# Patient Record
Sex: Female | Born: 1975 | Race: White | Hispanic: No | Marital: Married | Smoking: Never smoker
Health system: Southern US, Community
[De-identification: ages and names within clinical notes are randomized; demographics above are authoritative.]

## PROBLEM LIST (undated history)

## (undated) ENCOUNTER — Ambulatory Visit: Source: Home / Self Care

## (undated) DIAGNOSIS — Z9889 Other specified postprocedural states: Secondary | ICD-10-CM

## (undated) DIAGNOSIS — K5790 Diverticulosis of intestine, part unspecified, without perforation or abscess without bleeding: Secondary | ICD-10-CM

## (undated) DIAGNOSIS — R112 Nausea with vomiting, unspecified: Secondary | ICD-10-CM

## (undated) DIAGNOSIS — F329 Major depressive disorder, single episode, unspecified: Secondary | ICD-10-CM

## (undated) DIAGNOSIS — D509 Iron deficiency anemia, unspecified: Secondary | ICD-10-CM

## (undated) DIAGNOSIS — F32A Depression, unspecified: Secondary | ICD-10-CM

## (undated) DIAGNOSIS — F419 Anxiety disorder, unspecified: Secondary | ICD-10-CM

## (undated) DIAGNOSIS — K449 Diaphragmatic hernia without obstruction or gangrene: Secondary | ICD-10-CM

## (undated) DIAGNOSIS — K219 Gastro-esophageal reflux disease without esophagitis: Secondary | ICD-10-CM

## (undated) HISTORY — PX: COLONOSCOPY: SHX174

## (undated) HISTORY — DX: Major depressive disorder, single episode, unspecified: F32.9

## (undated) HISTORY — PX: TUBAL LIGATION: SHX77

## (undated) HISTORY — PX: ESOPHAGOGASTRODUODENOSCOPY: SHX1529

## (undated) HISTORY — DX: Iron deficiency anemia, unspecified: D50.9

## (undated) HISTORY — DX: Anxiety disorder, unspecified: F41.9

## (undated) HISTORY — DX: Depression, unspecified: F32.A

---
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
## 2017-07-08 ENCOUNTER — Ambulatory Visit (INDEPENDENT_AMBULATORY_CARE_PROVIDER_SITE_OTHER): Payer: BLUE CROSS/BLUE SHIELD | Admitting: Family Medicine

## 2017-07-08 ENCOUNTER — Encounter: Payer: Self-pay | Admitting: Family Medicine

## 2017-07-08 ENCOUNTER — Other Ambulatory Visit: Payer: Self-pay

## 2017-07-08 VITALS — BP 110/60 | HR 111 | Temp 98.1°F | Resp 16 | Ht 60.0 in | Wt 189.5 lb

## 2017-07-08 DIAGNOSIS — Z114 Encounter for screening for human immunodeficiency virus [HIV]: Secondary | ICD-10-CM

## 2017-07-08 DIAGNOSIS — Z79899 Other long term (current) drug therapy: Secondary | ICD-10-CM | POA: Diagnosis not present

## 2017-07-08 DIAGNOSIS — Z6835 Body mass index (BMI) 35.0-35.9, adult: Secondary | ICD-10-CM

## 2017-07-08 DIAGNOSIS — E669 Obesity, unspecified: Secondary | ICD-10-CM | POA: Diagnosis not present

## 2017-07-08 DIAGNOSIS — F321 Major depressive disorder, single episode, moderate: Secondary | ICD-10-CM | POA: Insufficient documentation

## 2017-07-08 DIAGNOSIS — F339 Major depressive disorder, recurrent, unspecified: Secondary | ICD-10-CM | POA: Diagnosis not present

## 2017-07-08 DIAGNOSIS — Z113 Encounter for screening for infections with a predominantly sexual mode of transmission: Secondary | ICD-10-CM | POA: Diagnosis not present

## 2017-07-08 DIAGNOSIS — R5383 Other fatigue: Secondary | ICD-10-CM | POA: Diagnosis not present

## 2017-07-08 DIAGNOSIS — F419 Anxiety disorder, unspecified: Secondary | ICD-10-CM | POA: Insufficient documentation

## 2017-07-08 HISTORY — DX: Encounter for screening for human immunodeficiency virus (HIV): Z11.4

## 2017-07-08 HISTORY — DX: Other long term (current) drug therapy: Z79.899
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
## 2017-07-09 LAB — LIPID PANEL
Cholesterol: 136 mg/dL (ref <200)
HDL: 52 mg/dL (ref >50)
LDL Cholesterol (Calc): 68 mg/dL (calc)
Non-HDL Cholesterol (Calc): 84 mg/dL (calc) (ref <130)
Total CHOL/HDL Ratio: 2.6 (calc) (ref <5.0)
Triglycerides: 79 mg/dL (ref <150)

## 2017-07-09 LAB — COMPLETE METABOLIC PANEL WITH GFR
AG Ratio: 1.6 (calc) (ref 1.0–2.5)
ALBUMIN MSPROF: 3.9 g/dL (ref 3.6–5.1)
ALT: 17 U/L (ref 6–29)
AST: 12 U/L (ref 10–30)
Alkaline phosphatase (APISO): 68 U/L (ref 33–115)
BUN: 12 mg/dL (ref 7–25)
CALCIUM: 9.1 mg/dL (ref 8.6–10.2)
CO2: 29 mmol/L (ref 20–32)
CREATININE: 1 mg/dL (ref 0.50–1.10)
Chloride: 109 mmol/L (ref 98–110)
GFR, EST NON AFRICAN AMERICAN: 70 mL/min/{1.73_m2} (ref >=60)
GFR, Est African American: 81 mL/min/{1.73_m2} (ref >=60)
GLOBULIN: 2.5 g/dL (ref 1.9–3.7)
Glucose, Bld: 76 mg/dL (ref 65–139)
Potassium: 4 mmol/L (ref 3.5–5.3)
SODIUM: 141 mmol/L (ref 135–146)
Total Bilirubin: 0.5 mg/dL (ref 0.2–1.2)
Total Protein: 6.4 g/dL (ref 6.1–8.1)

## 2017-07-09 LAB — HEPATIC FUNCTION PANEL
AG Ratio: 1.6 (calc) (ref 1.0–2.5)
ALBUMIN MSPROF: 3.9 g/dL (ref 3.6–5.1)
ALT: 17 U/L (ref 6–29)
AST: 12 U/L (ref 10–30)
Alkaline phosphatase (APISO): 68 U/L (ref 33–115)
Bilirubin, Direct: 0.1 mg/dL (ref 0.0–0.2)
Globulin: 2.5 g/dL (calc) (ref 1.9–3.7)
Indirect Bilirubin: 0.4 mg/dL (calc) (ref 0.2–1.2)
Total Bilirubin: 0.5 mg/dL (ref 0.2–1.2)
Total Protein: 6.4 g/dL (ref 6.1–8.1)

## 2017-07-09 LAB — CBC WITH DIFFERENTIAL/PLATELET
Basophils Absolute: 72 cells/uL (ref 0–200)
Basophils Relative: 1.1 %
EOS PCT: 2.9 %
Eosinophils Absolute: 189 cells/uL (ref 15–500)
HEMATOCRIT: 41.9 % (ref 35.0–45.0)
HEMOGLOBIN: 14.1 g/dL (ref 11.7–15.5)
LYMPHS ABS: 2100 {cells}/uL (ref 850–3900)
MCH: 29.7 pg (ref 27.0–33.0)
MCHC: 33.7 g/dL (ref 32.0–36.0)
MCV: 88.2 fL (ref 80.0–100.0)
MONOS PCT: 8.5 %
MPV: 11.2 fL (ref 7.5–12.5)
Neutro Abs: 3588 cells/uL (ref 1500–7800)
Neutrophils Relative %: 55.2 %
Platelets: 253 10*3/uL (ref 140–400)
RBC: 4.75 10*6/uL (ref 3.80–5.10)
RDW: 12.2 % (ref 11.0–15.0)
Total Lymphocyte: 32.3 %
WBC mixed population: 553 cells/uL (ref 200–950)
WBC: 6.5 10*3/uL (ref 3.8–10.8)

## 2017-07-09 LAB — RPR: RPR Ser Ql: NONREACTIVE

## 2017-07-09 LAB — HEPATITIS PANEL, ACUTE
HEP A IGM: NONREACTIVE
HEP B C IGM: NONREACTIVE
HEP C AB: NONREACTIVE
Hepatitis B Surface Ag: NONREACTIVE
SIGNAL TO CUT-OFF: 0.01 (ref <1.00)

## 2017-07-09 LAB — VITAMIN B12: VITAMIN B 12: 427 pg/mL (ref 200–1100)

## 2017-07-09 LAB — TSH: TSH: 2.01 m[IU]/L

## 2017-07-09 LAB — VITAMIN D 25 HYDROXY (VIT D DEFICIENCY, FRACTURES): Vit D, 25-Hydroxy: 24 ng/mL — ABNORMAL LOW (ref 30–100)

## 2017-07-09 LAB — HIV ANTIBODY (ROUTINE TESTING W REFLEX): HIV 1&2 Ab, 4th Generation: NONREACTIVE

## 2017-07-12 ENCOUNTER — Telehealth (HOSPITAL_COMMUNITY): Payer: Self-pay

## 2017-07-12 NOTE — Telephone Encounter (Signed)
[facility]  by Dr. Vanetta ShawlHisada through staff email.    Patient was taking her son to the barber shop and was not able to speak.  Patient reports that I can call ber back tomorrow.

## 2017-07-14 ENCOUNTER — Encounter: Payer: Self-pay | Admitting: Family Medicine

## 2017-07-28 ENCOUNTER — Telehealth: Payer: Self-pay

## 2017-07-28 NOTE — Telephone Encounter (Signed)
[facility] - left message.  

## 2017-08-10 ENCOUNTER — Telehealth (HOSPITAL_COMMUNITY): Payer: Self-pay

## 2017-08-10 NOTE — Telephone Encounter (Signed)
[facility] - left message.  

## 2017-08-19 ENCOUNTER — Encounter: Payer: BLUE CROSS/BLUE SHIELD | Admitting: Family Medicine

## 2017-09-01 ENCOUNTER — Telehealth: Payer: Self-pay

## 2017-09-01 NOTE — Telephone Encounter (Signed)
[facility] - left message.  

## 2017-09-14 ENCOUNTER — Telehealth: Payer: Self-pay | Admitting: Clinical

## 2017-09-14 NOTE — Telephone Encounter (Signed)
Patricia Becker called returning call from Patricia Becker about [facility] services.  This [facility] specialist informed her that Patricia not available but this [facility] specialist could assist her.  Patricia Becker is interested in services for ongoing therapy and medication management.  Patricia Becker is not available to complete initial intake assessment since she is driving right now.  This [facility] specialist scheduled phone appointment this Friday between 11am/11:30am 09/17/17 to complete initial assessment.

## 2017-09-17 ENCOUNTER — Encounter (INDEPENDENT_AMBULATORY_CARE_PROVIDER_SITE_OTHER): Payer: Self-pay

## 2017-09-17 ENCOUNTER — Telehealth: Payer: Self-pay | Admitting: Clinical

## 2017-09-17 DIAGNOSIS — F339 Major depressive disorder, recurrent, unspecified: Secondary | ICD-10-CM

## 2017-09-17 DIAGNOSIS — F419 Anxiety disorder, unspecified: Secondary | ICD-10-CM
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
## 2017-09-20 ENCOUNTER — Encounter (HOSPITAL_COMMUNITY): Payer: Self-pay | Admitting: Psychiatry

## 2017-09-22 NOTE — Telephone Encounter (Signed)
This encounter was created in error - please disregard.
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
## 2017-09-23 ENCOUNTER — Encounter (INDEPENDENT_AMBULATORY_CARE_PROVIDER_SITE_OTHER): Payer: Self-pay

## 2017-09-30 ENCOUNTER — Encounter (INDEPENDENT_AMBULATORY_CARE_PROVIDER_SITE_OTHER): Payer: Self-pay

## 2017-09-30 ENCOUNTER — Telehealth: Payer: Self-pay | Admitting: Clinical

## 2017-09-30 DIAGNOSIS — F419 Anxiety disorder, unspecified: Secondary | ICD-10-CM

## 2017-09-30 DIAGNOSIS — F339 Major depressive disorder, recurrent, unspecified: Secondary | ICD-10-CM

## 2017-09-30 NOTE — BH Specialist Note (Signed)
[facility] Telephone Follow-up  MRN: [id number] NAME: Patricia Becker Date: 09/30/17  Start time: 9:19am End time: 9:29am Total time: 10 min Call number: 2/6  Reason for call today: Reason for Contact: PHQ9-2 weeks  PHQ-9 Scores:  Depression screen [facility] 2/9 09/17/2017 07/08/2017  Decreased Interest 1 0  Down, Depressed, Hopeless 1 1  PHQ - 2 Score 2 1  Altered sleeping 1 -  Tired, decreased energy 2 -  Change in appetite 2 -  Feeling bad or failure about yourself  2 -  Trouble concentrating 2 -  Moving slowly or fidgety/restless 1 -  Suicidal thoughts 0 -  PHQ-9 Score 12 -  Difficult doing work/chores Very difficult -   GAD-7 Scores:  GAD 7 : Generalized Anxiety Score 09/17/2017  Nervous, Anxious, on Edge 3  Control/stop worrying 2  Worry too much - different things 3  Trouble relaxing 3  Restless 2  Easily annoyed or irritable 3  Afraid - awful might happen 1  Total GAD 7 Score 17  Anxiety Difficulty Somewhat difficult    Stress Current stressors: Current Stressors: Other (Comment)(Busy work & family schedule, difficulty practicing any techniques to help her relax) Sleep:   Appetite:   Coping ability:   Patient taking medications as prescribed: Patient taking medications as prescribed: Yes  Current medications:  Outpatient Encounter Medications as of 09/30/2017  Medication Sig  . b complex vitamins tablet Take 1 tablet by mouth daily.  Marland Kitchen buPROPion (WELLBUTRIN XL) 150 MG 24 hr tablet Take 150 mg by mouth every morning.  . busPIRone (BUSPAR) 5 MG tablet Take 5 mg by mouth 2 (two) times daily.  . citalopram (CELEXA) 40 MG tablet Take 40 mg by mouth daily.  . Multiple Vitamin (MULTIVITAMIN) tablet Take 1 tablet by mouth daily.  . naproxen sodium (ALEVE) 220 MG tablet Take 220 mg by mouth daily as needed.  . polyethylene glycol (MIRALAX / GLYCOLAX) packet Take 17 g by mouth daily as needed.   No facility-administered encounter medications on file as of  09/30/2017.     Goals, Interventions and Follow-up Plan Goals: Increase adequate support systems for patient/family  Goals: Increase adequate support systems for patient/family  Patient's Specific Goals  - getting on the right medication - psychotherapy - even 1x/mo - Possibly trauma therapy -healthier eating habits  Interventions: Link to [facility] Follow-up Plan: Refer to Psychiatrist for Medication Management  Summary:  Patricia Becker reported that she will not be able to make her appointment with Dr. Vanetta Shawl on 10/19/17 so this [facility] specialist assisted in rescheduling her appointment to Friday 10/29/17, arrival time at 10am,11am appointment with Dr. Vanetta Shawl.  Patricia Becker reported she continues to be stressed and takes things one day at a time.  She agreed to look at one of the grounding techniques sent to her through MyChart to try to practice.  Mykenzie Ebanks Ed Blalock, LCSW

## 2017-10-15 ENCOUNTER — Encounter: Payer: Self-pay | Admitting: Family Medicine

## 2017-10-18 ENCOUNTER — Telehealth: Payer: Self-pay

## 2017-10-18 NOTE — Telephone Encounter (Signed)
Writer left message on 10-13-2017 and 10-18-2017.

## 2017-10-19 ENCOUNTER — Ambulatory Visit (HOSPITAL_COMMUNITY): Payer: BLUE CROSS/BLUE SHIELD | Admitting: Psychiatry

## 2017-10-21 ENCOUNTER — Other Ambulatory Visit: Payer: Self-pay

## 2017-10-21 ENCOUNTER — Encounter: Payer: Self-pay | Admitting: Family Medicine

## 2017-10-21 ENCOUNTER — Ambulatory Visit (INDEPENDENT_AMBULATORY_CARE_PROVIDER_SITE_OTHER): Payer: BLUE CROSS/BLUE SHIELD | Admitting: Family Medicine

## 2017-10-21 VITALS — BP 114/72 | HR 88 | Temp 98.1°F | Resp 16 | Ht 60.0 in | Wt 190.0 lb

## 2017-10-21 DIAGNOSIS — Z79899 Other long term (current) drug therapy: Secondary | ICD-10-CM | POA: Diagnosis not present

## 2017-10-21 DIAGNOSIS — F339 Major depressive disorder, recurrent, unspecified: Secondary | ICD-10-CM | POA: Diagnosis not present

## 2017-10-21 DIAGNOSIS — F419 Anxiety disorder, unspecified: Secondary | ICD-10-CM | POA: Diagnosis not present

## 2017-10-21 NOTE — Progress Notes (Signed)
Patient ID: Patricia Becker, female    DOB: 1975/09/28, 42 y.o.   MRN: [id number]  Chief Complaint  Patient presents with  . Medication Management    Allergies Demerol [meperidine hcl] and Aspirin  Subjective:   Patricia Becker is a 42 y.o. female who presents to [facility] today.  HPI Patricia Becker presents today to discuss her mood.  She reports that she has been taking Wellbutrin XL 150 mg, Celexa 40 mg, and BuSpar 5 mg twice a day.  She reports that she initially did have several appointments with psychiatry but had to cancel them due to her work schedule.  She has an appointment scheduled for 621/2019 but is going to have to cancel that appointment.  She is still having some anxiety and depression and mood instability but her mood is better than it was in February, per her report and her husband's report.  She has been consistently taking her medicines for the most part but does reports she does occasionally miss the medication for several days.  She reports that when she misses all the medications that she feels very irritable and easily aggravated.  She denies any suicidal or homicidal ideations.  She has been diagnosed with anxiety, depression, and PTSD.  She still would like to see a psychiatrist but would like to work on weaning down some of the medication so she can be put on some new medications which may give her more benefit.  She is functioning well at work.  Her appetite is good.  She denies any auditory or visual hallucinations.  Her husband reports that he can tell when she misses her medications because she is more irritable.  When she misses the medication she does not just missed 1 of them but she misses all of them for several days.  She denies having any withdrawal symptoms from missing the doses.   Past Medical History:  Diagnosis Date  . Anxiety   . Depression     Past Surgical History:  Procedure Laterality Date  . CESAREAN SECTION    . COLONOSCOPY     2017, normal.   . KNEE SURGERY  1981  . TUBAL LIGATION      Family History  Problem Relation Age of Onset  . Colon polyps Mother   . Suicidality Father   . Hypertension Sister   . Colon cancer Maternal Grandmother   . Drug abuse Other      Social History   Socioeconomic History  . Marital status: Married    Spouse name: Not on file  . Number of children: Not on file  . Years of education: Not on file  . Highest education level: Not on file  Occupational History  . Not on file  Social Needs  . Financial resource strain: Not on file  . Food insecurity:    Worry: Not on file    Inability: Not on file  . Transportation needs:    Medical: Not on file    Non-medical: Not on file  Tobacco Use  . Smoking status: Never Smoker  . Smokeless tobacco: Never Used  Substance and Sexual Activity  . Alcohol use: Yes    Alcohol/week: 1.2 oz    Types: 2 Glasses of wine per week  . Drug use: No  . Sexual activity: Yes    Birth control/protection: Surgical  Lifestyle  . Physical activity:    Days per week: Not on file    Minutes per session: Not on file  .  Stress: Not on file  Relationships  . Social connections:    Talks on phone: Not on file    Gets together: Not on file    Attends religious service: Not on file    Active member of club or organization: Not on file    Attends meetings of clubs or organizations: Not on file    Relationship status: Not on file  Other Topics Concern  . Not on file  Social History Narrative   [place] Had lived there for over 20 years. Moved with husband, son, and daughter.    Daugther is 42 y/o. Son is 42 y/o.   Works at [employer].    Was able to buy a house.    Married for over 16 years, second marriage, has two other children from that marriage.       Step father died, the actual father that raised her, on her birthday in 2016.    Eats all food groups.   Wears seatbelt.    Is a member of [employer].    Current Outpatient Medications on File Prior to Visit  Medication Sig Dispense Refill  . b complex vitamins tablet Take 1 tablet by mouth daily.    Marland Kitchen. buPROPion (WELLBUTRIN XL) 150 MG 24 hr tablet Take 150 mg by mouth every morning.  0  . busPIRone (BUSPAR) 5 MG tablet Take 5 mg by mouth 2 (two) times daily.    . citalopram (CELEXA) 40 MG tablet Take 40 mg by mouth daily.    . Multiple Vitamin (MULTIVITAMIN) tablet Take 1 tablet by mouth daily.    . naproxen sodium (ALEVE) 220 MG tablet Take 220 mg by mouth daily as needed.    . polyethylene glycol (MIRALAX / GLYCOLAX) packet Take 17 g by mouth daily as needed.     No current facility-administered medications on file prior to visit.     Review of Systems  Constitutional: Positive for fatigue. Negative for activity change, appetite change and fever.  Eyes: Negative for visual disturbance.  Respiratory: Negative for cough, chest tightness and shortness of breath.   Cardiovascular: Negative for chest pain, palpitations and leg swelling.  Gastrointestinal: Negative for abdominal pain, nausea and vomiting.  Genitourinary: Negative for dysuria, frequency and urgency.  Neurological: Negative for dizziness, syncope and light-headedness.  Hematological: Negative for adenopathy.  Psychiatric/Behavioral: Positive for agitation. Negative for behavioral problems, dysphoric mood, self-injury and suicidal ideas. The patient is nervous/anxious.      Objective:   BP 114/72 (BP Location: Left Arm, Patient Position: Sitting, Cuff Size: Large)   Pulse 88   Temp 98.1 F (36.7 C) (Temporal)   Resp 16   Ht 5' (1.524 m)   Wt 190 lb (86.2 kg)   SpO2 98%   BMI 37.11 kg/m   Physical Exam  Constitutional: She appears well-developed and well-nourished.  Cardiovascular: Normal rate, regular rhythm and normal heart sounds.  Pulmonary/Chest: Effort normal and breath sounds normal.  Skin: Skin is warm and dry. No rash noted.  Psychiatric: She  has a normal mood and affect. Her behavior is normal. Judgment and thought content normal.    Depression screen [facility] 2/9 10/21/2017 09/17/2017 07/08/2017  Decreased Interest 1 1 0  Down, Depressed, Hopeless 0 1 1  PHQ - 2 Score 1 2 1   Altered sleeping 1 1 -  Tired, decreased energy 1 2 -  Change in appetite 1 2 -  Feeling bad or failure about  yourself  1 2 -  Trouble concentrating 0 2 -  Moving slowly or fidgety/restless 1 1 -  Suicidal thoughts 0 0 -  PHQ-9 Score 6 12 -  Difficult doing work/chores Somewhat difficult Very difficult -    Assessment and Plan  1. Anxiety 2. Depression, recurrent (HCC) 3. Polypharmacy  Patient with past history of mood disorder on multiple medications.  Does not feel symptoms are at goal.  Her mood is stable at this time.  She will need to decrease the dosing of her current medications prior to starting a new medication.  We will go ahead and initiate this de-escalation of medications at this time.  She will be in close contact with our office via email or phone if she has any problems.  She will plan to contact me in 2 weeks and let me know how she is doing.  At this time we will go ahead and stop the Wellbutrin XL.  She is to continue the Celexa and the BuSpar.  In 2 weeks she will decrease the dose of BuSpar to 1/2 tablet twice a day.  She will then contact our office.  She is going to reschedule her psychiatry appointments and continue with the virtual behavioral health.  It is our goal for her to get into psychiatry.  She does have contact numbers if she has any abrupt changes in her mood or any problems prior to the office visit.  We discussed side effects of medication withdrawal and symptoms for her to be concerned about she voiced understanding.  She denies any suicidal/homicidal ideations.  She reports that she will call our office if she has any problems.  She will follow-up with me in 4 weeks if she does not have an appointment with psychiatry prior to  that.  Suicide risks evaluated and documented in note if present or in the area below.  Patient has protective factors of family and community support.  Patient reports that family believes is behaving rationally. Patient displays problem solving skills.   Patient specifically denies suicide ideation. Patient has access/information to healthcare contacts if situation or mood changes where patient is a risk to self or others or mood becomes unstable.   During the encounter, the patient had good eye contact and firm handshake regarding safety contract and agreement to seek help if mood worsens and not to harm self.   Patient understands the treatment plan and is in agreement. Agrees to keep follow up and call prior or return to clinic if needed.  Office visit was greater than 25 minutes.  Greater than 50% spent counseling and coordinating care. Return in about 1 month (around 11/18/2017) for folllow up. Aliene Beams, MD 10/21/2017

## 2017-10-21 NOTE — Patient Instructions (Signed)
Stop the Wellbutrin XL and then in 2 weeks if you are feeling well, no problems. Then decrease the buspar to 1/2 tablet twice a day. Then email or call me in 2 weeks and let me know how you are doing and when you psychiatry appointment is scheduled.

## 2017-10-29 ENCOUNTER — Telehealth: Payer: Self-pay | Admitting: Clinical

## 2017-10-29 ENCOUNTER — Encounter: Payer: Self-pay | Admitting: Family Medicine

## 2017-10-29 ENCOUNTER — Other Ambulatory Visit: Payer: Self-pay

## 2017-10-29 ENCOUNTER — Ambulatory Visit (HOSPITAL_COMMUNITY): Payer: BLUE CROSS/BLUE SHIELD | Admitting: Psychiatry

## 2017-10-29 MED ORDER — BUSPIRONE HCL 5 MG PO TABS: 5.0000 mg | ORAL_TABLET | Freq: Two times a day (BID) | ORAL | 2 refills | Status: DC

## 2017-10-29 MED ORDER — CITALOPRAM HYDROBROMIDE 40 MG PO TABS: 40.0000 mg | ORAL_TABLET | Freq: Every day | ORAL | 2 refills | Status: DC

## 2017-10-29 NOTE — Telephone Encounter (Signed)
This BH intern left message to call back with name and contact information. ° °Sudheera Ranaweera  °Behavioral Health Intern  °

## 2017-10-29 NOTE — Telephone Encounter (Signed)
Patient needs TDap and Polio vaccine for  employer, as well as Althea Grimmershihara Vison Test which need to be completed as soon as possible, She is scheduled for 12/02/17 (there is nothing sooner). Can all this be completed then ?  Also need refills on my Celexa and Buspar.  Please call with any concerns.  [phone].

## 2017-11-03 ENCOUNTER — Ambulatory Visit: Payer: Self-pay | Admitting: Family Medicine

## 2017-11-07 ENCOUNTER — Encounter: Payer: Self-pay | Admitting: Family Medicine

## 2017-11-17 ENCOUNTER — Telehealth: Payer: Self-pay

## 2017-11-17 NOTE — Telephone Encounter (Signed)
APPT with Dr Vanetta ShawlHisada on 11/29/2017.  Once appt is completed the patient will be on the inactive status with [facility]

## 2017-11-17 NOTE — Telephone Encounter (Signed)
[facility] - Left Msg 

## 2017-11-19 ENCOUNTER — Ambulatory Visit: Payer: BLUE CROSS/BLUE SHIELD | Admitting: Family Medicine

## 2017-11-19 ENCOUNTER — Encounter: Payer: Self-pay | Admitting: Family Medicine

## 2017-11-19 VITALS — BP 122/74 | HR 72 | Temp 98.0°F | Resp 14 | Ht 60.0 in | Wt 191.0 lb

## 2017-11-19 DIAGNOSIS — E669 Obesity, unspecified: Secondary | ICD-10-CM | POA: Diagnosis not present

## 2017-11-19 DIAGNOSIS — F339 Major depressive disorder, recurrent, unspecified: Secondary | ICD-10-CM

## 2017-11-19 DIAGNOSIS — F419 Anxiety disorder, unspecified: Secondary | ICD-10-CM

## 2017-11-19 MED ORDER — VENLAFAXINE HCL ER 75 MG PO CP24: 75.0000 mg | ORAL_CAPSULE | Freq: Every day | ORAL | 1 refills | Status: DC

## 2017-11-19 NOTE — Patient Instructions (Signed)
Decrease the celexa to 20 mg a day for 2 weeks. Then stop the celexa.  Then start the Effexor XR 75mg  a day, first thing in the morning. Follow up in 4-6 weeks or sooner if needed. Call me if you have any problems.

## 2017-11-19 NOTE — Progress Notes (Signed)
Patient ID: Patricia Becker, female    DOB: 04/27/1976, 42 y.o.   MRN: [id number]  Chief Complaint  Patient presents with  . Follow-up    Allergies Demerol [meperidine hcl] and Aspirin  Subjective:   Patricia Becker is a 42 y.o. female who [facility].  HPI Here for follow up of her mood.  Since her last visit she going has titrated off the Wellbutrin and now is on just the celexa and the buspar prn. Reports that actually feels better off the Wellbutrin. Does not feel as anxious or agitated now that she is off the Wellbutrin. Trying to exercise each day. Trying to eat healthy. Has gained a lot of weight on the celexa. Would like to go ahead and try to titrate off the celexa in anticipation of trying a new medication. Is supposed to follow up with psychiatry next week but is going to have to cancel the appointment due to work reasons.  Is going to reschedule.  Would like to get the evaluation by psychiatry because she is always wondered if she has bipolar disorder.  She does not endorse any manic behavior.  She reports when she was on the Wellbutrin that she felt like she fluctuated with her moods more and it made her somewhat irritable.  Denies any suicidal or homicidal ideations.  No auditory visual hallucinations.  Has not had any headaches.  Vision is been normal.  Reports she is overall felt better.  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  Is able to concentrate and complete her activities with her job.   Past Medical History:  Diagnosis Date  . Anxiety   . Depression     Past Surgical History:  Procedure Laterality Date  . CESAREAN SECTION    . COLONOSCOPY     2017, normal.   . KNEE SURGERY  1981  . TUBAL LIGATION      Family History  Problem Relation Age of Onset  . Colon polyps Mother   . Suicidality Father   . Hypertension Sister   . Colon cancer Maternal Grandmother   . Drug abuse Other      Social History    Socioeconomic History  . Marital status: Married    Spouse name: Not on file  . Number of children: Not on file  . Years of education: Not on file  . Highest education level: Not on file  Occupational History  . Not on file  Social Needs  . Financial resource strain: Not on file  . Food insecurity:    Worry: Not on file    Inability: Not on file  . Transportation needs:    Medical: Not on file    Non-medical: Not on file  Tobacco Use  . Smoking status: Never Smoker  . Smokeless tobacco: Never Used  Substance and Sexual Activity  . Alcohol use: Yes    Alcohol/week: 1.2 oz    Types: 2 Glasses of wine per week  . Drug use: No  . Sexual activity: Yes    Birth control/protection: Surgical  Lifestyle  . Physical activity:    Days per week: Not on file    Minutes per session: Not on file  . Stress: Not on file  Relationships  . Social connections:    Talks on phone: Not on file    Gets together: Not on file    Attends religious service: Not on file  Active member of club or organization: Not on file    Attends meetings of clubs or organizations: Not on file    Relationship status: Not on file  Other Topics Concern  . Not on file  Social History Narrative   Moved from [place], [place] in 01/2017. Had lived there for over 20 years. Moved with husband, son, and daughter.    Daugther is 52 y/o. Son is 12 y/o.   Works at [employer].    Was able to buy a house.    Married for over 16 years, second marriage, has two other children from that marriage.       Step father died, the actual father that raised her, on her birthday in 2016.    Eats all food groups.   Wears seatbelt.    Is a member of [employer].    Current Outpatient Medications on File Prior to Visit  Medication Sig Dispense Refill  . b complex vitamins tablet Take 1 tablet by mouth daily.    . citalopram (CELEXA) 40 MG tablet Take 1 tablet (40 mg total) by mouth daily. 30 tablet 2  . Multiple  Vitamin (MULTIVITAMIN) tablet Take 1 tablet by mouth daily.    . naproxen sodium (ALEVE) 220 MG tablet Take 220 mg by mouth daily as needed.    . polyethylene glycol (MIRALAX / GLYCOLAX) packet Take 17 g by mouth daily as needed.     No current facility-administered medications on file prior to visit.     Review of Systems  Constitutional: Negative for activity change, appetite change and fever.  Eyes: Negative for visual disturbance.  Respiratory: Negative for cough, chest tightness and shortness of breath.   Cardiovascular: Negative for chest pain, palpitations and leg swelling.  Gastrointestinal: Negative for abdominal pain, nausea and vomiting.  Genitourinary: Negative for dysuria, frequency and urgency.  Skin: Negative for rash.  Neurological: Negative for dizziness, tremors, syncope, facial asymmetry, speech difficulty, weakness, light-headedness, numbness and headaches.  Hematological: Negative for adenopathy.  Psychiatric/Behavioral: Positive for dysphoric mood. Negative for agitation, behavioral problems, confusion, decreased concentration, hallucinations, self-injury, sleep disturbance and suicidal ideas. The patient is nervous/anxious. The patient is not hyperactive.        Patient reports that it is hard for her to articulate but she does not feel that her mood is at goal of where it should be.  She reports she could use some improvement in her mood.  She occasionally feels down.  She does feel anxious at times.  Can tend to get a little bit agitated.  Reports she does have some OCD tendencies.  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     Objective:   BP 122/74 (BP Location: Left Arm, Patient Position: Sitting)   Pulse 72   Temp 98 F (36.7 C) (Temporal)   Resp 14   Ht 5' (1.524 m)   Wt 191 lb (86.6 kg)   SpO2 97%    BMI 37.30 kg/m   Physical Exam  Constitutional: She appears well-developed and well-nourished.  HENT:  Head: Normocephalic and atraumatic.  Cardiovascular: Normal rate, regular rhythm and normal heart sounds.  Pulmonary/Chest: Effort normal and breath sounds normal.  Skin: Skin is warm and dry. Capillary refill takes less than 2 seconds.  Psychiatric: Her speech is normal and behavior  is normal. Judgment and thought content normal. Her mood appears not anxious. Her affect is not angry, not blunt and not labile. She is not actively hallucinating. Cognition and memory are normal. She does not exhibit a depressed mood. She expresses no homicidal and no suicidal ideation. She expresses no suicidal plans and no homicidal plans.  Patient's mood seems calmer and less labile.  She is appropriate.  Thought process is goal-directed without delusions, phobias, obsessions or compulsions. She is attentive.  Vitals reviewed.  Depression screen [facility] 2/9 11/19/2017 10/21/2017 09/17/2017 07/08/2017  Decreased Interest 1 1 1  0  Down, Depressed, Hopeless 1 0 1 1  PHQ - 2 Score 2 1 2 1   Altered sleeping 0 1 1 -  Tired, decreased energy 0 1 2 -  Change in appetite 0 1 2 -  Feeling bad or failure about yourself  1 1 2  -  Trouble concentrating 0 0 2 -  Moving slowly or fidgety/restless 0 1 1 -  Suicidal thoughts 0 0 0 -  PHQ-9 Score 3 6 12  -  Difficult doing work/chores Somewhat difficult Somewhat difficult Very difficult -    Assessment and Plan  1. Anxiety Long discussion with patient today regarding her medications.  She is actually had improvement in her mood and feels more stable without the Wellbutrin.  She does wish to continue to work towards getting off the Celexa due to the fact that she does not feel it has improved her mood and that she has had a tremendous weight gain while on this medication.  Today we discussed different options for medications based on her previous mood diagnoses of depression and  anxiety.  We discussed SSRIsand SNRIs.  We discussed the mechanism of action, side effects, most common adverse reactions, and discussed the medicines in detail.  She has previously been treated with Paxil, Zoloft, Celexa, Wellbutrin.  She did not feel that Zoloft or Paxil helped and she had significant weight gain on the Celexa.  She never felt much improvement on Wellbutrin and felt that it actually might have been exacerbating her mood problems.  She is also been on BuSpar but tends to take it irregularly and sporadically.  Therefore, at this time we will decrease her Celexa to 20 mg a day for the next 2 weeks.  She will then discontinue the Celexa and start Effexor XR 75 mg a day.  She will keep her follow-up with psychiatry or she will plan on rescheduling her appointment with psychiatry since she cannot go next week.  She will call with any questions or concerns.  She was counseled regarding risks versus benefits of Effexor.  She was counseled regarding the need for compliance with this medication on a daily basis and to take it at the same time.  Patient has protective factors of family and community support.  Patient reports that family believes is behaving rationally. Patient displays problem solving skills.   Patient specifically denies suicide ideation. Patient has access/information to healthcare contacts if situation or mood changes where patient is a risk to self or others or mood becomes unstable.   During the encounter, the patient had good eye contact and firm handshake regarding safety contract and agreement to seek help if mood worsens and not to harm self.   Patient understands the treatment plan and is in agreement. Agrees to keep follow up and call prior or return to clinic if needed.   - venlafaxine XR (EFFEXOR XR) 75 MG 24 hr capsule; Take  1 capsule (75 mg total) by mouth daily with breakfast.  Dispense: 30 capsule; Refill: 1  Diet, exercise, and weight loss modifications  recommended.  We discussed lifestyle modifications which can help to decrease anxiety symptoms.  Office visit was 25 minutes.  Greater than 50% of office visit was spent counseling and coordinating care. If patient is not able to tolerate the Effexor Exar and she has not gotten into psychiatry our next alternative would be to consider Prozac. Return in about 6 weeks (around 12/31/2017) for follow up. Aliene Beams, MD 11/19/2017

## 2017-11-25 NOTE — Progress Notes (Deleted)
Psychiatric Initial Adult Assessment   Patient Identification: Patricia Becker MRN:  [id number] Date of Evaluation:  11/25/2017 Referral Source: *** Chief Complaint:   Visit Diagnosis: No diagnosis found.  History of Present Illness:   Patricia Becker is a 42 y.o. year old female with a history of , who presents for follow up appointment for No diagnosis found.  Bipolar disorder  Associated Signs/Symptoms: Depression Symptoms:  {DEPRESSION SYMPTOMS:20000} (Hypo) Manic Symptoms:  {BHH MANIC SYMPTOMS:22872} Anxiety Symptoms:  {BHH ANXIETY SYMPTOMS:22873} Psychotic Symptoms:  {BHH PSYCHOTIC SYMPTOMS:22874} PTSD Symptoms: {BHH PTSD SYMPTOMS:22875}  Past Psychiatric History: ***  Previous Psychotropic Medications: {YES/NO:21197}  Substance Abuse History in the last 12 months:  {yes no:314532}  Consequences of Substance Abuse: {BHH CONSEQUENCES OF SUBSTANCE ABUSE:22880}  Past Medical History:  Past Medical History:  Diagnosis Date  . Anxiety   . Depression     Past Surgical History:  Procedure Laterality Date  . CESAREAN SECTION    . COLONOSCOPY     2017, normal.   . KNEE SURGERY  1981  . TUBAL LIGATION      Family Psychiatric History: ***  Family History:  Family History  Problem Relation Age of Onset  . Colon polyps Mother   . Suicidality Father   . Hypertension Sister   . Colon cancer Maternal Grandmother   . Drug abuse Other     Social History:   Social History   Socioeconomic History  . Marital status: Married    Spouse name: Not on file  . Number of children: Not on file  . Years of education: Not on file  . Highest education level: Not on file  Occupational History  . Not on file  Social Needs  . Financial resource strain: Not on file  . Food insecurity:    Worry: Not on file    Inability: Not on file  . Transportation needs:    Medical: Not on file    Non-medical: Not on file  Tobacco Use  . Smoking status: Never Smoker  . Smokeless  tobacco: Never Used  Substance and Sexual Activity  . Alcohol use: Yes    Alcohol/week: 1.2 oz    Types: 2 Glasses of wine per week  . Drug use: No  . Sexual activity: Yes    Birth control/protection: Surgical  Lifestyle  . Physical activity:    Days per week: Not on file    Minutes per session: Not on file  . Stress: Not on file  Relationships  . Social connections:    Talks on phone: Not on file    Gets together: Not on file    Attends religious service: Not on file    Active member of club or organization: Not on file    Attends meetings of clubs or organizations: Not on file    Relationship status: Not on file  Other Topics Concern  . Not on file  Social History Narrative   [place], [place] in 01[place]. Had lived there for over 20 years. Moved with husband, son, and daughter.    Daugther is 42 y/o. Son is 416 y/o.   Works at [employer].    Was able to buy a house.    Married for over 16 years, second marriage, has two other children from that marriage.       Step father died, the actual father that raised her, on her birthday in 2016.    Eats all food groups.   Wears seatbelt.  Is a member of [employer].     Additional Social History: ***  Allergies:   Allergies  Allergen Reactions  . Demerol [Meperidine Hcl] Anaphylaxis  . Aspirin Other (See Comments)    Upset stomach    Metabolic Disorder Labs: No results found for: HGBA1C, MPG No results found for: PROLACTIN Lab Results  Component Value Date   CHOL 136 07/08/2017   TRIG 79 07/08/2017   HDL 52 07/08/2017   CHOLHDL 2.6 07/08/2017   LDLCALC 68 07/08/2017     Current Medications: Current Outpatient Medications  Medication Sig Dispense Refill  . b complex vitamins tablet Take 1 tablet by mouth daily.    . citalopram (CELEXA) 40 MG tablet Take 1 tablet (40 mg total) by mouth daily. 30 tablet 2  . Multiple Vitamin (MULTIVITAMIN) tablet Take 1 tablet by mouth daily.    . naproxen  sodium (ALEVE) 220 MG tablet Take 220 mg by mouth daily as needed.    . polyethylene glycol (MIRALAX / GLYCOLAX) packet Take 17 g by mouth daily as needed.    . venlafaxine XR (EFFEXOR XR) 75 MG 24 hr capsule Take 1 capsule (75 mg total) by mouth daily with breakfast. 30 capsule 1   No current facility-administered medications for this visit.     Neurologic: Headache: {BHH YES OR NO:22294} Seizure: {BHH YES OR NO:22294} Paresthesias:{BHH YES OR GE:95284}  Musculoskeletal: Strength & Muscle Tone: {desc; muscle tone:32375} Gait & Station: {PE GAIT ED XLKG:40102} Patient leans: {Patient Leans:21022755}  Psychiatric Specialty Exam: ROS  There were no vitals taken for this visit.There is no height or weight on file to calculate BMI.  General Appearance: {Appearance:22683}  Eye Contact:  {BHH EYE CONTACT:22684}  Speech:  {Speech:22685}  Volume:  {Volume (PAA):22686}  Mood:  {BHH MOOD:22306}  Affect:  {Affect (PAA):22687}  Thought Process:  {Thought Process (PAA):22688}  Orientation:  {BHH ORIENTATION (PAA):22689}  Thought Content:  {Thought Content:22690}  Suicidal Thoughts:  {ST/HT (PAA):22692}  Homicidal Thoughts:  {ST/HT (PAA):22692}  Memory:  {BHH MEMORY:22881}  Judgement:  {Judgement (PAA):22694}  Insight:  {Insight (PAA):22695}  Psychomotor Activity:  {Psychomotor (PAA):22696}  Concentration:  {Concentration:21399}  Recall:  {BHH GOOD/FAIR/POOR:22877}  Fund of Knowledge:{BHH GOOD/FAIR/POOR:22877}  Language: {BHH GOOD/FAIR/POOR:22877}  Akathisia:  {BHH YES OR NO:22294}  Handed:  {Handed:22697}  AIMS (if indicated):  ***  Assets:  {Assets (PAA):22698}  ADL's:  {BHH VOZ'D:66440}  Cognition: {chl bhh cognition:304700322}  Sleep:  ***    Treatment Plan Summary: {CHL AMB BH MD TX HKVQ:2595638756}   Neysa Hotter, MD 7/18/201911:46 AM

## 2017-11-26 ENCOUNTER — Encounter: Payer: Self-pay | Admitting: Family Medicine

## 2017-11-29 ENCOUNTER — Ambulatory Visit (HOSPITAL_COMMUNITY): Payer: BLUE CROSS/BLUE SHIELD | Admitting: Psychiatry

## 2017-12-02 ENCOUNTER — Ambulatory Visit: Payer: Self-pay | Admitting: Family Medicine

## 2017-12-03 ENCOUNTER — Encounter: Payer: Self-pay | Admitting: Family Medicine

## 2017-12-13 ENCOUNTER — Telehealth: Payer: Self-pay

## 2017-12-13 NOTE — Telephone Encounter (Signed)
[facility] - Patient cancelled appt with Dr. Vanetta ShawlHisada.  Writer left apt to re-schedule

## 2017-12-15 ENCOUNTER — Telehealth: Payer: Self-pay

## 2017-12-15 NOTE — Telephone Encounter (Signed)
[facility] - Left Msg 

## 2017-12-17 ENCOUNTER — Encounter: Payer: Self-pay | Admitting: Family Medicine

## 2017-12-21 MED ORDER — FLUOXETINE HCL 20 MG PO TABS: 20.0000 mg | ORAL_TABLET | Freq: Every day | ORAL | 3 refills | Status: DC

## 2017-12-30 ENCOUNTER — Telehealth: Payer: Self-pay

## 2017-12-30 NOTE — Telephone Encounter (Signed)
Several attempts have been made to contact patient without success. Patient will be placed on the inactive list.  If services are needed again.  Please contact [facility] at [phone].    Information will be routed to the PCP and Dr. Hisada  

## 2017-12-31 ENCOUNTER — Ambulatory Visit: Payer: Self-pay | Admitting: Family Medicine

## 2018-01-11 ENCOUNTER — Ambulatory Visit (INDEPENDENT_AMBULATORY_CARE_PROVIDER_SITE_OTHER): Payer: BLUE CROSS/BLUE SHIELD | Admitting: Family Medicine

## 2018-01-11 ENCOUNTER — Encounter: Payer: Self-pay | Admitting: Family Medicine

## 2018-01-11 ENCOUNTER — Other Ambulatory Visit: Payer: Self-pay

## 2018-01-11 VITALS — BP 120/64 | HR 88 | Temp 98.7°F | Resp 12 | Ht 60.0 in | Wt 192.0 lb

## 2018-01-11 DIAGNOSIS — F339 Major depressive disorder, recurrent, unspecified: Secondary | ICD-10-CM

## 2018-01-11 DIAGNOSIS — Z23 Encounter for immunization: Secondary | ICD-10-CM | POA: Diagnosis not present

## 2018-01-11 DIAGNOSIS — F419 Anxiety disorder, unspecified: Secondary | ICD-10-CM

## 2018-01-11 MED ORDER — FLUOXETINE HCL 40 MG PO CAPS: 40.0000 mg | ORAL_CAPSULE | Freq: Every day | ORAL | 3 refills | Status: DC

## 2018-01-11 NOTE — Progress Notes (Signed)
Patient ID: Patricia Becker, female    DOB: 1975-08-17, 42 y.o.   MRN: [id number]  Chief Complaint  Patient presents with  . Anxiety    follow up. patient doesn't feel like it is any better  . Depression    this is better    Allergies Demerol [meperidine hcl] and Aspirin  Subjective:   Patricia Becker is a 42 y.o. female who presents to [facility] today.  HPI Patricia Becker presents today for follow-up of her mood.  Has weaned off BuSpar, Wellbutrin, and Effexor.  She is only taking the prozac 20 mg a day, has been on it for 3 weeks.  This was initiated after she did not feel improvement with the Effexor.  She reports that she does feel less depressed, down, and hopeless.  She feels like her mood is pretty good. She reports she does feel more anxious and does not feel as depressed. Feels irritable.  Reports that she does not feel anxious or stressed at work  but at home.  Has not been exercising.  Denies any suicidal or homicidal ideation.  No auditory or visual hallucinations.  Appetite is good.  Has not been exercising at all.  No panic attacks. No manic behavior. Feels high strung and mean/irritable. No side effects with the prozac.  Has not followed up with behavioral health.  Reports that because of her work schedule she has difficulty with scheduling appointments so far in advance because she does not get her schedule until 1 to 2 weeks prior.   Past Medical History:  Diagnosis Date  . Anxiety   . Depression     Past Surgical History:  Procedure Laterality Date  . CESAREAN SECTION    . COLONOSCOPY     2017, normal.   . KNEE SURGERY  1981  . TUBAL LIGATION      Family History  Problem Relation Age of Onset  . Colon polyps Mother   . Suicidality Father   . Hypertension Sister   . Colon cancer Maternal Grandmother   . Drug abuse Other      Social History   Socioeconomic History  . Marital status: Married    Spouse name: Not on file  . Number of  children: Not on file  . Years of education: Not on file  . Highest education level: Not on file  Occupational History  . Not on file  Social Needs  . Financial resource strain: Not on file  . Food insecurity:    Worry: Not on file    Inability: Not on file  . Transportation needs:    Medical: Not on file    Non-medical: Not on file  Tobacco Use  . Smoking status: Never Smoker  . Smokeless tobacco: Never Used  Substance and Sexual Activity  . Alcohol use: Yes    Alcohol/week: 2.0 standard drinks    Types: 2 Glasses of wine per week  . Drug use: No  . Sexual activity: Yes    Birth control/protection: Surgical  Lifestyle  . Physical activity:    Days per week: Not on file    Minutes per session: Not on file  . Stress: Not on file  Relationships  . Social connections:    Talks on phone: Not on file    Gets together: Not on file    Attends religious service: Not on file    Active member of club or organization: Not on file    Attends meetings of clubs  or organizations: Not on file    Relationship status: Not on file  Other Topics Concern  . Not on file  Social History Narrative   Moved from [place], [place] in 01/2017. Had lived there for over 20 years. Moved with husband, son, and daughter.    Daugther is 16 y/o. Son is 7 y/o.   Works at [employer].    Was able to buy a house.    Married for over 16 years, second marriage, has two other children from that marriage.       Step father died, the actual father that raised her, on her birthday in 2016.    Eats all food groups.   Wears seatbelt.    Is a member of [employer].    Current Outpatient Medications on File Prior to Visit  Medication Sig Dispense Refill  . b complex vitamins tablet Take 1 tablet by mouth daily.    . Multiple Vitamin (MULTIVITAMIN) tablet Take 1 tablet by mouth daily.    . polyethylene glycol (MIRALAX / GLYCOLAX) packet Take 17 g by mouth daily as needed.     No current  facility-administered medications on file prior to visit.     Review of Systems  Constitutional: Negative for activity change, appetite change, fatigue, fever and unexpected weight change.  Respiratory: Negative for cough and shortness of breath.   Cardiovascular: Negative for chest pain and palpitations.  Gastrointestinal: Negative for abdominal pain, constipation and diarrhea.  Skin: Negative for rash.  Psychiatric/Behavioral: Positive for agitation. Negative for behavioral problems, confusion, dysphoric mood, hallucinations, self-injury, sleep disturbance and suicidal ideas. The patient is nervous/anxious.      Objective:   BP 120/64 (BP Location: Right Arm, Patient Position: Sitting, Cuff Size: Large)   Pulse 88   Temp 98.7 F (37.1 C) (Temporal)   Resp 12   Ht 5' (1.524 m)   Wt 192 lb (87.1 kg)   SpO2 99% Comment: room air  BMI 37.50 kg/m   Physical Exam  Constitutional: She appears well-developed and well-nourished.  Cardiovascular: Normal rate, regular rhythm and normal heart sounds.  Pulmonary/Chest: Effort normal and breath sounds normal. No respiratory distress. She exhibits no tenderness.  Psychiatric: Her behavior is normal. Judgment and thought content normal. Her mood appears anxious. Her affect is not blunt and not labile. Her speech is not rapid and/or pressured. Cognition and memory are normal. She does not express impulsivity or inappropriate judgment. She does not exhibit a depressed mood. She expresses no homicidal and no suicidal ideation. She expresses no suicidal plans and no homicidal plans.  Vitals reviewed.   Depression screen [facility] 2/9 01/11/2018 11/19/2017 10/21/2017 09/17/2017 07/08/2017  Decreased Interest 0 1 1 1  0  Down, Depressed, Hopeless 0 1 0 1 1  PHQ - 2 Score 0 2 1 2 1   Altered sleeping 2 0 1 1 -  Tired, decreased energy 1 0 1 2 -  Change in appetite 2 0 1 2 -  Feeling bad or failure about yourself  1 1 1 2  -  Trouble concentrating 2 0 0 2 -    Moving slowly or fidgety/restless 3 0 1 1 -  Suicidal thoughts 0 0 0 0 -  PHQ-9 Score 11 3 6 12  -  Difficult doing work/chores Very difficult Somewhat difficult Somewhat difficult Very difficult -   GAD 7 : Generalized Anxiety Score 01/11/2018 09/17/2017  Nervous, Anxious, on Edge 2 3  Control/stop worrying 2 2  Worry too much -  different things 2 3  Trouble relaxing 3 3  Restless 2 2  Easily annoyed or irritable 2 3  Afraid - awful might happen 1 1  Total GAD 7 Score 14 17  Anxiety Difficulty Very difficult Somewhat difficult     Assessment and Plan  1. Anxiety Anxiety uncontrolled but patient feels improvement in her depressive symptoms.  Will increase Prozac to 40 mg a day. Suicide risks evaluated and documented in note if present or in the area below.  Patient has protective factors of family and community support.  Patient reports that family believes is behaving rationally. Patient displays problem solving skills.   Patient specifically denies suicide ideation. Patient has access/information to healthcare contacts if situation or mood changes where patient is a risk to self or others or mood becomes unstable.   During the encounter, the patient had good eye contact and firm handshake regarding safety contract and agreement to seek help if mood worsens and not to harm self.   Patient understands the treatment plan and is in agreement. Agrees to keep follow up and call prior or return to clinic if needed.   - FLUoxetine (PROZAC) 40 MG capsule; Take 1 capsule (40 mg total) by mouth daily.  Dispense: 90 capsule; Refill: 3 Patient is going to schedule appointment with Dr. Duard Brady and psychiatry in [place].  She reports she has had difficulty with behavioral health and getting an appointment here in [place], [place].  She was counseled that if she develops any abrupt changes in her mood, manic behaviors, worrisome symptomatology, or any side effects with her medication to  please call our office.  She does have numbers to call in case she had an emergency or had any suicidal ideations.  She was told to call with any questions or concerns.  She was encouraged to initiate dietary changes and to continue/restart exercise.  We discussed how exercise, lifestyle modifications can improve stress and mood. 2. Depression, recurrent (HCC)  - FLUoxetine (PROZAC) 40 MG capsule; Take 1 capsule (40 mg total) by mouth daily.  Dispense: 90 capsule; Refill: 3  3. Need for immunization against influenza  - Flu Vaccine QUAD 36+ mos IM  No follow-ups on file.  Patient will establish care with new PCP office and follow-up in 4 weeks or sooner if needed.  She will check back in with our office in the next 1 to 2 weeks and let us know how she is doing with her mood.  She also agrees to establish care with psychiatry. Aliene Beams, MD 01/11/2018

## 2018-01-31 ENCOUNTER — Encounter: Payer: Self-pay | Admitting: Family Medicine

## 2018-12-26 DIAGNOSIS — W19XXXA Unspecified fall, initial encounter: Secondary | ICD-10-CM | POA: Diagnosis not present

## 2018-12-26 DIAGNOSIS — R42 Dizziness and giddiness: Secondary | ICD-10-CM | POA: Diagnosis not present

## 2018-12-26 DIAGNOSIS — R4020 Unspecified coma: Secondary | ICD-10-CM | POA: Diagnosis not present

## 2019-01-31 ENCOUNTER — Encounter: Payer: Self-pay | Admitting: Family Medicine

## 2019-01-31 ENCOUNTER — Ambulatory Visit (INDEPENDENT_AMBULATORY_CARE_PROVIDER_SITE_OTHER): Payer: BC Managed Care – PPO | Admitting: Family Medicine

## 2019-01-31 ENCOUNTER — Other Ambulatory Visit: Payer: Self-pay

## 2019-01-31 VITALS — BP 114/58 | HR 87 | Temp 97.8°F | Resp 14 | Ht 60.0 in | Wt 199.1 lb

## 2019-01-31 DIAGNOSIS — E669 Obesity, unspecified: Secondary | ICD-10-CM

## 2019-01-31 DIAGNOSIS — F419 Anxiety disorder, unspecified: Secondary | ICD-10-CM

## 2019-01-31 DIAGNOSIS — F339 Major depressive disorder, recurrent, unspecified: Secondary | ICD-10-CM | POA: Diagnosis not present

## 2019-01-31 DIAGNOSIS — R5383 Other fatigue: Secondary | ICD-10-CM

## 2019-01-31 DIAGNOSIS — Z1239 Encounter for other screening for malignant neoplasm of breast: Secondary | ICD-10-CM | POA: Diagnosis not present

## 2019-01-31 DIAGNOSIS — Z124 Encounter for screening for malignant neoplasm of cervix: Secondary | ICD-10-CM

## 2019-01-31 DIAGNOSIS — E559 Vitamin D deficiency, unspecified: Secondary | ICD-10-CM

## 2019-01-31 DIAGNOSIS — Z6835 Body mass index (BMI) 35.0-35.9, adult: Secondary | ICD-10-CM

## 2019-01-31 MED ORDER — FLUOXETINE HCL 40 MG PO CAPS: 40.0000 mg | ORAL_CAPSULE | Freq: Every day | ORAL | 3 refills | Status: DC
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
## 2019-01-31 NOTE — Patient Instructions (Addendum)
    Thank you for coming into the office today. I appreciate the opportunity to provide you with the care for your health and wellness. Today we discussed: established care  Follow up: Feb 2021 annual   Labs placed today for your to get 1 week before next appt  Goals: 1- walk after work, and daily on daily on non-work days 2- drink a glass of water before each meal and every 2-3 hours. 3- Include veggies or supplements, don't for fiber if using supplements   Refills placed today  Please continue to practice social distancing to keep you, your family, and our community safe.  If you must go out, please wear a Mask and practice good handwashing.  [place] YOUR HANDS WELL AND FREQUENTLY. AVOID TOUCHING YOUR FACE, UNLESS YOUR HANDS ARE FRESHLY WASHED.  GET FRESH AIR DAILY. STAY HYDRATED WITH WATER.   It was a pleasure to see you and I look forward to continuing to work together on your health and well-being. Please do not hesitate to call the office if you need care or have questions about your care.  Have a wonderful day and week. With Gratitude, Cherly Beach, DNP, AGNP-BC

## 2019-02-19 ENCOUNTER — Encounter: Payer: Self-pay | Admitting: Family Medicine

## 2019-02-21 ENCOUNTER — Other Ambulatory Visit: Payer: Self-pay | Admitting: Family Medicine

## 2019-02-21 DIAGNOSIS — F339 Major depressive disorder, recurrent, unspecified: Secondary | ICD-10-CM

## 2019-02-21 DIAGNOSIS — F419 Anxiety disorder, unspecified: Secondary | ICD-10-CM

## 2019-02-21 MED ORDER — FLUOXETINE HCL 40 MG PO CAPS: 40.0000 mg | ORAL_CAPSULE | Freq: Every day | ORAL | 3 refills | Status: DC

## 2019-02-23 ENCOUNTER — Ambulatory Visit: Payer: BC Managed Care – PPO | Admitting: Family Medicine

## 2019-02-25 DIAGNOSIS — Z03818 Encounter for observation for suspected exposure to other biological agents ruled out: Secondary | ICD-10-CM | POA: Diagnosis not present

## 2019-03-01 ENCOUNTER — Other Ambulatory Visit: Payer: Self-pay

## 2019-03-01 ENCOUNTER — Encounter: Payer: Self-pay | Admitting: Family Medicine

## 2019-03-01 ENCOUNTER — Ambulatory Visit (INDEPENDENT_AMBULATORY_CARE_PROVIDER_SITE_OTHER): Payer: BC Managed Care – PPO | Admitting: Family Medicine

## 2019-03-01 VITALS — BP 126/68 | HR 102 | Temp 97.6°F | Resp 15 | Ht 60.0 in | Wt 200.1 lb

## 2019-03-01 DIAGNOSIS — M62838 Other muscle spasm: Secondary | ICD-10-CM | POA: Diagnosis not present

## 2019-03-01 DIAGNOSIS — F339 Major depressive disorder, recurrent, unspecified: Secondary | ICD-10-CM

## 2019-03-01 DIAGNOSIS — F419 Anxiety disorder, unspecified: Secondary | ICD-10-CM

## 2019-03-01 MED ORDER — CYCLOBENZAPRINE HCL 5 MG PO TABS: 5.0000 mg | ORAL_TABLET | Freq: Two times a day (BID) | ORAL | 1 refills | Status: DC | PRN
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
## 2019-03-01 NOTE — Patient Instructions (Signed)
  I appreciate the opportunity to provide you with the care for your health and wellness. Today we discussed:  Depression and anxiety  Follow up: 1 weeks if back still hurts  No labs   Referral for therapy.  Continue medication at this time.  Work on walking and self care/love.  Please continue to practice social distancing to keep you, your family, and our community safe.  If you must go out, please wear a mask and practice good handwashing.  It was a pleasure to see you and I look forward to continuing to work together on your health and well-being. Please do not hesitate to call the office if you need care or have questions about your care.  Have a wonderful day and week. With Gratitude, Cherly Beach, DNP, AGNP-BC

## 2019-03-06 ENCOUNTER — Telehealth: Payer: Self-pay

## 2019-03-06 NOTE — Telephone Encounter (Signed)
[facility] - left message.  

## 2019-03-09 ENCOUNTER — Encounter: Payer: Self-pay | Admitting: Advanced Practice Midwife

## 2019-03-09 ENCOUNTER — Other Ambulatory Visit (HOSPITAL_COMMUNITY)
Admission: RE | Admit: 2019-03-09 | Discharge: 2019-03-09 | Disposition: A | Discharge status: Home / Self Care | Payer: BC Managed Care – PPO | Source: Ambulatory Visit | Attending: Advanced Practice Midwife | Admitting: Advanced Practice Midwife

## 2019-03-09 ENCOUNTER — Ambulatory Visit (INDEPENDENT_AMBULATORY_CARE_PROVIDER_SITE_OTHER): Payer: BC Managed Care – PPO | Admitting: Advanced Practice Midwife

## 2019-03-09 ENCOUNTER — Other Ambulatory Visit: Payer: Self-pay

## 2019-03-09 VITALS — BP 107/73 | HR 80 | Ht 60.0 in | Wt 198.0 lb

## 2019-03-09 DIAGNOSIS — Z01419 Encounter for gynecological examination (general) (routine) without abnormal findings: Secondary | ICD-10-CM | POA: Diagnosis not present

## 2019-03-09 NOTE — Patient Instructions (Signed)
Call [phone] to schedule mammogram °

## 2019-03-09 NOTE — Progress Notes (Signed)
[facility] Visit  Patient name: Patricia Becker MRN [id number]  Date of birth: July 04, 1975  CC & HPI:  Patricia Becker is a 43 y.o. Caucasian female presenting today for pap smear, referred by PCP. Last pap 3-5 years ago, normal.     Pertinent History Reviewed:  Medical & Surgical Hx:   Past Medical History:  Diagnosis Date  . Anxiety   . Anxiety   . Depression   . Depression    Past Surgical History:  Procedure Laterality Date  . CESAREAN SECTION    . COLONOSCOPY     2017, normal.   . KNEE SURGERY  1981  . TUBAL LIGATION     Family History  Problem Relation Age of Onset  . Colon polyps Mother   . Suicidality Father   . Hypertension Sister   . Colon cancer Maternal Grandmother   . Cancer Maternal Grandmother   . Drug abuse Other     Current Outpatient Medications:  .  b complex vitamins tablet, Take 1 tablet by mouth daily., Disp: , Rfl:  .  cyclobenzaprine (FLEXERIL) 5 MG tablet, Take 1 tablet (5 mg total) by mouth 2 (two) times daily as needed for muscle spasms., Disp: 30 tablet, Rfl: 1 .  FLUoxetine (PROZAC) 40 MG capsule, Take 1 capsule (40 mg total) by mouth daily., Disp: 90 capsule, Rfl: 3 .  Melatonin 10 MG TABS, Take 10 mg by mouth at bedtime as needed (sleep)., Disp: , Rfl:  .  Multiple Vitamin (MULTIVITAMIN) tablet, Take 1 tablet by mouth daily., Disp: , Rfl:  Social History: Reviewed -  reports that she has never smoked. She has never used smokeless tobacco.  Review of Systems:   Constitutional: Negative for fever and chills Eyes: Negative for visual disturbances Respiratory: Negative for shortness of breath, dyspnea Cardiovascular: Negative for chest pain or palpitations  Gastrointestinal: Negative for vomiting, diarrhea and constipation; no abdominal pain Genitourinary: Negative for dysuria and urgency, vaginal irritation or itching Musculoskeletal: Negative for back pain, joint pain, myalgias  Neurological: Negative for dizziness and  headaches    Objective Findings:    Physical Examination: Vitals:   03/09/19 1350  BP: 107/73  Pulse: 80   General appearance - well appearing, and in no distress Mental status - alert, oriented to person, place, and time Chest:  Normal respiratory effort Heart - normal rate and regular rhythm Breasts:  Soft, symmetrical w/o dominant masses/retractions Abdomen:  Soft, nontender Pelvic: SSE:  Normal appearing vagina. Cx bulbous, smooth.  Uterus NSSC, no adnexal masses/tenderness.  Exam limited by habitus.  Musculoskeletal:  Normal range of motion without pain Extremities:  No edema    No results found for this or any previous visit (from the past 24 hour(s)).    Assessment & Plan:  A:   Normal GYN exam P:  Dependent on results  Mammogram encouraged (already ordered by PCP in Epic)   Return for If you have any problems.  Joaquim Lai Cresenzo-Dishmon CNM 03/09/2019 2:02 PM

## 2019-03-15 LAB — CYTOLOGY - PAP
Chlamydia: NEGATIVE
Comment: NEGATIVE
Comment: NEGATIVE
Comment: NORMAL
Diagnosis: NEGATIVE
High risk HPV: NEGATIVE
Neisseria Gonorrhea: NEGATIVE

## 2019-03-23 ENCOUNTER — Other Ambulatory Visit: Payer: Self-pay

## 2019-03-23 ENCOUNTER — Ambulatory Visit
Admission: EM | Admit: 2019-03-23 | Discharge: 2019-03-23 | Disposition: A | Discharge status: Home / Self Care | Payer: BC Managed Care – PPO | Attending: Emergency Medicine | Admitting: Emergency Medicine

## 2019-03-23 DIAGNOSIS — G43009 Migraine without aura, not intractable, without status migrainosus: Secondary | ICD-10-CM | POA: Diagnosis not present

## 2019-03-23 MED ORDER — KETOROLAC TROMETHAMINE 60 MG/2ML IM SOLN
60.0000 mg | Freq: Once | INTRAMUSCULAR | Status: AC
  Administered 2019-03-23: 60 mg via INTRAMUSCULAR

## 2019-03-23 MED ORDER — DEXAMETHASONE SODIUM PHOSPHATE 10 MG/ML IJ SOLN
10.0000 mg | Freq: Once | INTRAMUSCULAR | Status: AC
  Administered 2019-03-23: 10 mg via INTRAMUSCULAR

## 2019-03-23 MED ORDER — ONDANSETRON 4 MG PO TBDP
4.0000 mg | ORAL_TABLET | Freq: Once | ORAL | Status: AC
  Administered 2019-03-23: 4 mg via ORAL

## 2019-03-23 NOTE — Discharge Instructions (Signed)
Migraine cocktail given in office (toradol, decadron, and zofran) Rest and drink plenty of fluids Use OTC medications as needed for symptomatic relief Follow up with PCP if symptoms persists Return or go to the ER if you have any new or worsening symptoms such as fever, chills, nausea, vomiting, chest pain, shortness of breath, cough, vision changes, worsening headache despite treatment, slurred speech, facial asymmetry, weakness in arms or legs, etc..Marland Kitchen

## 2019-03-23 NOTE — ED Triage Notes (Signed)
Pt presents with c/o headache for past 2 days

## 2019-03-23 NOTE — ED Provider Notes (Signed)
[facility]   [id number] 03/23/19 Arrival Time: 1922  CC: HEADACHE  SUBJECTIVE:  Patricia Becker is a 43 y.o. female who complains of migraine x 2 days.  Denies a precipitating event, or recent head trauma.  Pain diffuse about the circumference of the head.  Describes the pain as intermittent and nagging in character.  7/10.  Patient has tried OTC aleve without relief. Symptoms are made worse with light.  Reports similar symptoms in the past that improved with aleve and tylenol.  This is not the worst headache of their life.  Complains of associated photophobia and nausea.  Patient denies fever, chills, vomiting, aura, rhinorrhea, watery eyes, chest pain, SOB, abdominal pain, weakness, numbness or tingling, slurred speech.    ROS: As per HPI.  All other pertinent ROS negative.     Past Medical History:  Diagnosis Date  . Anxiety   . Anxiety   . Depression   . Depression    Past Surgical History:  Procedure Laterality Date  . CESAREAN SECTION    . COLONOSCOPY     2017, normal.   . KNEE SURGERY  1981  . TUBAL LIGATION     Allergies  Allergen Reactions  . Demerol [Meperidine Hcl] Anaphylaxis  . Aspirin Other (See Comments)    Upset stomach   No current facility-administered medications on file prior to encounter.    Current Outpatient Medications on File Prior to Encounter  Medication Sig Dispense Refill  . b complex vitamins tablet Take 1 tablet by mouth daily.    . cyclobenzaprine (FLEXERIL) 5 MG tablet Take 1 tablet (5 mg total) by mouth 2 (two) times daily as needed for muscle spasms. 30 tablet 1  . FLUoxetine (PROZAC) 40 MG capsule Take 1 capsule (40 mg total) by mouth daily. 90 capsule 3  . Melatonin 10 MG TABS Take 10 mg by mouth at bedtime as needed (sleep).    . Multiple Vitamin (MULTIVITAMIN) tablet Take 1 tablet by mouth daily.     Social History   Socioeconomic History  . Marital status: Married    Spouse name: Dwayne  . Number of children: 3  .  Years of education: Not on file  . Highest education level: Bachelor's degree (e.g., BA, AB, BS)  Occupational History  . Not on file  Social Needs  . Financial resource strain: Not hard at all  . Food insecurity    Worry: Never true    Inability: Never true  . Transportation needs    Medical: No    Non-medical: No  Tobacco Use  . Smoking status: Never Smoker  . Smokeless tobacco: Never Used  Substance and Sexual Activity  . Alcohol use: Yes    Alcohol/week: 2.0 standard drinks    Types: 2 Glasses of wine per week    Comment: occassionally   . Drug use: No  . Sexual activity: Yes    Birth control/protection: Surgical  Lifestyle  . Physical activity    Days per week: 0 days    Minutes per session: 0 min  . Stress: Only a little  Relationships  . Social connections    Talks on phone: More than three times a week    Gets together: Three times a week    Attends religious service: Never    Active member of club or organization: No    Attends meetings of clubs or organizations: Never    Relationship status: Married  . Intimate partner violence    Fear of  current or ex partner: No    Emotionally abused: No    Physically abused: No    Forced sexual activity: No  Other Topics Concern  . Not on file  Social History Narrative   Moved from [place], [place] in 01/2017. Had lived there for over 20 years. Moved with husband, son, and daughter.       Daughter is 44 y/o-married, expecting and lives in [place]   Daughter is 30 y/o married, has 2 children: 3 and 1 y/o living with her right now   Son is 71 y/o.      Daugther is 2 y/o- husband daughter-step daughter. Lives with her mother now.      Enjoys walking trials, and working out when she could, netflix and chill, and family      Diet: reports she use to meal prep, but has stopped, is supplementing with green powders, meats, fruits   Caffeine: 20 oz 3-4 time a week, no soda, tea- at times mixed sweet and unsweet   Water: 5-8  cups daily      Wears seatbelt.    Wears sunscreen.   Smoke detectors at home.   Does not use phone while driving.   Family History  Problem Relation Age of Onset  . Colon polyps Mother   . Suicidality Father   . Hypertension Sister   . Colon cancer Maternal Grandmother   . Cancer Maternal Grandmother   . Drug abuse Other     OBJECTIVE:  Vitals:   03/23/19 1932  BP: 115/77  Pulse: 66  Resp: 18  Temp: 97.9 F (36.6 C)  SpO2: 97%    General appearance: alert; mildly fatigued, but no distress Eyes: PERRLA; EOMI HENT: normocephalic; atraumatic; nares patent without rhinorrhea; oropharynx clear Neck: supple with FROM Lungs: clear to auscultation bilaterally Heart: regular rate and rhythm.  Radial pulses 2+ symmetrical bilaterally Extremities: no edema; symmetrical with no gross deformities Skin: warm and dry Neurologic: CN 2-12 grossly intact; finger to nose without difficulty; normal gait; strength and sensation intact bilaterally about the upper and lower extremities; negative pronator drift Psychological: alert and cooperative; normal mood and affect   ASSESSMENT & PLAN:  1. Migraine without aura and without status migrainosus, not intractable     Meds ordered this encounter  Medications  . ketorolac (TORADOL) injection 60 mg  . dexamethasone (DECADRON) injection 10 mg  . ondansetron (ZOFRAN-ODT) disintegrating tablet 4 mg    Migraine cocktail given in office (toradol, decadron, and zofran) Rest and drink plenty of fluids Use OTC medications as needed for symptomatic relief Follow up with PCP if symptoms persists Return or go to the ER if you have any new or worsening symptoms such as fever, chills, nausea, vomiting, chest pain, shortness of breath, cough, vision changes, worsening headache despite treatment, slurred speech, facial asymmetry, weakness in arms or legs, etc...  Reviewed expectations re: course of current medical issues. Questions answered.  Outlined signs and symptoms indicating need for more acute intervention. Patient verbalized understanding. After Visit Summary given.   Rennis Harding, PA-C 03/23/19 1949

## 2019-04-13 ENCOUNTER — Other Ambulatory Visit: Payer: Self-pay

## 2019-04-13 ENCOUNTER — Encounter (HOSPITAL_COMMUNITY): Payer: Self-pay | Admitting: *Deleted

## 2019-04-13 ENCOUNTER — Emergency Department (HOSPITAL_COMMUNITY)
Admission: EM | Admit: 2019-04-13 | Discharge: 2019-04-14 | Disposition: A | Discharge status: Home / Self Care | Payer: BC Managed Care – PPO | Attending: Emergency Medicine | Admitting: Emergency Medicine

## 2019-04-13 ENCOUNTER — Emergency Department (HOSPITAL_COMMUNITY): Payer: BC Managed Care – PPO

## 2019-04-13 DIAGNOSIS — S299XXA Unspecified injury of thorax, initial encounter: Secondary | ICD-10-CM | POA: Diagnosis not present

## 2019-04-13 DIAGNOSIS — Y9389 Activity, other specified: Secondary | ICD-10-CM | POA: Diagnosis not present

## 2019-04-13 DIAGNOSIS — Y998 Other external cause status: Secondary | ICD-10-CM | POA: Insufficient documentation

## 2019-04-13 DIAGNOSIS — F419 Anxiety disorder, unspecified: Secondary | ICD-10-CM | POA: Diagnosis not present

## 2019-04-13 DIAGNOSIS — F32A Depression, unspecified: Secondary | ICD-10-CM

## 2019-04-13 DIAGNOSIS — Y9289 Other specified places as the place of occurrence of the external cause: Secondary | ICD-10-CM | POA: Insufficient documentation

## 2019-04-13 DIAGNOSIS — F329 Major depressive disorder, single episode, unspecified: Secondary | ICD-10-CM | POA: Insufficient documentation

## 2019-04-13 DIAGNOSIS — Z79899 Other long term (current) drug therapy: Secondary | ICD-10-CM | POA: Diagnosis not present

## 2019-04-13 DIAGNOSIS — Z046 Encounter for general psychiatric examination, requested by authority: Secondary | ICD-10-CM | POA: Diagnosis not present

## 2019-04-13 LAB — CBC WITH DIFFERENTIAL/PLATELET
Abs Immature Granulocytes: 0.03 10*3/uL (ref 0.00–0.07)
Basophils Absolute: 0.1 10*3/uL (ref 0.0–0.1)
Basophils Relative: 1 %
Eosinophils Absolute: 0.2 10*3/uL (ref 0.0–0.5)
Eosinophils Relative: 2 %
HCT: 39.7 % (ref 36.0–46.0)
Hemoglobin: 12.5 g/dL (ref 12.0–15.0)
Immature Granulocytes: 0 %
Lymphocytes Relative: 25 %
Lymphs Abs: 2.1 10*3/uL (ref 0.7–4.0)
MCH: 29.3 pg (ref 26.0–34.0)
MCHC: 31.5 g/dL (ref 30.0–36.0)
MCV: 93 fL (ref 80.0–100.0)
Monocytes Absolute: 0.8 10*3/uL (ref 0.1–1.0)
Monocytes Relative: 10 %
Neutro Abs: 5.5 10*3/uL (ref 1.7–7.7)
Neutrophils Relative %: 62 %
Platelets: 234 10*3/uL (ref 150–400)
RBC: 4.27 MIL/uL (ref 3.87–5.11)
RDW: 13.7 % (ref 11.5–15.5)
WBC: 8.7 10*3/uL (ref 4.0–10.5)
nRBC: 0 % (ref 0.0–0.2)

## 2019-04-13 LAB — RAPID URINE DRUG SCREEN, HOSP PERFORMED
Amphetamines: NOT DETECTED
Barbiturates: NOT DETECTED
Benzodiazepines: NOT DETECTED
Cocaine: NOT DETECTED
Opiates: NOT DETECTED
Tetrahydrocannabinol: NOT DETECTED

## 2019-04-13 LAB — POC URINE PREG, ED: Preg Test, Ur: NEGATIVE

## 2019-04-13 NOTE — ED Notes (Signed)
Security wanded pt in triage.  Pt changed into paper scrubs.  Pt's cell phone and clothes/shoes locked in the locker within the dept. Per pt request, pt's pocketbook given to husband.
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
## 2019-04-14 LAB — COMPREHENSIVE METABOLIC PANEL
ALT: 22 U/L (ref 0–44)
AST: 13 U/L — ABNORMAL LOW (ref 15–41)
Albumin: 3.6 g/dL (ref 3.5–5.0)
Alkaline Phosphatase: 69 U/L (ref 38–126)
Anion gap: 8 (ref 5–15)
BUN: 16 mg/dL (ref 6–20)
CO2: 23 mmol/L (ref 22–32)
Calcium: 8.4 mg/dL — ABNORMAL LOW (ref 8.9–10.3)
Chloride: 109 mmol/L (ref 98–111)
Creatinine, Ser: 0.79 mg/dL (ref 0.44–1.00)
GFR calc Af Amer: >60 mL/min (ref >60)
GFR calc non Af Amer: >60 mL/min (ref >60)
Glucose, Bld: 86 mg/dL (ref 70–99)
Potassium: 3.9 mmol/L (ref 3.5–5.1)
Sodium: 140 mmol/L (ref 135–145)
Total Bilirubin: 0.4 mg/dL (ref 0.3–1.2)
Total Protein: 6.3 g/dL — ABNORMAL LOW (ref 6.5–8.1)

## 2019-04-14 LAB — ETHANOL: Alcohol, Ethyl (B): <10 mg/dL (ref <10)

## 2019-04-14 MED ORDER — FLUOXETINE HCL 20 MG PO CAPS
40.0000 mg | ORAL_CAPSULE | Freq: Every day | ORAL | Status: DC
  Administered 2019-04-14: 40 mg via ORAL
  Filled 2019-04-14: qty 2

## 2019-04-14 MED ORDER — MELATONIN 3 MG PO TABS: 10.0000 mg | ORAL_TABLET | Freq: Every evening | ORAL | Status: DC | PRN

## 2019-04-14 NOTE — ED Notes (Signed)
Lunch tray given. 

## 2019-04-14 NOTE — Progress Notes (Signed)
Talbot Grumbling, NP recommends pt be observed and monitored for safety and reassessed in the AM by psych. EDP Recardo Evangelist, PA-C and pt's nurse have been advised.  Lind Covert, MSW, LCSW Therapeutic Triage Specialist  [phone]

## 2019-04-14 NOTE — Discharge Instructions (Addendum)
Recommend follow-up with your primary doctor and [facility] to discuss the symptoms you are experiencing today.  Please return to ER if you have any further thoughts of hurting herself, hurting others or have auditory or visual hallucinations.

## 2019-04-14 NOTE — ED Notes (Signed)
Pt in purple scrubs. Belongings in locker. Denies si/hi

## 2019-04-14 NOTE — ED Notes (Signed)
Discharge instructions reviewed with patient. Patient given list of Outpatient treatment facilities. Pt verbalized understanding.

## 2019-04-14 NOTE — Consult Note (Signed)
  Patient seen and case discussed with treatment team. At this time she denies any suicidal ideation, homicidal ideations, self harm. 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 She states that her work schedule(nights) prohibit her from attending  IOP/PHP groups. She is inquiring and shows interest in [facility] for medication management and therapy. She reports a history of suicide completion by her biological father. She reports being on prozac that was started by her PCP, currently taking 40mg  and . She has 4 children (23,22,20,1800. She does endorse being hopeless but not suicidal. Will psych clear at this time. Please discharge with IOP/PHP resources.

## 2019-04-14 NOTE — ED Notes (Signed)
TTS in process 
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
## 2019-04-18 ENCOUNTER — Other Ambulatory Visit: Payer: Self-pay

## 2019-04-18 ENCOUNTER — Encounter: Payer: Self-pay | Admitting: Family Medicine

## 2019-04-18 ENCOUNTER — Telehealth (INDEPENDENT_AMBULATORY_CARE_PROVIDER_SITE_OTHER): Payer: BC Managed Care – PPO | Admitting: Family Medicine

## 2019-04-18 DIAGNOSIS — F339 Major depressive disorder, recurrent, unspecified: Secondary | ICD-10-CM

## 2019-04-18 DIAGNOSIS — Z8639 Personal history of other endocrine, nutritional and metabolic disease: Secondary | ICD-10-CM | POA: Diagnosis not present

## 2019-04-18 DIAGNOSIS — F419 Anxiety disorder, unspecified: Secondary | ICD-10-CM | POA: Diagnosis not present

## 2019-04-18 DIAGNOSIS — Z09 Encounter for follow-up examination after completed treatment for conditions other than malignant neoplasm: Secondary | ICD-10-CM | POA: Diagnosis not present

## 2019-04-18 DIAGNOSIS — Z79899 Other long term (current) drug therapy: Secondary | ICD-10-CM | POA: Diagnosis not present

## 2019-04-18 MED ORDER — BUPROPION HCL ER (XL) 150 MG PO TB24: 150.0000 mg | ORAL_TABLET | Freq: Every day | ORAL | 0 refills | Status: DC
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
## 2019-04-19 ENCOUNTER — Other Ambulatory Visit: Payer: Self-pay | Admitting: Family Medicine

## 2019-04-19 LAB — TSH: TSH: 1.97 mIU/L

## 2019-04-19 LAB — VITAMIN D 25 HYDROXY (VIT D DEFICIENCY, FRACTURES): Vit D, 25-Hydroxy: 20 ng/mL — ABNORMAL LOW (ref 30–100)

## 2019-04-19 MED ORDER — VITAMIN D (ERGOCALCIFEROL) 1.25 MG (50000 UNIT) PO CAPS: 50000.0000 [IU] | ORAL_CAPSULE | ORAL | 1 refills | Status: DC

## 2019-04-19 NOTE — Progress Notes (Signed)
Please let Patricia Becker know that her labs are back. She is low with Vitamin D, I will seen in her a prescription for this if willing. Her Thyroid is normal range. That is good news.

## 2019-04-22 ENCOUNTER — Encounter: Payer: Self-pay | Admitting: Family Medicine

## 2019-04-22 NOTE — Patient Instructions (Signed)
  I appreciate the opportunity to provide you with care for your health and wellness. Today we discussed: Recent emergency room visit secondary to depression.  Follow up: 3 weeks.  Please take all medications as we have discussed. If you feel like you are having issues or conflicts with the medication please reach out before stopping the medications.  Make sure you are spending time daily focusing on yourself. Being in your emotional state. Allow yourself to feel your emotions and possibly even journaling or writing them down to help you sort through the emotions that you are experiencing.  I hope you have a wonderful, happy, safe, and healthy Holiday Season!  Please continue to practice social distancing to keep you, your family, and our community safe.  If you must go out, please wear a mask and practice good handwashing.  It was a pleasure to see you and I look forward to continuing to work together on your health and well-being. Please do not hesitate to call the office if you need care or have questions about your care.  Have a wonderful day and week. With Gratitude, Cherly Beach, DNP, AGNP-BC

## 2019-04-26 DIAGNOSIS — F329 Major depressive disorder, single episode, unspecified: Secondary | ICD-10-CM | POA: Diagnosis not present

## 2019-05-02 ENCOUNTER — Encounter: Payer: Self-pay | Admitting: Family Medicine

## 2019-05-02 ENCOUNTER — Other Ambulatory Visit: Payer: Self-pay

## 2019-05-02 DIAGNOSIS — F339 Major depressive disorder, recurrent, unspecified: Secondary | ICD-10-CM

## 2019-05-02 NOTE — Telephone Encounter (Signed)
Please place referral for Patricia Becker. Thank you so much

## 2019-05-09 DIAGNOSIS — F4323 Adjustment disorder with mixed anxiety and depressed mood: Secondary | ICD-10-CM | POA: Diagnosis not present

## 2019-05-16 ENCOUNTER — Ambulatory Visit: Payer: BC Managed Care – PPO | Admitting: Family Medicine

## 2019-05-19 ENCOUNTER — Ambulatory Visit: Payer: BC Managed Care – PPO | Admitting: Family Medicine

## 2019-05-19 DIAGNOSIS — F4323 Adjustment disorder with mixed anxiety and depressed mood: Secondary | ICD-10-CM | POA: Diagnosis not present

## 2019-05-22 ENCOUNTER — Encounter: Payer: Self-pay | Admitting: Family Medicine

## 2019-05-24 ENCOUNTER — Ambulatory Visit: Payer: BC Managed Care – PPO | Admitting: Family Medicine

## 2019-05-27 DIAGNOSIS — F4323 Adjustment disorder with mixed anxiety and depressed mood: Secondary | ICD-10-CM | POA: Diagnosis not present

## 2019-05-30 ENCOUNTER — Ambulatory Visit: Payer: BC Managed Care – PPO | Admitting: Psychology

## 2019-05-30 DIAGNOSIS — R0789 Other chest pain: Secondary | ICD-10-CM | POA: Diagnosis not present

## 2019-05-30 DIAGNOSIS — R079 Chest pain, unspecified: Secondary | ICD-10-CM | POA: Diagnosis not present

## 2019-05-31 ENCOUNTER — Other Ambulatory Visit: Payer: Self-pay

## 2019-05-31 ENCOUNTER — Encounter: Payer: Self-pay | Admitting: Family Medicine

## 2019-05-31 ENCOUNTER — Telehealth (INDEPENDENT_AMBULATORY_CARE_PROVIDER_SITE_OTHER): Payer: BC Managed Care – PPO | Admitting: Family Medicine

## 2019-05-31 VITALS — Ht 60.0 in | Wt 195.0 lb

## 2019-05-31 DIAGNOSIS — E669 Obesity, unspecified: Secondary | ICD-10-CM

## 2019-05-31 DIAGNOSIS — Z6835 Body mass index (BMI) 35.0-35.9, adult: Secondary | ICD-10-CM | POA: Diagnosis not present

## 2019-05-31 DIAGNOSIS — F339 Major depressive disorder, recurrent, unspecified: Secondary | ICD-10-CM | POA: Diagnosis not present

## 2019-05-31 DIAGNOSIS — F419 Anxiety disorder, unspecified: Secondary | ICD-10-CM

## 2019-05-31 MED ORDER — BUSPIRONE HCL 5 MG PO TABS: 5.0000 mg | ORAL_TABLET | Freq: Two times a day (BID) | ORAL | 1 refills | Status: DC

## 2019-05-31 NOTE — Assessment & Plan Note (Signed)
PHQ 14, not much improved, she desires to get better. I have advised to continue all medications prescribed. Work on setting goals for the year. Normalizing feelings and emotions, and acknowledging them, but not letting them over run her. She is to continue journaling. I have asked her to work on self care also, by going outside 5-10 daily for the next week. Close follow up, as she is still looking for a therapist.

## 2019-05-31 NOTE — Progress Notes (Signed)
Virtual Visit via Telephone Note   This visit type was conducted due to national recommendations for restrictions regarding the COVID-19 Pandemic (e.g. social distancing) in an effort to limit this patient's exposure and mitigate transmission in our community.  Due to her co-morbid illnesses, this patient is at least at moderate risk for complications without adequate follow up.  This format is felt to be most appropriate for this patient at this time.  The patient did not have access to video technology/had technical difficulties with video requiring transitioning to audio format only (telephone).  All issues noted in this document were discussed and addressed.  No physical exam could be performed with this format.    Evaluation Performed:  Follow-up visit  Date:  05/31/2019   ID:  Patricia Becker, DOB 1975-06-02, MRN [id number]  Patient Location: Home Provider Location: Office  Location of Patient: Home Location of Provider: Telehealth Consent was obtain for visit to be over via telehealth. I verified that I am speaking with the correct person using two identifiers.  PCP:  Perlie Mayo, NP   Chief Complaint:  Depression, anxiety   History of Present Illness:    Patricia Becker is a 44 y.o. female with history of anxiety and depression. Over the last several weeks to months she has been suffer with uncontrolled depression, possibly bipolar disorder. Please see previous notes for detailed history.   She currently goes to [facility] to help with medication management. They have add Abilify to her medication reg and changed the wellbutrin to SR version. She has just been on these a few weeks. She reports still having depression, but that her anxiety is getting the best of her and she even went to the ER yesterday because she didn't know if something was going on with her heart. She reports they told her it was only anxiety. Today she would like help with this.  She has taken buspar in the  past and it was helpful. She started taking some of there husband's atarax-just to try. Overall she is tearful and really wants to feel better. She is also not doing much self care and knows she needs to work on that.  She is journaling some that is helpful, but she reports poor body image concerns and knows she needs to start being active. She reports work is going okay and that it offers a little reprive from the at home stress.     The patient does not have symptoms concerning for COVID-19 infection (fever, chills, cough, or new shortness of breath).   Past Medical, Surgical, Social History, Allergies, and Medications have been Reviewed.  Past Medical History:  Diagnosis Date  . Anxiety   . Anxiety   . Depression   . Depression    Past Surgical History:  Procedure Laterality Date  . CESAREAN SECTION    . COLONOSCOPY     2017, normal.   . KNEE SURGERY  1981  . TUBAL LIGATION       Current Meds  Medication Sig  . ARIPiprazole (ABILIFY) 2 MG tablet Take 2 mg by mouth daily.  . Ascorbic Acid (VITAMIN C GUMMIE PO) Take 1 tablet by mouth daily.  Marland Kitchen buPROPion (WELLBUTRIN SR) 150 MG 12 hr tablet Take 150 mg by mouth daily.  . Melatonin 10 MG TABS Take 10 mg by mouth at bedtime as needed (sleep).  . Multiple Vitamin (MULTIVITAMIN) tablet Take 1 tablet by mouth daily.  . Vitamin D, Ergocalciferol, (DRISDOL) 1.25 MG (  50000 UT) CAPS capsule Take 1 capsule (50,000 Units total) by mouth every 7 (seven) days.  . Zinc 100 MG TABS Take 1 tablet by mouth daily.     Allergies:   Demerol [meperidine hcl] and Aspirin   ROS:   Please see the history of present illness.    All other systems reviewed and are negative.   Labs/Other Tests and Data Reviewed:    Recent Labs: 04/13/2019: ALT 22; BUN 16; Creatinine, Ser 0.79; Hemoglobin 12.5; Platelets 234; Potassium 3.9; Sodium 140 04/18/2019: TSH 1.97   Recent Lipid Panel Lab Results  Component Value Date/Time   CHOL 136 07/08/2017 10:10 AM    TRIG 79 07/08/2017 10:10 AM   HDL 52 07/08/2017 10:10 AM   CHOLHDL 2.6 07/08/2017 10:10 AM   LDLCALC 68 07/08/2017 10:10 AM    Wt Readings from Last 3 Encounters:  05/31/19 195 lb (88.5 kg)  04/13/19 195 lb (88.5 kg)  03/09/19 198 lb (89.8 kg)     Objective:    Vital Signs:  Ht 5' (1.524 m)   Wt 195 lb (88.5 kg)   BMI 38.08 kg/m    GEN:  alert and oriented  RESPIRATORY:  normal breathing-no shortness of breath  PSYCH:  normal affect and mood   Depression screen [facility] 2/9 05/31/2019 04/18/2019 03/09/2019 03/01/2019 01/31/2019  Decreased Interest 0 0 0 2 0  Down, Depressed, Hopeless 3 3 0 2 0  PHQ - 2 Score 3 3 0 4 0  Altered sleeping 0 0 0 2 -  Tired, decreased energy 0 3 0 2 -  Change in appetite 2 3 0 2 -  Feeling bad or failure about yourself  3 3 1 2  -  Trouble concentrating 3 3 0 0 -  Moving slowly or fidgety/restless 3 0 0 1 -  Suicidal thoughts 0 0 0 0 -  PHQ-9 Score 14 15 1 13  -  Difficult doing work/chores - - Not difficult at all Very difficult -  Some recent data might be hidden      ASSESSMENT & PLAN:    Please see problem list for A&P of below Dx.  1. Depression, recurrent (HCC)   2. Anxiety  - busPIRone (BUSPAR) 5 MG tablet; Take 1 tablet (5 mg total) by mouth 2 (two) times daily.  Dispense: 60 tablet; Refill: 1  3. Class 2 obesity without serious comorbidity with body mass index (BMI) of 35.0 to 35.9 in adult, unspecified obesity type   Time:   Today, I have spent 10 minutes with the patient with telehealth technology discussing the above problems.     Medication Adjustments/Labs and Tests Ordered: Current medicines are reviewed at length with the patient today.  Concerns regarding medicines are outlined above.   Tests Ordered: No orders of the defined types were placed in this encounter.   Medication Changes: No orders of the defined types were placed in this encounter.   Disposition:  Follow up 1 week by phone   Signed, , NP  05/31/2019 8:47 AM     [facility]

## 2019-05-31 NOTE — Assessment & Plan Note (Signed)
Worsening, advised to identify goals in a smart fashion to help her build a path she can follow. She is to work on self care this next week and follow up with me in 1 week to discuss. She needs to learn grounding techniques, which I will introduce at next visit.  I have asked her to make goals that are specific, measurable, attainable, realistic, and timed.  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 She sounded receptive to this idea.  I have added buspar daily, and would like to avoid sedative medications for anxiety if possible.

## 2019-05-31 NOTE — Patient Instructions (Signed)
Happy New Year! May you have a year filled with hope, love, happiness and laughter.  I appreciate the opportunity to provide you with care for your health and wellness. Today we discussed: anxiety   Follow up: 1 week by phone   No labs or referrals today  Goals need to be "SMART" remember your nursing school days. S-Specific M-Measurable A- Attainable  R- Realistic  T- Timed  This weeks SMART GOAL: To sit outside everyday for 5-10 minutes... (no matter the weather) bundle up, or use umbrella if needed.  Continue journaling. Think about developing a list of goals for this year. Identify things you would like to accomplish. Together with me or a therapist we will work to help you develop steps to meeting these goals. So take time to identify these, this is not a rush. Nor does the list have to be permanent, it can change.   Remember, emotions are good to acknowledge, but let them be like clouds on a sunny day and float by.  Please continue to practice social distancing to keep you, your family, and our community safe.  If you must go out, please wear a mask and practice good handwashing.  It was a pleasure to see you and I look forward to continuing to work together on your health and well-being. Please do not hesitate to call the office if you need care or have questions about your care.  Have a wonderful day and week. With Gratitude, Tereasa Coop, DNP, AGNP-BC

## 2019-05-31 NOTE — Assessment & Plan Note (Signed)
Patricia Becker is educated about the importance of exercise daily to help with weight management. A minumum of 30 minutes daily is recommended.   Wt Readings from Last 3 Encounters:  05/31/19 195 lb (88.5 kg)  04/13/19 195 lb (88.5 kg)  03/09/19 198 lb (89.8 kg)

## 2019-06-02 DIAGNOSIS — F4323 Adjustment disorder with mixed anxiety and depressed mood: Secondary | ICD-10-CM | POA: Diagnosis not present

## 2019-06-03 DIAGNOSIS — F4323 Adjustment disorder with mixed anxiety and depressed mood: Secondary | ICD-10-CM | POA: Diagnosis not present

## 2019-06-07 ENCOUNTER — Ambulatory Visit (INDEPENDENT_AMBULATORY_CARE_PROVIDER_SITE_OTHER): Payer: BC Managed Care – PPO | Admitting: Family Medicine

## 2019-06-07 ENCOUNTER — Encounter: Payer: Self-pay | Admitting: Family Medicine

## 2019-06-07 ENCOUNTER — Other Ambulatory Visit: Payer: Self-pay

## 2019-06-07 DIAGNOSIS — F329 Major depressive disorder, single episode, unspecified: Secondary | ICD-10-CM | POA: Diagnosis not present

## 2019-06-07 DIAGNOSIS — F419 Anxiety disorder, unspecified: Secondary | ICD-10-CM

## 2019-06-07 DIAGNOSIS — F339 Major depressive disorder, recurrent, unspecified: Secondary | ICD-10-CM | POA: Diagnosis not present

## 2019-06-07 NOTE — Patient Instructions (Addendum)
Happy New Year! May you have a year filled with hope, love, happiness and laughter.  I appreciate the opportunity to provide you with care for your health and wellness. Today we discussed: depression, grounding and tapping techniques  Follow up: as scheduled  No labs or referrals today  Continue to focus on self care. I have attached a grounding photo so you can reference it. I would like you to look up tapping for emotional freedom as well.  Please continue to practice social distancing to keep you, your family, and our community safe.  If you must go out, please wear a mask and practice good handwashing.  It was a pleasure to see you and I look forward to continuing to work together on your health and well-being. Please do not hesitate to call the office if you need care or have questions about your care.  Have a wonderful day and week. With Gratitude, Tereasa Coop, DNP, AGNP-BC

## 2019-06-07 NOTE — Assessment & Plan Note (Signed)
She reports improvement will continue buspar. We discussed grounding techniques today and I provided written info on them on her AVS. Therapy appts setup and advised continue self care.

## 2019-06-07 NOTE — Progress Notes (Signed)
Virtual Visit via Telephone Note   This visit type was conducted due to national recommendations for restrictions regarding the COVID-19 Pandemic (e.g. social distancing) in an effort to limit this patient's exposure and mitigate transmission in our community.  Due to her co-morbid illnesses, this patient is at least at moderate risk for complications without adequate follow up.  This format is felt to be most appropriate for this patient at this time.  The patient did not have access to video technology/had technical difficulties with video requiring transitioning to audio format only (telephone).  All issues noted in this document were discussed and addressed.  No physical exam could be performed with this format.    Evaluation Performed:  Follow-up visit  Date:  06/07/2019   ID:  Patricia Becker, DOB 1975/12/16, MRN [id number]  Patient Location: Home Provider Location: Office  Location of Patient: Home Location of Provider: Telehealth Consent was obtain for visit to be over via telehealth. I verified that I am speaking with the correct person using two identifiers.  PCP:  Freddy Finner, NP   Chief Complaint: Depression, anxiety   History of Present Illness:    Patricia Becker is a 44 y.o. female with history of anxiety and depression. Over the last several weeks to months she has been suffer with uncontrolled depression, possibly bipolar disorder. Please see previous notes for detailed history.   She currently goes to [facility] to help with medication management. They have add Abilify to her medication reg and changed the wellbutrin to SR version. She has just been on these a few weeks. She reports still having depression, but that her anxiety was getting the best of her at her last appt and I started her on Buspar. She reports this is helping some.  Overall she is feeling down due to her husband moving out, but she reports they are going to try to work it out.  I had asked her to  improve self care and get outside more, she reports she only got out once or so this last week. But she reports putting on make up and trying to work on self care. She has several therapy appts coming up as she reports.   The patient does not have symptoms concerning for COVID-19 infection (fever, chills, cough, or new shortness of breath).   Past Medical, Surgical, Social History, Allergies, and Medications have been Reviewed.  Past Medical History:  Diagnosis Date  . Anxiety   . Anxiety   . Depression   . Depression   . Encounter for screening for HIV 07/08/2017  . Polypharmacy 07/08/2017   Past Surgical History:  Procedure Laterality Date  . CESAREAN SECTION    . COLONOSCOPY     2017, normal.   . KNEE SURGERY  1981  . TUBAL LIGATION       Current Meds  Medication Sig  . ARIPiprazole (ABILIFY) 2 MG tablet Take 2 mg by mouth daily.  . Ascorbic Acid (VITAMIN C GUMMIE PO) Take 1 tablet by mouth daily.  Marland Kitchen buPROPion (WELLBUTRIN SR) 150 MG 12 hr tablet Take 150 mg by mouth daily.  . busPIRone (BUSPAR) 5 MG tablet Take 1 tablet (5 mg total) by mouth 2 (two) times daily.  . Melatonin 10 MG TABS Take 10 mg by mouth at bedtime as needed (sleep).  . Multiple Vitamin (MULTIVITAMIN) tablet Take 1 tablet by mouth daily.  . Vitamin D, Ergocalciferol, (DRISDOL) 1.25 MG (50000 UT) CAPS capsule Take 1 capsule (50,000  Units total) by mouth every 7 (seven) days.  . Zinc 100 MG TABS Take 1 tablet by mouth daily.     Allergies:   Demerol [meperidine hcl] and Aspirin   ROS:   Please see the history of present illness.    All other systems reviewed and are negative.   Labs/Other Tests and Data Reviewed:    Recent Labs: 04/13/2019: ALT 22; BUN 16; Creatinine, Ser 0.79; Hemoglobin 12.5; Platelets 234; Potassium 3.9; Sodium 140 04/18/2019: TSH 1.97   Recent Lipid Panel Lab Results  Component Value Date/Time   CHOL 136 07/08/2017 10:10 AM   TRIG 79 07/08/2017 10:10 AM   HDL 52 07/08/2017  10:10 AM   CHOLHDL 2.6 07/08/2017 10:10 AM   LDLCALC 68 07/08/2017 10:10 AM    Wt Readings from Last 3 Encounters:  05/31/19 195 lb (88.5 kg)  04/13/19 195 lb (88.5 kg)  03/09/19 198 lb (89.8 kg)     Objective:    Vital Signs:  There were no vitals taken for this visit.   GEN:  alert and oriented  RESPIRATORY:  normal breath-no shortness of breath in conversation  PSYCH:  flat affect and depressed mood  Depression screen [facility] 2/9 06/07/2019 05/31/2019 04/18/2019  Decreased Interest 0 0 0  Down, Depressed, Hopeless 3 3 3   PHQ - 2 Score 3 3 3   Altered sleeping 0 0 0  Tired, decreased energy 0 0 3  Change in appetite 2 2 3   Feeling bad or failure about yourself  3 3 3   Trouble concentrating 3 3 3   Moving slowly or fidgety/restless 0 3 0  Suicidal thoughts 0 0 0  PHQ-9 Score 11 14 15   Difficult doing work/chores - Very difficult -  Some recent data might be hidden    ASSESSMENT & PLAN:    1. Depression, recurrent ([place])  2. Anxiety   Time:   Today, I have spent 10 minutes with the patient with telehealth technology discussing the above problems.     Medication Adjustments/Labs and Tests Ordered: Current medicines are reviewed at length with the patient today.  Concerns regarding medicines are outlined above.   Tests Ordered: No orders of the defined types were placed in this encounter.   Medication Changes: No orders of the defined types were placed in this encounter.   Disposition:  Follow up as scheduled  Signed, Perlie Mayo, NP  06/07/2019 10:05 AM     [place]

## 2019-06-07 NOTE — Assessment & Plan Note (Signed)
PHQ is 11 from 14, she is down and reflects being depressed in conversation. Denies SI and HI. Reports she is working on self care. Discussed grounding techniques today. Provided with info on AVS. Close follow up, does have therapy appts coming up. Will be seeing her next month in person for annual

## 2019-06-08 DIAGNOSIS — F4323 Adjustment disorder with mixed anxiety and depressed mood: Secondary | ICD-10-CM | POA: Diagnosis not present

## 2019-06-09 DIAGNOSIS — F4323 Adjustment disorder with mixed anxiety and depressed mood: Secondary | ICD-10-CM | POA: Diagnosis not present

## 2019-06-13 ENCOUNTER — Ambulatory Visit: Payer: BC Managed Care – PPO | Admitting: Psychology

## 2019-06-13 ENCOUNTER — Ambulatory Visit (INDEPENDENT_AMBULATORY_CARE_PROVIDER_SITE_OTHER): Payer: BC Managed Care – PPO | Admitting: Psychology

## 2019-06-13 DIAGNOSIS — F331 Major depressive disorder, recurrent, moderate: Secondary | ICD-10-CM | POA: Diagnosis not present

## 2019-06-16 DIAGNOSIS — F4321 Adjustment disorder with depressed mood: Secondary | ICD-10-CM | POA: Diagnosis not present

## 2019-06-16 DIAGNOSIS — F4323 Adjustment disorder with mixed anxiety and depressed mood: Secondary | ICD-10-CM | POA: Diagnosis not present

## 2019-06-20 DIAGNOSIS — F4323 Adjustment disorder with mixed anxiety and depressed mood: Secondary | ICD-10-CM | POA: Diagnosis not present

## 2019-06-23 DIAGNOSIS — F4321 Adjustment disorder with depressed mood: Secondary | ICD-10-CM | POA: Diagnosis not present

## 2019-06-27 ENCOUNTER — Ambulatory Visit: Payer: BC Managed Care – PPO | Admitting: Psychology

## 2019-06-30 DIAGNOSIS — F4323 Adjustment disorder with mixed anxiety and depressed mood: Secondary | ICD-10-CM | POA: Diagnosis not present

## 2019-07-04 ENCOUNTER — Ambulatory Visit: Payer: BC Managed Care – PPO | Admitting: Family Medicine

## 2019-07-07 ENCOUNTER — Ambulatory Visit (INDEPENDENT_AMBULATORY_CARE_PROVIDER_SITE_OTHER): Payer: BC Managed Care – PPO | Admitting: Family Medicine

## 2019-07-07 ENCOUNTER — Other Ambulatory Visit: Payer: Self-pay

## 2019-07-07 ENCOUNTER — Encounter: Payer: Self-pay | Admitting: Family Medicine

## 2019-07-07 VITALS — BP 124/90 | HR 99 | Temp 97.9°F | Resp 16 | Ht 60.0 in | Wt 198.0 lb

## 2019-07-07 DIAGNOSIS — R5383 Other fatigue: Secondary | ICD-10-CM

## 2019-07-07 DIAGNOSIS — Z1231 Encounter for screening mammogram for malignant neoplasm of breast: Secondary | ICD-10-CM

## 2019-07-07 DIAGNOSIS — F339 Major depressive disorder, recurrent, unspecified: Secondary | ICD-10-CM

## 2019-07-07 DIAGNOSIS — E559 Vitamin D deficiency, unspecified: Secondary | ICD-10-CM

## 2019-07-07 DIAGNOSIS — F419 Anxiety disorder, unspecified: Secondary | ICD-10-CM | POA: Diagnosis not present

## 2019-07-07 DIAGNOSIS — F4323 Adjustment disorder with mixed anxiety and depressed mood: Secondary | ICD-10-CM | POA: Diagnosis not present

## 2019-07-07 DIAGNOSIS — Z0001 Encounter for general adult medical examination with abnormal findings: Secondary | ICD-10-CM | POA: Diagnosis not present

## 2019-07-07 DIAGNOSIS — R030 Elevated blood-pressure reading, without diagnosis of hypertension: Secondary | ICD-10-CM | POA: Insufficient documentation

## 2019-07-07 NOTE — Assessment & Plan Note (Signed)
Most likely related to weight gain and uncontrolled depression and anxiety.  Close follow-up in a week appointment to see if this is continuing.  Advised to maintain a heart healthy low-salt diet and to exercise. Patient acknowledged agreement and understanding of the plan.

## 2019-07-07 NOTE — Assessment & Plan Note (Signed)
Mammogram ordered

## 2019-07-07 NOTE — Assessment & Plan Note (Signed)
Difficult weekend coming up as her husband is moving out of their joint home.  They have going to sign amendment separation papers.  He is going to therapy per her.  She is going to therapy per her.  Is also being seen by [facility].  Advised her to continue medications at this time and to make sure she focuses on self-care. Patient acknowledged agreement and understanding of the plan.  Close follow-up in 8 weeks she is advised to call if she needs anything before then.

## 2019-07-07 NOTE — Assessment & Plan Note (Signed)
Deteriorated, Patricia Becker is re-educated about the importance of exercise daily to help with weight management. A minumum of 30 minutes daily is recommended. Additionally, importance of healthy food choices  with portion control discussed.  Wt Readings from Last 3 Encounters:  07/07/19 198 lb (89.8 kg)  05/31/19 195 lb (88.5 kg)  04/13/19 195 lb (88.5 kg)

## 2019-07-07 NOTE — Assessment & Plan Note (Signed)
Difficult weekend, but as her husband is moving out.  Advised her to continue taking all medications as directed.  Continue therapy at this time.  Close follow-up in 8 weeks.  She denies having any SI or HI at this time.  Reviewed side effects, risks and benefits of medication.   Patient acknowledged agreement and understanding of the plan.

## 2019-07-07 NOTE — Patient Instructions (Addendum)
I appreciate the opportunity to provide you with care for your health and wellness. Today we discussed: overall health   Follow up: 8 weeks   Labs fasting in the next week  Order for Mammogram placed  Please continue to focus on your wellness both physically and mentally. Never hesitate to reach out.  I am proud of you and the steps you have take and continue to take to get better.  Please continue to practice social distancing to keep you, your family, and our community safe.  If you must go out, please wear a mask and practice good handwashing.  It was a pleasure to see you and I look forward to continuing to work together on your health and well-being. Please do not hesitate to call the office if you need care or have questions about your care.  Have a wonderful day and week. With Gratitude, Tereasa Coop, DNP, AGNP-BC   HEALTH MAINTENANCE RECOMMENDATIONS:  It is recommended that you get at least 30 minutes of aerobic exercise at least 5 days/week (for weight loss, you may need as much as 60-90 minutes). This can be any activity that gets your heart rate up. This can be divided in 10-15 minute intervals if needed, but try and build up your endurance at least once a week.  Weight bearing exercise is also recommended twice weekly.  Eat a healthy diet with lots of vegetables, fruits and fiber.  "Colorful" foods have a lot of vitamins (ie green vegetables, tomatoes, red peppers, etc).  Limit sweet tea, regular sodas and alcoholic beverages, all of which has a lot of calories and sugar.  Up to 1 alcoholic drink daily may be beneficial for women (unless trying to lose weight, watch sugars).  Drink a lot of water.  Calcium recommendations are 1200-1500 mg daily (1500 mg for postmenopausal women or women without ovaries), and vitamin D 1000 IU daily.  This should be obtained from diet and/or supplements (vitamins), and calcium should not be taken all at once, but in divided doses.  Monthly  self breast exams and yearly mammograms for women over the age of 90 is recommended.  Sunscreen of at least SPF 30 should be used on all sun-exposed parts of the skin when outside between the hours of 10 am and 4 pm (not just when at beach or pool, but even with exercise, golf, tennis, and yard work!)  Use a sunscreen that says "broad spectrum" so it covers both UVA and UVB rays, and make sure to reapply every 1-2 hours.  Remember to change the batteries in your smoke detectors when changing your clock times in the spring and fall.  Use your seat belt every time you are in a car, and please drive safely and not be distracted with cell phones and texting while driving.

## 2019-07-07 NOTE — Assessment & Plan Note (Signed)
Discussed monthly self breast exams and yearly mammograms; at least 30 minutes of aerobic activity at least 5 days/week and weight-bearing exercise 2x/week; proper sunscreen use reviewed; healthy diet, including goals of calcium and vitamin D intake and alcohol recommendations (less than or equal to 1 drink/day) reviewed; regular seatbelt use; changing batteries in smoke detectors.  Immunization recommendations discussed.  Colonoscopy recommendations reviewed.  

## 2019-07-07 NOTE — Progress Notes (Signed)
Health Maintenance reviewed -   Immunization History  Administered Date(s) Administered  . Influenza,inj,Quad PF,6+ Mos 01/11/2018, 01/17/2019   Last Pap smear: [facility] 02/2019 Last mammogram: Needs to get  Last colonoscopy: not needed yet  Last DEXA: n/a Dentist: needs to set appt Ophtho: updated eye exam needed Exercise: working on this   Other doctors caring for patient include:  Patient Care Team: Freddy Finner, NP as PCP - General (Family Medicine)  End of Life Discussion:  Patient does not have a living will and medical power of attorney    Code Status: Not on file   Subjective:   HPI  Patricia Becker is a 44 y.o. female who presents for annual wellness visit and follow-up on chronic medical conditions.  She has the following concerns: no new concerns today. Reports taking medications as directed. Does disclose that husband is moving out this weekend for full separation and she reports this is very hard, but needed. She is still working on self care, going to [facility] for medication and therapy as well.  Past Medical, Surgical, Social History, Allergies, and Medications have been Reviewed.   Review Of Systems  Review of Systems  Constitutional: Negative.   HENT: Negative.   Eyes: Negative.   Respiratory: Negative.   Cardiovascular: Negative.   Gastrointestinal: Negative.   Endocrine: Negative.   Genitourinary: Negative.   Musculoskeletal: Negative.   Skin: Negative.   Neurological: Negative.   Hematological: Negative.   Psychiatric/Behavioral: The patient is nervous/anxious.     Objective:   PHYSICAL EXAM:  BP 124/90   Pulse 99   Temp 97.9 F (36.6 C) (Temporal)   Resp 16   Ht 5' (1.524 m)   Wt 198 lb (89.8 kg)   LMP  (Within Weeks)   SpO2 99%   BMI 38.67 kg/m   Physical Exam Vitals and nursing note reviewed.  Constitutional:      Appearance: Normal appearance. She is well-developed and well-groomed. She is obese.  HENT:   Head: Normocephalic.     Right Ear: Hearing, tympanic membrane, ear canal and external ear normal.     Left Ear: Hearing, tympanic membrane, ear canal and external ear normal.     Ears:     Weber exam findings: does not lateralize.    Right Rinne: AC > BC.    Left Rinne: AC > BC.    Mouth/Throat:     Comments: Mask in place  Eyes:     General: Lids are normal.     Extraocular Movements: Extraocular movements intact.     Conjunctiva/sclera: Conjunctivae normal.     Pupils: Pupils are equal, round, and reactive to light.  Neck:     Thyroid: No thyroid mass or thyromegaly.  Cardiovascular:     Rate and Rhythm: Normal rate and regular rhythm.     Pulses: Normal pulses.          Radial pulses are 2+ on the right side and 2+ on the left side.       Dorsalis pedis pulses are 2+ on the right side and 2+ on the left side.     Heart sounds: Normal heart sounds.  Pulmonary:     Effort: Pulmonary effort is normal.     Breath sounds: Normal breath sounds.  Abdominal:     General: Abdomen is flat. Bowel sounds are normal.     Palpations: Abdomen is soft.  Musculoskeletal:        General: Normal  range of motion.     Cervical back: Full passive range of motion without pain, normal range of motion and neck supple.     Right lower leg: No edema.     Left lower leg: No edema.  Lymphadenopathy:     Cervical: No cervical adenopathy.  Skin:    General: Skin is warm and dry.     Capillary Refill: Capillary refill takes less than 2 seconds.  Neurological:     General: No focal deficit present.     Mental Status: She is alert and oriented to person, place, and time. Mental status is at baseline.     Cranial Nerves: Cranial nerves are intact.     Sensory: Sensation is intact.     Motor: Motor function is intact.     Coordination: Coordination is intact.     Gait: Gait is intact.     Deep Tendon Reflexes: Reflexes are normal and symmetric.  Psychiatric:        Attention and Perception: Attention  and perception normal.        Mood and Affect: Mood is depressed. Affect is tearful.        Speech: Speech normal.        Behavior: Behavior normal. Behavior is cooperative.        Thought Content: Thought content normal.        Cognition and Memory: Cognition and memory normal.        Judgment: Judgment normal.    Depression Screening  Depression screen [facility] 2/9 07/07/2019 06/07/2019 05/31/2019 04/18/2019 03/09/2019  Decreased Interest 2 0 0 0 0  Down, Depressed, Hopeless 3 3 3 3  0  PHQ - 2 Score 5 3 3 3  0  Altered sleeping 0 0 0 0 0  Tired, decreased energy 3 0 0 3 0  Change in appetite 3 2 2 3  0  Feeling bad or failure about yourself  3 3 3 3 1   Trouble concentrating 3 3 3 3  0  Moving slowly or fidgety/restless 2 0 3 0 0  Suicidal thoughts 0 0 0 0 0  PHQ-9 Score 19 11 14 15 1   Difficult doing work/chores Very difficult Very difficult Very difficult - Not difficult at all  Some recent data might be hidden    GAD 7 : Generalized Anxiety Score 07/07/2019 03/01/2019 01/11/2018 09/17/2017  Nervous, Anxious, on Edge 3 3 2 3   Control/stop worrying 2 0 2 2  Worry too much - different things 1 0 2 3  Trouble relaxing 2 2 3 3   Restless 2 2 2 2   Easily annoyed or irritable 2 3 2 3   Afraid - awful might happen 0 0 1 1  Total GAD 7 Score 12 10 14 17   Anxiety Difficulty Very difficult Very difficult Very difficult Somewhat difficult    Assessment & Plan:   1. Annual visit for general adult medical examination with abnormal findings   2. Depression, recurrent (HCC)   3. Morbid obesity (HCC)   4. Anxiety   5. Fatigue, unspecified type   6. Encounter for screening mammogram for malignant neoplasm of breast   7. Vitamin D deficiency   8. Prehypertension     Tests ordered Orders Placed This Encounter  Procedures  . MM 3D SCREEN BREAST BILATERAL  . CBC  . COMPLETE METABOLIC PANEL WITH GFR  . Hemoglobin A1c  . Lipid panel  . TSH  . VITAMIN D 25 Hydroxy (Vit-D Deficiency, Fractures)  Plan: Please see assessment and plan per problem list above.   No orders of the defined types were placed in this encounter.   The patient's weight, height, BMI, and visual acuity have been recorded in the chart.  I have made referrals, counseling, and provided education to the patient based on review of the above and I have provided the patient with a written personalized care plan for preventive services.     Perlie Mayo, NP   07/07/2019

## 2019-07-07 NOTE — Assessment & Plan Note (Signed)
Previous history of vitamin D deficiency.  Will be checking labs to see if she needs supplementation.  Advised her to make sure she is eating a well-balanced diet and try to take better care of herself.

## 2019-07-07 NOTE — Assessment & Plan Note (Signed)
Ongoing battles with fatigue possibly related to uncontrolled and previously not treated depression.  Will be checking levels of vitamin D and have encouraged her to continue therapy and medication treatment for depression at this time.    Reviewed side effects, risks and benefits of medication.   Patient acknowledged agreement and understanding of the plan.

## 2019-07-10 DIAGNOSIS — F4323 Adjustment disorder with mixed anxiety and depressed mood: Secondary | ICD-10-CM | POA: Diagnosis not present

## 2019-07-10 DIAGNOSIS — F3289 Other specified depressive episodes: Secondary | ICD-10-CM | POA: Diagnosis not present

## 2019-07-10 DIAGNOSIS — F438 Other reactions to severe stress: Secondary | ICD-10-CM | POA: Diagnosis not present

## 2019-07-10 DIAGNOSIS — F329 Major depressive disorder, single episode, unspecified: Secondary | ICD-10-CM | POA: Diagnosis not present

## 2019-07-10 DIAGNOSIS — F419 Anxiety disorder, unspecified: Secondary | ICD-10-CM | POA: Diagnosis not present

## 2019-07-17 ENCOUNTER — Ambulatory Visit (HOSPITAL_COMMUNITY): Payer: BC Managed Care – PPO

## 2019-07-29 DIAGNOSIS — F4323 Adjustment disorder with mixed anxiety and depressed mood: Secondary | ICD-10-CM | POA: Diagnosis not present

## 2019-07-31 ENCOUNTER — Other Ambulatory Visit: Payer: Self-pay | Admitting: *Deleted

## 2019-07-31 DIAGNOSIS — F419 Anxiety disorder, unspecified: Secondary | ICD-10-CM

## 2019-07-31 MED ORDER — BUSPIRONE HCL 5 MG PO TABS: 5.0000 mg | ORAL_TABLET | Freq: Two times a day (BID) | ORAL | 1 refills | Status: DC

## 2019-08-03 ENCOUNTER — Ambulatory Visit: Payer: BC Managed Care – PPO | Attending: Internal Medicine

## 2019-08-11 DIAGNOSIS — F3289 Other specified depressive episodes: Secondary | ICD-10-CM | POA: Diagnosis not present

## 2019-08-11 DIAGNOSIS — F419 Anxiety disorder, unspecified: Secondary | ICD-10-CM | POA: Diagnosis not present

## 2019-08-11 DIAGNOSIS — F438 Other reactions to severe stress: Secondary | ICD-10-CM | POA: Diagnosis not present

## 2019-08-25 DIAGNOSIS — F4323 Adjustment disorder with mixed anxiety and depressed mood: Secondary | ICD-10-CM | POA: Diagnosis not present

## 2019-08-25 DIAGNOSIS — F419 Anxiety disorder, unspecified: Secondary | ICD-10-CM | POA: Diagnosis not present

## 2019-08-25 DIAGNOSIS — F3289 Other specified depressive episodes: Secondary | ICD-10-CM | POA: Diagnosis not present

## 2019-08-25 DIAGNOSIS — F438 Other reactions to severe stress: Secondary | ICD-10-CM | POA: Diagnosis not present

## 2019-08-29 ENCOUNTER — Encounter: Payer: Self-pay | Admitting: Family Medicine

## 2019-08-29 ENCOUNTER — Other Ambulatory Visit: Payer: Self-pay

## 2019-08-29 ENCOUNTER — Ambulatory Visit (INDEPENDENT_AMBULATORY_CARE_PROVIDER_SITE_OTHER): Payer: BC Managed Care – PPO | Admitting: Family Medicine

## 2019-08-29 VITALS — BP 104/74 | HR 95 | Temp 97.2°F | Resp 15 | Ht 60.0 in | Wt 194.8 lb

## 2019-08-29 DIAGNOSIS — F419 Anxiety disorder, unspecified: Secondary | ICD-10-CM | POA: Diagnosis not present

## 2019-08-29 DIAGNOSIS — F321 Major depressive disorder, single episode, moderate: Secondary | ICD-10-CM | POA: Diagnosis not present

## 2019-08-29 MED ORDER — BUSPIRONE HCL 10 MG PO TABS: 10.0000 mg | ORAL_TABLET | Freq: Three times a day (TID) | ORAL | 0 refills | Status: DC

## 2019-08-29 MED ORDER — BUPROPION HCL ER (XL) 300 MG PO TB24: 300.0000 mg | ORAL_TABLET | Freq: Every day | ORAL | 1 refills | Status: DC

## 2019-08-29 NOTE — Progress Notes (Signed)
Subjective:  Patient ID: Patricia Becker, female    DOB: 1976-05-07  Age: 44 y.o. MRN: [id number]  CC:  Chief Complaint  Patient presents with  . Anxiety    with her current meds the depression has gotten worse and thinks she needs her meds adjusted   . Depression      HPI  HPI  Patricia Becker is a 44 year old female patient of mine. She has had a history of anxiety and depression. Today she reports for follow up for both. She her medications have made things worse for her.  Wondering if she can do an adjustment. Does not have follow-up with [facility] for another couple weeks.  Denies having any suicidal or homicidal reports that she does that she gets mixed signals from her husband who moved out back in February.  They talk frequently but that he reports that they are getting back together.  This is very stressful for her as she would like to make things work out. She denies much self-care but is trying to get outside in the sunshine at least 5 to 10 minutes a day.  Is working for 12 hours this week at this time.  Today patient denies signs and symptoms of COVID 19 infection including fever, chills, cough, shortness of breath, and headache. Past Medical, Surgical, Social History, Allergies, and Medications have been Reviewed.   Past Medical History:  Diagnosis Date  . Anxiety   . Anxiety   . Depression   . Depression   . Encounter for screening for HIV 07/08/2017  . Polypharmacy 07/08/2017    Current Meds  Medication Sig  . ARIPiprazole (ABILIFY) 5 MG tablet Take 5 mg by mouth daily.  . Ascorbic Acid (VITAMIN C GUMMIE PO) Take 1 tablet by mouth daily.  . busPIRone (BUSPAR) 10 MG tablet Take 1 tablet (10 mg total) by mouth 3 (three) times daily.  . hydrOXYzine (VISTARIL) 25 MG capsule Take 25 mg by mouth 3 (three) times daily.  . Melatonin 10 MG TABS Take 10 mg by mouth at bedtime as needed (sleep).  . Multiple Vitamin (MULTIVITAMIN) tablet Take 1 tablet by mouth daily.  .  Vitamin D, Ergocalciferol, (DRISDOL) 1.25 MG (50000 UT) CAPS capsule Take 1 capsule (50,000 Units total) by mouth every 7 (seven) days.  . Zinc 100 MG TABS Take 1 tablet by mouth daily.  . [DISCONTINUED] buPROPion (WELLBUTRIN SR) 150 MG 12 hr tablet Take 150 mg by mouth daily.  . [DISCONTINUED] busPIRone (BUSPAR) 5 MG tablet Take 1 tablet (5 mg total) by mouth 2 (two) times daily.    ROS:  Review of Systems  Constitutional: Negative.   HENT: Negative.   Eyes: Negative.   Respiratory: Negative.   Cardiovascular: Negative.   Gastrointestinal: Negative.   Genitourinary: Negative.   Musculoskeletal: Negative.   Skin: Negative.   Neurological: Negative.   Endo/Heme/Allergies: Negative.   Psychiatric/Behavioral: Positive for depression. Negative for suicidal ideas. The patient is nervous/anxious and has insomnia.   All other systems reviewed and are negative.    Objective:   Today's Vitals: BP 104/74   Pulse 95   Temp (!) 97.2 F (36.2 C) (Temporal)   Resp 15   Ht 5' (1.524 m)   Wt 194 lb 12.8 oz (88.4 kg)   SpO2 98%   BMI 38.04 kg/m  Vitals with BMI 08/29/2019 07/07/2019 05/31/2019  Height 5\' 0"  5\' 0"  5\' 0"   Weight 194 lbs 13 oz 198 lbs 195 lbs  BMI 38.04 38.67 38.08  Systolic 104 124 -  Diastolic 74 90 -  Pulse 95 99 -     Physical Exam Vitals and nursing note reviewed.  Constitutional:      Appearance: Normal appearance. She is well-developed and well-groomed. She is obese.  HENT:     Head: Normocephalic and atraumatic.     Right Ear: External ear normal.     Left Ear: External ear normal.     Mouth/Throat:     Comments: Mask in place Eyes:     General:        Right eye: No discharge.        Left eye: No discharge.     Conjunctiva/sclera: Conjunctivae normal.  Cardiovascular:     Rate and Rhythm: Normal rate and regular rhythm.     Pulses: Normal pulses.     Heart sounds: Normal heart sounds.  Pulmonary:     Effort: Pulmonary effort is normal.     Breath  sounds: Normal breath sounds.  Musculoskeletal:        General: Normal range of motion.     Cervical back: Normal range of motion and neck supple.  Skin:    General: Skin is warm.  Neurological:     General: No focal deficit present.     Mental Status: She is alert and oriented to person, place, and time.  Psychiatric:        Attention and Perception: Attention normal.        Mood and Affect: Mood is depressed. Affect is tearful.        Speech: Speech normal.        Behavior: Behavior normal. Behavior is cooperative.        Thought Content: Thought content normal.        Cognition and Memory: Cognition normal.        Judgment: Judgment normal.      Depression screen [facility] 2/9 08/29/2019 07/07/2019 06/07/2019 05/31/2019 04/18/2019  Decreased Interest 3 2 0 0 0  Down, Depressed, Hopeless 3 3 3 3 3   PHQ - 2 Score 6 5 3 3 3   Altered sleeping 3 0 0 0 0  Tired, decreased energy 3 3 0 0 3  Change in appetite 2 3 2 2 3   Feeling bad or failure about yourself  3 3 3 3 3   Trouble concentrating 3 3 3 3 3   Moving slowly or fidgety/restless 2 2 0 3 0  Suicidal thoughts 1 0 0 0 0  PHQ-9 Score 23 19 11 14 15   Difficult doing work/chores Very difficult Very difficult Very difficult Very difficult -  Some recent data might be hidden    GAD 7 : Generalized Anxiety Score 08/29/2019 07/07/2019 03/01/2019 01/11/2018  Nervous, Anxious, on Edge 3 3 3 2   Control/stop worrying 3 2 0 2  Worry too much - different things 3 1 0 2  Trouble relaxing 3 2 2 3   Restless 2 2 2 2   Easily annoyed or irritable 0 2 3 2   Afraid - awful might happen 3 0 0 1  Total GAD 7 Score 17 12 10 14   Anxiety Difficulty Very difficult Very difficult Very difficult Very difficult       Assessment   1. Depression, major, single episode, moderate (HCC)   2. Anxiety     Tests ordered No orders of the defined types were placed in this encounter.    Plan: Please see assessment and plan per problem list  above.   Meds  ordered this encounter  Medications  . buPROPion (WELLBUTRIN XL) 300 MG 24 hr tablet    Sig: Take 1 tablet (300 mg total) by mouth daily.    Dispense:  30 tablet    Refill:  1    Order Specific Question:   Supervising Provider    Answer:   SIMPSON, MARGARET E [[id number]]  . busPIRone (BUSPAR) 10 MG tablet    Sig: Take 1 tablet (10 mg total) by mouth 3 (three) times daily.    Dispense:  90 tablet    Refill:  0    Order Specific Question:   Supervising Provider    Answer:   Fayrene Helper [[id number]]    Patient to follow-up in 2 weeks.   Perlie Mayo, NP

## 2019-08-29 NOTE — Patient Instructions (Addendum)
I appreciate the opportunity to provide you with care for your health and wellness. Today we discussed: mood  Follow up: 2 weeks mychart video   No labs or referrals today  Focus on self care daily-sunshine on your face and walking. Never give up on yourself.  Please continue to practice social distancing to keep you, your family, and our community safe.  If you must go out, please wear a mask and practice good handwashing.  It was a pleasure to see you and I look forward to continuing to work together on your health and well-being. Please do not hesitate to call the office if you need care or have questions about your care.  Have a wonderful day and week. With Gratitude, Tereasa Coop, DNP, AGNP-BC

## 2019-08-29 NOTE — Assessment & Plan Note (Signed)
Denies SI and HI Increased depression levels with PHQ 23 Increased Wellbutrin close follow up 2 weeks  Reviewed side effects, risks and benefits of medication.   Patient acknowledged agreement and understanding of the plan.

## 2019-08-29 NOTE — Assessment & Plan Note (Signed)
Denies SI and HI GAD elevated: increased Buspar to 10 mg TID and Wellbutrin to 300 mg  Close follow up 2 weeks

## 2019-09-01 ENCOUNTER — Ambulatory Visit: Payer: BC Managed Care – PPO | Admitting: Family Medicine

## 2019-09-04 DIAGNOSIS — F419 Anxiety disorder, unspecified: Secondary | ICD-10-CM | POA: Diagnosis not present

## 2019-09-04 DIAGNOSIS — F438 Other reactions to severe stress: Secondary | ICD-10-CM | POA: Diagnosis not present

## 2019-09-04 DIAGNOSIS — F3289 Other specified depressive episodes: Secondary | ICD-10-CM | POA: Diagnosis not present

## 2019-09-07 DIAGNOSIS — F4323 Adjustment disorder with mixed anxiety and depressed mood: Secondary | ICD-10-CM | POA: Diagnosis not present

## 2019-09-12 ENCOUNTER — Encounter: Payer: Self-pay | Admitting: Family Medicine

## 2019-09-15 ENCOUNTER — Encounter: Payer: Self-pay | Admitting: Family Medicine

## 2019-09-15 ENCOUNTER — Telehealth (INDEPENDENT_AMBULATORY_CARE_PROVIDER_SITE_OTHER): Payer: BC Managed Care – PPO | Admitting: Family Medicine

## 2019-09-15 ENCOUNTER — Other Ambulatory Visit: Payer: Self-pay

## 2019-09-15 VITALS — BP 104/74 | Ht 60.0 in | Wt 194.0 lb

## 2019-09-15 DIAGNOSIS — F419 Anxiety disorder, unspecified: Secondary | ICD-10-CM

## 2019-09-15 DIAGNOSIS — F321 Major depressive disorder, single episode, moderate: Secondary | ICD-10-CM | POA: Diagnosis not present

## 2019-09-15 MED ORDER — ARIPIPRAZOLE 5 MG PO TABS: 5.0000 mg | ORAL_TABLET | Freq: Every day | ORAL | 0 refills | Status: DC

## 2019-09-15 NOTE — Assessment & Plan Note (Signed)
GAD scores of same.  We will continue BuSpar and Wellbutrin and Abilify as directed.  Denies having any SI or HI at this time.  Have strongly encouraged self-care aspects

## 2019-09-15 NOTE — Assessment & Plan Note (Signed)
Denies having any SI and HI.  Slight decrease in PHQ to 21.  Continue Wellbutrin, BuSpar, Abilify as directed.  Advised for her to make sure she keeps follow-up with [facility].  Encouraged her to get self-care as much as possible trying to get into a pattern and routine.  Follow-up in 8 weeks advised for her to follow-up with [place] if she needs a sooner.

## 2019-09-15 NOTE — Progress Notes (Signed)
Virtual Visit via Video Note   This visit type was conducted due to national recommendations for restrictions regarding the COVID-19 Pandemic (e.g. social distancing) in an effort to limit this patient's exposure and mitigate transmission in our community.  Due to her co-morbid illnesses, this patient is at least at moderate risk for complications without adequate follow up.  This format is felt to be most appropriate for this patient at this time.  All issues noted in this document were discussed and addressed.  A limited physical exam was performed with this format.   Evaluation Performed:  Follow-up visit  Date:  09/15/2019   ID:  Patricia Becker, DOB 02/18/1976, MRN [id number]  Patient Location: Home Provider Location: Office  Location of Patient: Home Location of Provider: Telehealth Consent was obtain for visit to be over via telehealth. I verified that I am speaking with the correct person using two identifiers.  PCP:  Freddy Finner, NP   Chief Complaint: Anxiety and depression  History of Present Illness:    Patricia Becker is a 44 y.o. female with anxiety and depression history.  Follow-up today secondary to change in medications at last visit.  She has a follow-up with [facility] in another week or so.  She denies having any suicidal homicidal ideations.  Reports that she is still struggling with her husband who is now starting to date.  They are separated.  She denies doing much self-care and knows that she needs to get more of this.  She reports that she is trying to work as much as possible as it helps keep her mind off things.  Worked for 12-hour shifts this week.  Increasing medication she reports is done better for her.  She is about to run out of her Abilify and she is having trouble getting it refilled before next appointment she wants to know if I am able to refill it this month.  Before she runs out.  The patient does not have symptoms concerning for COVID-19  infection (fever, chills, cough, or new shortness of breath).   Past Medical, Surgical, Social History, Allergies, and Medications have been Reviewed.  Past Medical History:  Diagnosis Date  . Anxiety   . Anxiety   . Depression   . Depression   . Encounter for screening for HIV 07/08/2017  . Polypharmacy 07/08/2017   Past Surgical History:  Procedure Laterality Date  . CESAREAN SECTION    . COLONOSCOPY     2017, normal.   . KNEE SURGERY  1981  . TUBAL LIGATION       Current Meds  Medication Sig  . ARIPiprazole (ABILIFY) 5 MG tablet Take 5 mg by mouth daily.  . Ascorbic Acid (VITAMIN C GUMMIE PO) Take 1 tablet by mouth daily.  Marland Kitchen buPROPion (WELLBUTRIN XL) 300 MG 24 hr tablet Take 1 tablet (300 mg total) by mouth daily.  . busPIRone (BUSPAR) 10 MG tablet Take 1 tablet (10 mg total) by mouth 3 (three) times daily.  . hydrOXYzine (VISTARIL) 25 MG capsule Take 25 mg by mouth 3 (three) times daily.  . Melatonin 10 MG TABS Take 10 mg by mouth at bedtime as needed (sleep).  . Multiple Vitamin (MULTIVITAMIN) tablet Take 1 tablet by mouth daily.  . Vitamin D, Ergocalciferol, (DRISDOL) 1.25 MG (50000 UT) CAPS capsule Take 1 capsule (50,000 Units total) by mouth every 7 (seven) days.  . Zinc 100 MG TABS Take 1 tablet by mouth daily.     Allergies:  Demerol [meperidine hcl], Meperidine, and Aspirin   ROS:   Please see the history of present illness.    All other systems reviewed and are negative.   Labs/Other Tests and Data Reviewed:    Recent Labs: 04/13/2019: ALT 22; BUN 16; Creatinine, Ser 0.79; Hemoglobin 12.5; Platelets 234; Potassium 3.9; Sodium 140 04/18/2019: TSH 1.97   Recent Lipid Panel Lab Results  Component Value Date/Time   CHOL 136 07/08/2017 10:10 AM   TRIG 79 07/08/2017 10:10 AM   HDL 52 07/08/2017 10:10 AM   CHOLHDL 2.6 07/08/2017 10:10 AM   LDLCALC 68 07/08/2017 10:10 AM    Wt Readings from Last 3 Encounters:  09/15/19 194 lb (88 kg)  08/29/19 194 lb 12.8  oz (88.4 kg)  07/07/19 198 lb (89.8 kg)     Objective:    Vital Signs:  BP 104/74   Ht 5' (1.524 m)   Wt 194 lb (88 kg)   LMP 09/05/2019 (Approximate)   BMI 37.89 kg/m    VITAL SIGNS:  reviewed GEN:  no acute distress EYES:  sclerae anicteric, EOMI - Extraocular Movements Intact RESPIRATORY:  normal respiratory effort, symmetric expansion SKIN:  no rash, lesions or ulcers. MUSCULOSKELETAL:  no obvious deformities. NEURO:  alert and oriented x 3, no obvious focal deficit PSYCH:  normal affect  Depression screen [facility] 2/9 09/15/2019 08/29/2019 07/07/2019 06/07/2019 05/31/2019  Decreased Interest 2 3 2  0 0  Down, Depressed, Hopeless 3 3 3 3 3   PHQ - 2 Score 5 6 5 3 3   Altered sleeping 3 3 0 0 0  Tired, decreased energy 2 3 3  0 0  Change in appetite 3 2 3 2 2   Feeling bad or failure about yourself  3 3 3 3 3   Trouble concentrating 2 3 3 3 3   Moving slowly or fidgety/restless 3 2 2  0 3  Suicidal thoughts 0 1 0 0 0  PHQ-9 Score 21 23 19 11 14   Difficult doing work/chores Extremely dIfficult Very difficult Very difficult Very difficult Very difficult  Some recent data might be hidden   GAD 7 : Generalized Anxiety Score 09/15/2019 08/29/2019 07/07/2019 03/01/2019  Nervous, Anxious, on Edge 3 3 3 3   Control/stop worrying 3 3 2  0  Worry too much - different things 3 3 1  0  Trouble relaxing 3 3 2 2   Restless 2 2 2 2   Easily annoyed or irritable 0 0 2 3  Afraid - awful might happen 3 3 0 0  Total GAD 7 Score 17 17 12 10   Anxiety Difficulty Extremely difficult Very difficult Very difficult Very difficult    ASSESSMENT & PLAN:    1. Depression, major, single episode, moderate (HCC)  - ARIPiprazole (ABILIFY) 5 MG tablet; Take 1 tablet (5 mg total) by mouth daily.  Dispense: 30 tablet; Refill: 0  2. Anxiety    Time:   Today, I have spent 15 minutes with the patient with telehealth technology discussing the above problems.     Medication Adjustments/Labs and Tests Ordered: Current  medicines are reviewed at length with the patient today.  Concerns regarding medicines are outlined above.   Tests Ordered: No orders of the defined types were placed in this encounter.   Medication Changes: No orders of the defined types were placed in this encounter.   Disposition:  Follow up 8 weeks Signed, Perlie Mayo, NP  09/15/2019 11:21 AM     [facility]

## 2019-09-15 NOTE — Patient Instructions (Addendum)
I appreciate the opportunity to provide you with care for your health and wellness. Today we discussed: anxiety and depression  Follow up: 8 weeks   No labs or referrals today  Continue all medications as prescribed  Work on self care  Please continue to practice social distancing to keep you, your family, and our community safe.  If you must go out, please wear a mask and practice good handwashing.  It was a pleasure to see you and I look forward to continuing to work together on your health and well-being. Please do not hesitate to call the office if you need care or have questions about your care.  Have a wonderful day and week. With Gratitude, Tereasa Coop, DNP, AGNP-BC

## 2019-09-18 DIAGNOSIS — F3289 Other specified depressive episodes: Secondary | ICD-10-CM | POA: Diagnosis not present

## 2019-09-18 DIAGNOSIS — F419 Anxiety disorder, unspecified: Secondary | ICD-10-CM | POA: Diagnosis not present

## 2019-09-18 DIAGNOSIS — F438 Other reactions to severe stress: Secondary | ICD-10-CM | POA: Diagnosis not present

## 2019-09-21 ENCOUNTER — Other Ambulatory Visit: Payer: Self-pay | Admitting: *Deleted

## 2019-09-21 MED ORDER — VITAMIN D (ERGOCALCIFEROL) 1.25 MG (50000 UNIT) PO CAPS: 50000.0000 [IU] | ORAL_CAPSULE | ORAL | 1 refills | Status: DC

## 2019-09-26 DIAGNOSIS — F4323 Adjustment disorder with mixed anxiety and depressed mood: Secondary | ICD-10-CM | POA: Diagnosis not present

## 2019-10-02 DIAGNOSIS — F4323 Adjustment disorder with mixed anxiety and depressed mood: Secondary | ICD-10-CM | POA: Diagnosis not present

## 2019-10-06 ENCOUNTER — Other Ambulatory Visit: Payer: Self-pay

## 2019-10-06 ENCOUNTER — Ambulatory Visit (HOSPITAL_COMMUNITY)
Admission: RE | Admit: 2019-10-06 | Discharge: 2019-10-06 | Disposition: A | Discharge status: Home / Self Care | Payer: BC Managed Care – PPO | Source: Ambulatory Visit | Attending: Family Medicine | Admitting: Family Medicine

## 2019-10-06 DIAGNOSIS — Z1231 Encounter for screening mammogram for malignant neoplasm of breast: Secondary | ICD-10-CM | POA: Diagnosis not present

## 2019-10-11 ENCOUNTER — Other Ambulatory Visit (HOSPITAL_COMMUNITY): Payer: Self-pay | Admitting: Family Medicine

## 2019-10-11 ENCOUNTER — Inpatient Hospital Stay
Admission: RE | Admit: 2019-10-11 | Discharge: 2019-10-11 | Disposition: A | Discharge status: Home / Self Care | Payer: Self-pay | Source: Ambulatory Visit | Attending: Family Medicine | Admitting: Family Medicine

## 2019-10-11 DIAGNOSIS — R928 Other abnormal and inconclusive findings on diagnostic imaging of breast: Secondary | ICD-10-CM

## 2019-10-12 ENCOUNTER — Other Ambulatory Visit: Payer: Self-pay | Admitting: Family Medicine

## 2019-10-12 ENCOUNTER — Other Ambulatory Visit: Payer: Self-pay | Admitting: Emergency Medicine

## 2019-10-12 ENCOUNTER — Telehealth: Payer: Self-pay

## 2019-10-12 DIAGNOSIS — R928 Other abnormal and inconclusive findings on diagnostic imaging of breast: Secondary | ICD-10-CM

## 2019-10-12 DIAGNOSIS — F321 Major depressive disorder, single episode, moderate: Secondary | ICD-10-CM

## 2019-10-12 DIAGNOSIS — F4323 Adjustment disorder with mixed anxiety and depressed mood: Secondary | ICD-10-CM | POA: Diagnosis not present

## 2019-10-12 MED ORDER — ARIPIPRAZOLE 5 MG PO TABS: 5.0000 mg | ORAL_TABLET | Freq: Every day | ORAL | 0 refills | Status: DC

## 2019-10-12 NOTE — Telephone Encounter (Signed)
Call was transferred to [facility] and she spoke with patient

## 2019-10-12 NOTE — Telephone Encounter (Signed)
Pt is calling back regarding Mammo

## 2019-10-20 DIAGNOSIS — F4323 Adjustment disorder with mixed anxiety and depressed mood: Secondary | ICD-10-CM | POA: Diagnosis not present

## 2019-10-26 ENCOUNTER — Other Ambulatory Visit: Payer: Self-pay | Admitting: Family Medicine

## 2019-10-26 DIAGNOSIS — F419 Anxiety disorder, unspecified: Secondary | ICD-10-CM

## 2019-10-26 MED ORDER — BUSPIRONE HCL 10 MG PO TABS: 10.0000 mg | ORAL_TABLET | Freq: Three times a day (TID) | ORAL | 0 refills | Status: DC

## 2019-10-27 ENCOUNTER — Encounter: Payer: Self-pay | Admitting: Family Medicine

## 2019-11-07 ENCOUNTER — Other Ambulatory Visit: Payer: Self-pay

## 2019-11-07 ENCOUNTER — Encounter: Payer: Self-pay | Admitting: Family Medicine

## 2019-11-07 ENCOUNTER — Ambulatory Visit (INDEPENDENT_AMBULATORY_CARE_PROVIDER_SITE_OTHER): Payer: BC Managed Care – PPO | Admitting: Family Medicine

## 2019-11-07 VITALS — BP 117/79 | HR 78 | Temp 97.1°F | Resp 16 | Ht 60.0 in | Wt 174.0 lb

## 2019-11-07 DIAGNOSIS — F321 Major depressive disorder, single episode, moderate: Secondary | ICD-10-CM | POA: Diagnosis not present

## 2019-11-07 DIAGNOSIS — F419 Anxiety disorder, unspecified: Secondary | ICD-10-CM | POA: Diagnosis not present

## 2019-11-07 DIAGNOSIS — F4323 Adjustment disorder with mixed anxiety and depressed mood: Secondary | ICD-10-CM | POA: Diagnosis not present

## 2019-11-07 MED ORDER — BUPROPION HCL ER (XL) 300 MG PO TB24: 300.0000 mg | ORAL_TABLET | Freq: Every day | ORAL | 0 refills | Status: DC

## 2019-11-07 MED ORDER — HYDROXYZINE PAMOATE 25 MG PO CAPS: 25.0000 mg | ORAL_CAPSULE | Freq: Three times a day (TID) | ORAL | 2 refills | Status: DC

## 2019-11-07 NOTE — Patient Instructions (Signed)
I appreciate the opportunity to provide you with care for your health and wellness. Today we discussed: overall mood   Follow up: 8 weeks   No labs or referrals today  I am proud of you. I am happy you are being persistent with getting yourself on track and starting to feel better. Continue to focus on what you need for you. I am happy that you have lost weight, just make sure you are eating a well balanced diet to do it safely.  I will be praying that your daughters melanoma comes back without mets.  Please continue to practice social distancing to keep you, your family, and our community safe.  If you must go out, please wear a mask and practice good handwashing.  It was a pleasure to see you and I look forward to continuing to work together on your health and well-being. Please do not hesitate to call the office if you need care or have questions about your care.  Have a wonderful day and week. With Gratitude, Tereasa Coop, DNP, AGNP-BC

## 2019-11-07 NOTE — Assessment & Plan Note (Signed)
Denies having any SI or HI today in the office.  Overall reports that she is doing much better and is starting to accept things that are going on her life.  She reports that she has left [facility] as she was not able to get the appointments in with her schedule.  She is continuing to take her medications as directed.  Is open to referral in [place] so that she can do virtual visits easier around her schedule.  She is encouraged to continue self-care.  She is also encouraged to make sure that she is eating well-balanced diet as she has lost 20 pounds in the last few months. 8-week follow-up advised to notify or call if she needs a sooner.

## 2019-11-07 NOTE — Assessment & Plan Note (Signed)
GAD has improved slightly.  Continue BuSpar, Wellbutrin, Abilify, hydroxyzine as needed.  Denies any SI or HI at this time.  Continue to focus on self-care aspects.  Referral to [facility] psychiatry to help with management.

## 2019-11-07 NOTE — Progress Notes (Signed)
Subjective:  Patient ID: Patricia Becker, female    DOB: 1975/12/15  Age: 44 y.o. MRN: [id number]  CC:  Chief Complaint  Patient presents with  . Follow-up    8 week follow up anxiety is not going to [facility] anymore so would like to know if you could refill meds       HPI  HPI Patricia Becker is a 44 year old female patient of mine.  With a history of anxiety and depression.  She is following up secondary to medications reviewed.  As well as not going to [facility] anymore secondary to having difficulties fitting it into her schedule.  She denies having any suicidal homicidal ideations.  This is the first visit she has had a months where she did not breakdown tearfully in the office.  She reports that she is starting to accept things for what they are.  She is still struggling with her husband who is dating now.  And they are separated.  She denies doing self-care like she needs to.  Not really eating much has lost 40 pounds since April.  But she reports that she will try to do a bit better but this.  She is also a bit concerned with her oldest daughter having found melanoma and question whether or not has been mets.  Today patient denies signs and symptoms of COVID 19 infection including fever, chills, cough, shortness of breath, and headache. Past Medical, Surgical, Social History, Allergies, and Medications have been Reviewed.   Past Medical History:  Diagnosis Date  . Anxiety   . Anxiety   . Depression   . Depression   . Encounter for screening for HIV 07/08/2017  . Polypharmacy 07/08/2017    Current Meds  Medication Sig  . ARIPiprazole (ABILIFY) 5 MG tablet Take 1 tablet (5 mg total) by mouth daily.  Marland Kitchen buPROPion (WELLBUTRIN XL) 300 MG 24 hr tablet Take 1 tablet (300 mg total) by mouth daily.  . busPIRone (BUSPAR) 10 MG tablet Take 1 tablet (10 mg total) by mouth 3 (three) times daily.  . hydrOXYzine (VISTARIL) 25 MG capsule Take 1 capsule (25 mg total) by mouth 3 (three) times  daily.  . Melatonin 10 MG TABS Take 10 mg by mouth at bedtime as needed (sleep).  . Multiple Vitamin (MULTIVITAMIN) tablet Take 1 tablet by mouth daily.  . Vitamin D, Ergocalciferol, (DRISDOL) 1.25 MG (50000 UNIT) CAPS capsule Take 1 capsule (50,000 Units total) by mouth every 7 (seven) days.  . [DISCONTINUED] buPROPion (WELLBUTRIN XL) 300 MG 24 hr tablet Take 1 tablet (300 mg total) by mouth daily.  . [DISCONTINUED] hydrOXYzine (VISTARIL) 25 MG capsule Take 25 mg by mouth 3 (three) times daily.    ROS:  Review of Systems  Constitutional: Positive for weight loss.  HENT: Negative.   Eyes: Negative.   Respiratory: Negative.   Cardiovascular: Negative.   Gastrointestinal: Negative.   Genitourinary: Negative.   Musculoskeletal: Negative.   Skin: Negative.   Neurological: Negative.   Endo/Heme/Allergies: Negative.   Psychiatric/Behavioral: Positive for depression. The patient is nervous/anxious.   All other systems reviewed and are negative.    Objective:   Today's Vitals: BP 117/79 (BP Location: Right Arm, Patient Position: Sitting, Cuff Size: Normal)   Pulse 78   Temp (!) 97.1 F (36.2 C) (Temporal)   Resp 16   Ht 5' (1.524 m)   Wt 174 lb (78.9 kg)   SpO2 98%   BMI 33.98 kg/m  Vitals with BMI 11/07/2019  09/15/2019 08/29/2019  Height 5\' 0"  5\' 0"  5\' 0"   Weight 174 lbs 194 lbs 194 lbs 13 oz  BMI 33.98 37.89 38.04  Systolic 117 104  Diastolic 79 74 74  Pulse 78 - 95     Physical Exam Vitals and nursing note reviewed.  Constitutional:      Appearance: Normal appearance. She is well-developed and well-groomed. She is obese.  HENT:     Head: Normocephalic and atraumatic.     Right Ear: External ear normal.     Left Ear: External ear normal.     Mouth/Throat:     Comments: Mask in place  Eyes:     General:        Right eye: No discharge.        Left eye: No discharge.     Conjunctiva/sclera: Conjunctivae normal.  Cardiovascular:     Rate and Rhythm: Normal rate  and regular rhythm.     Pulses: Normal pulses.     Heart sounds: Normal heart sounds.  Pulmonary:     Effort: Pulmonary effort is normal.     Breath sounds: Normal breath sounds.  Musculoskeletal:        General: Normal range of motion.     Cervical back: Normal range of motion and neck supple.  Skin:    General: Skin is warm.  Neurological:     General: No focal deficit present.     Mental Status: She is alert and oriented to person, place, and time.  Psychiatric:        Attention and Perception: Attention normal.        Mood and Affect: Mood normal.        Speech: Speech normal.        Behavior: Behavior normal. Behavior is cooperative.        Thought Content: Thought content normal.        Cognition and Memory: Cognition normal.        Judgment: Judgment normal.      Depression screen [facility] 2/9 11/07/2019 09/15/2019 08/29/2019 07/07/2019 06/07/2019  Decreased Interest 2 2 3 2  0  Down, Depressed, Hopeless 2 3 3 3 3   PHQ - 2 Score 4 5 6 5 3   Altered sleeping 3 3 3  0 0  Tired, decreased energy 3 2 3 3  0  Change in appetite 3 3 2 3 2   Feeling bad or failure about yourself  2 3 3 3 3   Trouble concentrating 1 2 3 3 3   Moving slowly or fidgety/restless 2 3 2 2  0  Suicidal thoughts 0 0 1 0 0  PHQ-9 Score 18 21 23 19 11   Difficult doing work/chores Somewhat difficult Extremely dIfficult Very difficult Very difficult Very difficult  Some recent data might be hidden    GAD 7 : Generalized Anxiety Score 11/07/2019 09/15/2019 08/29/2019 07/07/2019  Nervous, Anxious, on Edge 3 3 3 3   Control/stop worrying 3 3 3 2   Worry too much - different things 3 3 3 1   Trouble relaxing 3 3 3 2   Restless 0 2 2 2   Easily annoyed or irritable 0 0 0 2  Afraid - awful might happen 1 3 3  0  Total GAD 7 Score 13 17 17 12   Anxiety Difficulty Somewhat difficult Extremely difficult Very difficult Very difficult        Assessment   1. Depression, major, single episode, moderate (HCC)   2. Anxiety      Tests ordered No orders  of the defined types were placed in this encounter.    Plan: Please see assessment and plan per problem list above.   Meds ordered this encounter  Medications  . hydrOXYzine (VISTARIL) 25 MG capsule    Sig: Take 1 capsule (25 mg total) by mouth 3 (three) times daily.    Dispense:  90 capsule    Refill:  2    Order Specific Question:   Supervising Provider    Answer:   SIMPSON, MARGARET E [[id number]]  . buPROPion (WELLBUTRIN XL) 300 MG 24 hr tablet    Sig: Take 1 tablet (300 mg total) by mouth daily.    Dispense:  90 tablet    Refill:  0    Order Specific Question:   Supervising Provider    Answer:   Kerri Perches [[id number]]    Patient to follow-up in 8 weeks   Freddy Finner, NP

## 2019-11-10 ENCOUNTER — Ambulatory Visit: Payer: BC Managed Care – PPO | Admitting: Family Medicine

## 2019-11-15 ENCOUNTER — Other Ambulatory Visit: Payer: Self-pay

## 2019-11-15 DIAGNOSIS — F321 Major depressive disorder, single episode, moderate: Secondary | ICD-10-CM

## 2019-11-15 MED ORDER — ARIPIPRAZOLE 5 MG PO TABS: 5.0000 mg | ORAL_TABLET | Freq: Every day | ORAL | 0 refills | Status: DC

## 2019-11-28 ENCOUNTER — Ambulatory Visit (HOSPITAL_COMMUNITY)
Admission: RE | Admit: 2019-11-28 | Discharge: 2019-11-28 | Disposition: A | Discharge status: Home / Self Care | Payer: BC Managed Care – PPO | Source: Ambulatory Visit | Attending: Family Medicine | Admitting: Family Medicine

## 2019-11-28 ENCOUNTER — Other Ambulatory Visit: Payer: Self-pay

## 2019-11-28 DIAGNOSIS — N6012 Diffuse cystic mastopathy of left breast: Secondary | ICD-10-CM | POA: Diagnosis not present

## 2019-11-28 DIAGNOSIS — R928 Other abnormal and inconclusive findings on diagnostic imaging of breast: Secondary | ICD-10-CM

## 2019-11-28 DIAGNOSIS — N6011 Diffuse cystic mastopathy of right breast: Secondary | ICD-10-CM | POA: Diagnosis not present

## 2019-11-28 DIAGNOSIS — R922 Inconclusive mammogram: Secondary | ICD-10-CM | POA: Diagnosis not present

## 2019-12-03 ENCOUNTER — Encounter: Payer: Self-pay | Admitting: Family Medicine

## 2019-12-07 ENCOUNTER — Ambulatory Visit (INDEPENDENT_AMBULATORY_CARE_PROVIDER_SITE_OTHER): Payer: BC Managed Care – PPO | Admitting: Psychology

## 2019-12-07 DIAGNOSIS — F411 Generalized anxiety disorder: Secondary | ICD-10-CM

## 2019-12-18 ENCOUNTER — Ambulatory Visit (INDEPENDENT_AMBULATORY_CARE_PROVIDER_SITE_OTHER): Payer: BC Managed Care – PPO | Admitting: Psychology

## 2019-12-18 DIAGNOSIS — F331 Major depressive disorder, recurrent, moderate: Secondary | ICD-10-CM | POA: Diagnosis not present

## 2019-12-27 ENCOUNTER — Other Ambulatory Visit: Payer: Self-pay | Admitting: Family Medicine

## 2019-12-27 DIAGNOSIS — F321 Major depressive disorder, single episode, moderate: Secondary | ICD-10-CM

## 2019-12-27 DIAGNOSIS — F419 Anxiety disorder, unspecified: Secondary | ICD-10-CM

## 2019-12-29 MED ORDER — BUSPIRONE HCL 10 MG PO TABS: 10.0000 mg | ORAL_TABLET | Freq: Three times a day (TID) | ORAL | 0 refills | Status: DC

## 2019-12-29 MED ORDER — ARIPIPRAZOLE 5 MG PO TABS: 5.0000 mg | ORAL_TABLET | Freq: Every day | ORAL | 0 refills | Status: DC

## 2020-01-01 ENCOUNTER — Ambulatory Visit (INDEPENDENT_AMBULATORY_CARE_PROVIDER_SITE_OTHER): Payer: BC Managed Care – PPO | Admitting: Psychology

## 2020-01-01 DIAGNOSIS — F331 Major depressive disorder, recurrent, moderate: Secondary | ICD-10-CM

## 2020-01-04 ENCOUNTER — Ambulatory Visit: Payer: BC Managed Care – PPO | Admitting: Family Medicine

## 2020-01-12 ENCOUNTER — Telehealth: Payer: BC Managed Care – PPO | Admitting: Family Medicine

## 2020-01-19 ENCOUNTER — Ambulatory Visit: Payer: BC Managed Care – PPO | Admitting: Psychology

## 2020-02-01 ENCOUNTER — Ambulatory Visit: Payer: BC Managed Care – PPO | Admitting: Psychology

## 2020-02-05 ENCOUNTER — Other Ambulatory Visit: Payer: Self-pay | Admitting: Family Medicine

## 2020-02-05 DIAGNOSIS — F321 Major depressive disorder, single episode, moderate: Secondary | ICD-10-CM

## 2020-02-05 MED ORDER — ARIPIPRAZOLE 5 MG PO TABS: 5.0000 mg | ORAL_TABLET | Freq: Every day | ORAL | 0 refills | Status: DC

## 2020-02-09 ENCOUNTER — Encounter: Payer: Self-pay | Admitting: Family Medicine

## 2020-02-09 ENCOUNTER — Other Ambulatory Visit: Payer: Self-pay

## 2020-02-09 ENCOUNTER — Telehealth: Payer: BC Managed Care – PPO | Admitting: Family Medicine

## 2020-02-09 VITALS — BP 117/79 | HR 78 | Temp 97.1°F | Ht 66.0 in | Wt 162.0 lb

## 2020-02-09 DIAGNOSIS — F419 Anxiety disorder, unspecified: Secondary | ICD-10-CM

## 2020-02-09 DIAGNOSIS — F321 Major depressive disorder, single episode, moderate: Secondary | ICD-10-CM

## 2020-02-09 NOTE — Assessment & Plan Note (Signed)
Much improved, continue medications until psychiatry can take over. Denies SI and HI. New referral made today

## 2020-02-09 NOTE — Patient Instructions (Addendum)
I appreciate the opportunity to provide you with care for your health and wellness. Today we discussed: mood  Follow up: 3 months   No labs   Referrals today: psychiatry   So glad you are feeling better and things are working out.  Please continue your medications and we will continue to work on getting you a psychiatry provider.  Remember to continue to take care of you.  Please continue to practice social distancing to keep you, your family, and our community safe.  If you must go out, please wear a mask and practice good handwashing.  It was a pleasure to see you and I look forward to continuing to work together on your health and well-being. Please do not hesitate to call the office if you need care or have questions about your care.  Have a wonderful day and week. With Gratitude, Tereasa Coop, DNP, AGNP-BC

## 2020-02-09 NOTE — Progress Notes (Signed)
Virtual Visit via Telephone Note   This visit type was conducted due to national recommendations for restrictions regarding the COVID-19 Pandemic (e.g. social distancing) in an effort to limit this patient's exposure and mitigate transmission in our community.  Due to her co-morbid illnesses, this patient is at least at moderate risk for complications without adequate follow up.  This format is felt to be most appropriate for this patient at this time.  The patient did not have access to video technology/had technical difficulties with video requiring transitioning to audio format only (telephone).  All issues noted in this document were discussed and addressed.  No physical exam could be performed with this format.    Evaluation Performed:  Follow-up visit  Date:  02/09/2020   ID:  Patricia Becker, DOB Mar 01, 1976, MRN [id number]  Patient Location: Home Provider Location: Office/Clinic  Location of Patient: Home Location of Provider: Telehealth Consent was obtain for visit to be over via telehealth. I verified that I am speaking with the correct person using two identifiers.  PCP:  Freddy Finner, NP   Chief Complaint:  Mood   History of Present Illness:    Patricia Becker is a 44 y.o. female with history as listed below. Unfortunately the psychiatry referral was denied due to the provider not taking new patients. We will place a new one today. Overall she reports feeling much better secondary to husband coming back home and them working things out. Denies SI and HI. GAD and PHQ have improved. She is still taking her medications, and wants to continue until psychiatry assesses what her on going needs might be. She has no other concerns to discuss today.   The patient does not have symptoms concerning for COVID-19 infection (fever, chills, cough, or new shortness of breath).   Past Medical, Surgical, Social History, Allergies, and Medications have been Reviewed.  Past Medical  History:  Diagnosis Date  . Anxiety   . Anxiety   . Depression   . Depression   . Encounter for screening for HIV 07/08/2017  . Polypharmacy 07/08/2017   Past Surgical History:  Procedure Laterality Date  . CESAREAN SECTION    . COLONOSCOPY     2017, normal.   . KNEE SURGERY  1981  . TUBAL LIGATION       Current Meds  Medication Sig  . ARIPiprazole (ABILIFY) 5 MG tablet Take 1 tablet (5 mg total) by mouth daily.  Marland Kitchen buPROPion (WELLBUTRIN XL) 300 MG 24 hr tablet Take 1 tablet (300 mg total) by mouth daily.  . busPIRone (BUSPAR) 10 MG tablet Take 1 tablet (10 mg total) by mouth 3 (three) times daily.  . hydrOXYzine (VISTARIL) 25 MG capsule Take 1 capsule (25 mg total) by mouth 3 (three) times daily.  . Melatonin 10 MG TABS Take 10 mg by mouth at bedtime as needed (sleep).  . Multiple Vitamin (MULTIVITAMIN) tablet Take 1 tablet by mouth daily.  . Vitamin D, Ergocalciferol, (DRISDOL) 1.25 MG (50000 UNIT) CAPS capsule Take 1 capsule (50,000 Units total) by mouth every 7 (seven) days.     Allergies:   Demerol [meperidine hcl], Meperidine, and Aspirin   ROS:   Please see the history of present illness.    All other systems reviewed and are negative.   Labs/Other Tests and Data Reviewed:    Recent Labs: 04/13/2019: ALT 22; BUN 16; Creatinine, Ser 0.79; Hemoglobin 12.5; Platelets 234; Potassium 3.9; Sodium 140 04/18/2019: TSH 1.97   Recent Lipid Panel  Lab Results  Component Value Date/Time   CHOL 136 07/08/2017 10:10 AM   TRIG 79 07/08/2017 10:10 AM   HDL 52 07/08/2017 10:10 AM   CHOLHDL 2.6 07/08/2017 10:10 AM   LDLCALC 68 07/08/2017 10:10 AM    Wt Readings from Last 3 Encounters:  02/09/20 162 lb (73.5 kg)  11/07/19 174 lb (78.9 kg)  09/15/19 194 lb (88 kg)     Objective:    Vital Signs:  BP 117/79   Pulse 78   Temp (!) 97.1 F (36.2 C)   Ht 5\' 6"  (1.676 m)   Wt 162 lb (73.5 kg)   SpO2 98%   BMI 26.15 kg/m    VITAL SIGNS:  reviewed GEN:  no acute  distress RESPIRATORY:  no shortness of breath in conversation PSYCH:  normal affect and mood   Depression screen [facility] 2/9 02/09/2020 11/07/2019 09/15/2019 08/29/2019 07/07/2019  Decreased Interest 0 2 2 3 2   Down, Depressed, Hopeless 0 2 3 3 3   PHQ - 2 Score 0 4 5 6 5   Altered sleeping - 3 3 3  0  Tired, decreased energy - 3 2 3 3   Change in appetite - 3 3 2 3   Feeling bad or failure about yourself  - 2 3 3 3   Trouble concentrating - 1 2 3 3   Moving slowly or fidgety/restless - 2 3 2 2   Suicidal thoughts - 0 0 1 0  PHQ-9 Score - 18 21 23 19   Difficult doing work/chores - Somewhat difficult Extremely dIfficult Very difficult Very difficult  Some recent data might be hidden   GAD 7 : Generalized Anxiety Score 02/09/2020 11/07/2019 09/15/2019 08/29/2019  Nervous, Anxious, on Edge 0 3 3 3   Control/stop worrying 0 3 3 3   Worry too much - different things 0 3 3 3   Trouble relaxing 0 3 3 3   Restless 0 0 2 2  Easily annoyed or irritable 0 0 0 0  Afraid - awful might happen 0 1 3 3   Total GAD 7 Score 0 13 17 17   Anxiety Difficulty Not difficult at all Somewhat difficult Extremely difficult Very difficult    ASSESSMENT & PLAN:    1. Depression, major, single episode, moderate (HCC)   2. Anxiety  Time:   Today, I have spent 7 minutes with the patient with telehealth technology discussing the above problems.     Medication Adjustments/Labs and Tests Ordered: Current medicines are reviewed at length with the patient today.  Concerns regarding medicines are outlined above.   Tests Ordered: Orders Placed This Encounter  Procedures  . Ambulatory referral to Behavioral Health    Medication Changes: No orders of the defined types were placed in this encounter.   Disposition:  Follow up 3 months  Signed, , NP  02/09/2020 11:42 AM     Primary Care [facility]

## 2020-02-09 NOTE — Assessment & Plan Note (Signed)
Much improved, Denies SI and HI. Continue medications New referral to psychiatry for medication adjustment and weans

## 2020-02-15 ENCOUNTER — Ambulatory Visit (INDEPENDENT_AMBULATORY_CARE_PROVIDER_SITE_OTHER): Payer: BC Managed Care – PPO | Admitting: Psychology

## 2020-02-15 DIAGNOSIS — F331 Major depressive disorder, recurrent, moderate: Secondary | ICD-10-CM

## 2020-02-25 ENCOUNTER — Other Ambulatory Visit: Payer: Self-pay | Admitting: Family Medicine

## 2020-02-25 DIAGNOSIS — F321 Major depressive disorder, single episode, moderate: Secondary | ICD-10-CM

## 2020-02-25 DIAGNOSIS — F419 Anxiety disorder, unspecified: Secondary | ICD-10-CM

## 2020-02-26 MED ORDER — BUPROPION HCL ER (XL) 300 MG PO TB24: 300.0000 mg | ORAL_TABLET | Freq: Every day | ORAL | 0 refills | Status: DC

## 2020-03-11 ENCOUNTER — Other Ambulatory Visit: Payer: Self-pay | Admitting: Family Medicine

## 2020-03-11 DIAGNOSIS — F321 Major depressive disorder, single episode, moderate: Secondary | ICD-10-CM

## 2020-03-11 DIAGNOSIS — F419 Anxiety disorder, unspecified: Secondary | ICD-10-CM

## 2020-03-11 MED ORDER — BUSPIRONE HCL 10 MG PO TABS: 10.0000 mg | ORAL_TABLET | Freq: Three times a day (TID) | ORAL | 3 refills | Status: DC

## 2020-03-11 MED ORDER — ARIPIPRAZOLE 5 MG PO TABS: 5.0000 mg | ORAL_TABLET | Freq: Every day | ORAL | 3 refills | Status: DC

## 2020-06-09 ENCOUNTER — Other Ambulatory Visit: Payer: Self-pay | Admitting: Family Medicine

## 2020-06-09 DIAGNOSIS — F321 Major depressive disorder, single episode, moderate: Secondary | ICD-10-CM

## 2020-06-09 DIAGNOSIS — F419 Anxiety disorder, unspecified: Secondary | ICD-10-CM

## 2020-07-30 ENCOUNTER — Encounter: Payer: Self-pay | Admitting: Family Medicine

## 2020-08-01 ENCOUNTER — Other Ambulatory Visit: Payer: Self-pay

## 2020-08-01 ENCOUNTER — Ambulatory Visit (INDEPENDENT_AMBULATORY_CARE_PROVIDER_SITE_OTHER): Payer: BC Managed Care – PPO | Admitting: Nurse Practitioner

## 2020-08-01 ENCOUNTER — Encounter: Payer: Self-pay | Admitting: Nurse Practitioner

## 2020-08-01 DIAGNOSIS — H0011 Chalazion right upper eyelid: Secondary | ICD-10-CM | POA: Diagnosis not present

## 2020-08-01 DIAGNOSIS — H0019 Chalazion unspecified eye, unspecified eyelid: Secondary | ICD-10-CM | POA: Insufficient documentation

## 2020-08-01 MED ORDER — BACITRACIN-POLYMYXIN B 500-10000 UNIT/GM OP OINT: 1.0000 "application " | TOPICAL_OINTMENT | Freq: Two times a day (BID) | OPHTHALMIC | 0 refills | Status: DC

## 2020-08-01 NOTE — Patient Instructions (Addendum)
Chalazion  A chalazion is a swelling or lump on the eyelid. It can affect the upper eyelid or the lower eyelid. What are the causes? This condition may be caused by:  Long-lasting (chronic) inflammation of the eyelid glands.  A blocked oil gland in the eyelid. What are the signs or symptoms? Symptoms of this condition include:  Swelling of the eyelid. The swelling may spread to areas around the eye.  A hard lump on the eyelid.  Blurry vision. The lump on the eyelid may make it hard to see out of the eye. How is this diagnosed? This condition is diagnosed with an examination of the eye. How is this treated? This condition is treated by applying a warm compress to the eyelid. If the condition does not improve, it may be treated with:  Medicine that is injected into the chalazion by a health care provider.  Surgery.  Medicine that is applied to the eye. Follow these instructions at home: Managing pain and swelling  Apply a warm, moist compress to the eyelid 4-6 times a day for 10-15 minutes at a time. This will help to open any blocked glands and to reduce redness and swelling.  Apply over-the-counter and prescription medicines only as told by your health care provider. General instructions  Do not touch the chalazion.  Do not try to remove the pus. Do not squeeze the chalazion or stick it with a pin or needle.  Do not rub your eyes.  Wash your hands often. Dry your hands with a clean towel.  Keep your face, scalp, and eyebrows clean.  Avoid wearing eye makeup.  If the chalazion does not break open (rupture) on its own, return to your health care provider.  Keep all follow-up appointments as told by your health care provider. This is important. Contact a health care provider if:  Your eyelid has not improved in 4 weeks.  Your eyelid is getting worse.  You have a fever.  The chalazion does not rupture on its own after a month of home treatment. Get help right  away if:  You have pain in your eye.  Your vision changes.  The chalazion becomes painful or red.  The chalazion gets bigger. Summary  A chalazion is a swelling or lump on the upper or lower eyelid.  It may be caused by chronic inflammation or a blocked oil gland.  Apply a warm, moist compress to the eyelid 4-6 times a day for 10-15 minutes at a time.  Keep your face, scalp, and eyebrows clean. This information is not intended to replace advice given to you by your health care provider. Make sure you discuss any questions you have with your health care provider. Document Revised: 10/14/2017 Document Reviewed: 10/14/2017 Elsevier Patient Education  2021 Elsevier Inc.  

## 2020-08-01 NOTE — Progress Notes (Signed)
Acute Office Visit  Subjective:    Patient ID: Patricia Becker, female    DOB: [birth year], 45 y.o.   MRN: [id number]  Chief Complaint  Patient presents with  . Stye    R eye X 1 month; has tried warm moist compresses without relief.     HPI Patient is in today for right eyelid swelling. Her right upper eyelid has a red lesion that has been present for 1 month. No associated pain, but she states it is annoying. She has tried warm compresses, but that has not helped.   Past Medical History:  Diagnosis Date  . Anxiety   . Anxiety   . Depression   . Depression   . Encounter for screening for HIV 07/08/2017  . Polypharmacy 07/08/2017    Past Surgical History:  Procedure Laterality Date  . CESAREAN SECTION    . COLONOSCOPY     2017, normal.   . KNEE SURGERY  1981  . TUBAL LIGATION      Family History  Problem Relation Age of Onset  . Colon polyps Mother   . Suicidality Father   . Hypertension Sister   . Colon cancer Maternal Grandmother   . Cancer Maternal Grandmother   . Drug abuse Other     Social History   Socioeconomic History  . Marital status: Married    Spouse name: Dwayne  . Number of children: 3  . Years of education: Not on file  . Highest education level: Bachelor's degree (e.g., BA, AB, BS)  Occupational History  . Not on file  Tobacco Use  . Smoking status: Passive Smoke Exposure - Never Smoker  . Smokeless tobacco: Never Used  Vaping Use  . Vaping Use: Never used  Substance and Sexual Activity  . Alcohol use: Yes    Alcohol/week: 2.0 standard drinks    Types: 2 Glasses of wine per week    Comment: occassionally   . Drug use: No  . Sexual activity: Yes    Birth control/protection: Surgical  Other Topics Concern  . Not on file  Social History Narrative   Moved from [place], [place] in 01/2017. Had lived there for over 20 years. Moved with husband, son, and daughter.       Daughter is 44 y/o-married, expecting and lives in [place]   Daughter  is 76 y/o married, has 2 children: 3 and 1 y/o living with her right now   Son is 75 y/o.      Daugther is 91 y/o- husband daughter-step daughter. Lives with her mother now.      Enjoys walking trials, and working out when she could, netflix and chill, and family      Diet: reports she use to meal prep, but has stopped, is supplementing with green powders, meats, fruits   Caffeine: 20 oz 3-4 time a week, no soda, tea- at times mixed sweet and unsweet   Water: 5-8 cups daily      Wears seatbelt.    Wears sunscreen.   Smoke detectors at home.   Does not use phone while driving.   Social Determinants of Health   Financial Resource Strain: Not on file  Food Insecurity: Not on file  Transportation Needs: Not on file  Physical Activity: Not on file  Stress: Not on file  Social Connections: Not on file  Intimate Partner Violence: Not on file    Outpatient Medications Prior to Visit  Medication Sig Dispense Refill  . ARIPiprazole (ABILIFY) 5 MG tablet  Take 1 tablet (5 mg total) by mouth daily. 30 tablet 3  . buPROPion (WELLBUTRIN XL) 300 MG 24 hr tablet TAKE 1 TABLET(300 MG) BY MOUTH DAILY 90 tablet 0  . busPIRone (BUSPAR) 10 MG tablet Take 1 tablet (10 mg total) by mouth 3 (three) times daily. 90 tablet 3  . hydrOXYzine (VISTARIL) 25 MG capsule Take 1 capsule (25 mg total) by mouth 3 (three) times daily. 90 capsule 2  . Melatonin 10 MG TABS Take 10 mg by mouth at bedtime as needed (sleep).    . Multiple Vitamin (MULTIVITAMIN) tablet Take 1 tablet by mouth daily.    . Vitamin D, Ergocalciferol, (DRISDOL) 1.25 MG (50000 UNIT) CAPS capsule Take 1 capsule (50,000 Units total) by mouth every 7 (seven) days. 12 capsule 1   No facility-administered medications prior to visit.    Allergies  Allergen Reactions  . Demerol [Meperidine Hcl] Anaphylaxis  . Meperidine Anaphylaxis  . Aspirin Other (See Comments)    Upset stomach    Review of Systems  Constitutional: Negative.   Eyes:  Negative for discharge and visual disturbance.       Right upper eyelid lesion       Objective:    Physical Exam Constitutional:      Appearance: Normal appearance.  Eyes:     General:        Right eye: No discharge.        Left eye: No discharge.     Extraocular Movements: Extraocular movements intact.     Conjunctiva/sclera: Conjunctivae normal.     Pupils: Pupils are equal, round, and reactive to light.     Comments: Lesion to right upper eyelid, outer surface; size of pencil eraser  Neurological:     Mental Status: She is alert.     BP 115/71   Pulse 82   Temp 98.3 F (36.8 C)   Resp 20   Ht 5' (1.524 m)   Wt 196 lb (88.9 kg)   SpO2 97%   BMI 38.28 kg/m  Wt Readings from Last 3 Encounters:  08/01/20 196 lb (88.9 kg)  02/09/20 162 lb (73.5 kg)  11/07/19 174 lb (78.9 kg)    Health Maintenance Due  Topic Date Due  . COVID-19 Vaccine (3 - Booster for Moderna series) 02/16/2020    There are no preventive care reminders to display for this patient.   Lab Results  Component Value Date   TSH 1.97 04/18/2019   Lab Results  Component Value Date   WBC 8.7 04/13/2019   HGB 12.5 04/13/2019   HCT 39.7 04/13/2019   MCV 93.0 04/13/2019   PLT 234 04/13/2019   Lab Results  Component Value Date   NA 140 04/13/2019   K 3.9 04/13/2019   CO2 23 04/13/2019   GLUCOSE 86 04/13/2019   BUN 16 04/13/2019   CREATININE 0.79 04/13/2019   BILITOT 0.4 04/13/2019   ALKPHOS 69 04/13/2019   AST 13 (L) 04/13/2019   ALT 22 04/13/2019   PROT 6.3 (L) 04/13/2019   ALBUMIN 3.6 04/13/2019   CALCIUM 8.4 (L) 04/13/2019   ANIONGAP 8 04/13/2019   Lab Results  Component Value Date   CHOL 136 07/08/2017   Lab Results  Component Value Date   HDL 52 07/08/2017   Lab Results  Component Value Date   LDLCALC 68 07/08/2017   Lab Results  Component Value Date   TRIG 79 07/08/2017   Lab Results  Component Value Date   CHOLHDL 2.6  07/08/2017   No results found for:  HGBA1C     Assessment & Plan:   Problem List Items Addressed This Visit      Musculoskeletal and Integument   Chalazion    -started a month ago, but warm compressed haven't helped -Rx. Bacitracin-polymyxin B -if no improvement, would consider ophthalmology appointment          Meds ordered this encounter  Medications  . bacitracin-polymyxin b (POLYSPORIN) ophthalmic ointment    Sig: Place 1 application into the right eye every 12 (twelve) hours. apply to eye every 12 hours while awake for a week    Dispense:  3.5 g    Refill:  0     Heather Roberts, NP

## 2020-08-01 NOTE — Assessment & Plan Note (Signed)
-  started a month ago, but warm compressed haven't helped -Rx. Bacitracin-polymyxin B -if no improvement, would consider ophthalmology appointment

## 2020-08-07 ENCOUNTER — Other Ambulatory Visit: Payer: Self-pay | Admitting: Family Medicine

## 2020-08-07 DIAGNOSIS — F321 Major depressive disorder, single episode, moderate: Secondary | ICD-10-CM

## 2020-08-22 ENCOUNTER — Other Ambulatory Visit: Payer: Self-pay | Admitting: Nurse Practitioner

## 2020-08-22 ENCOUNTER — Encounter: Payer: Self-pay | Admitting: Nurse Practitioner

## 2020-08-22 DIAGNOSIS — H0011 Chalazion right upper eyelid: Secondary | ICD-10-CM

## 2020-08-27 DIAGNOSIS — H00021 Hordeolum internum right upper eyelid: Secondary | ICD-10-CM | POA: Diagnosis not present

## 2020-08-29 DIAGNOSIS — H00021 Hordeolum internum right upper eyelid: Secondary | ICD-10-CM | POA: Diagnosis not present

## 2020-09-03 ENCOUNTER — Other Ambulatory Visit: Payer: Self-pay | Admitting: Family Medicine

## 2020-09-03 DIAGNOSIS — F321 Major depressive disorder, single episode, moderate: Secondary | ICD-10-CM

## 2020-09-03 DIAGNOSIS — F419 Anxiety disorder, unspecified: Secondary | ICD-10-CM

## 2020-12-04 ENCOUNTER — Encounter: Payer: Self-pay | Admitting: Nurse Practitioner

## 2020-12-04 NOTE — Telephone Encounter (Signed)
Called patient left voicemail to call office to schedule an appointment 

## 2020-12-25 ENCOUNTER — Encounter: Payer: Self-pay | Admitting: Nurse Practitioner

## 2020-12-25 ENCOUNTER — Other Ambulatory Visit: Payer: Self-pay

## 2020-12-25 ENCOUNTER — Telehealth (INDEPENDENT_AMBULATORY_CARE_PROVIDER_SITE_OTHER): Payer: Managed Care, Other (non HMO) | Admitting: Nurse Practitioner

## 2020-12-25 DIAGNOSIS — F4310 Post-traumatic stress disorder, unspecified: Secondary | ICD-10-CM

## 2020-12-25 DIAGNOSIS — F419 Anxiety disorder, unspecified: Secondary | ICD-10-CM | POA: Diagnosis not present
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
## 2021-01-05 ENCOUNTER — Other Ambulatory Visit: Payer: Self-pay | Admitting: Family Medicine

## 2021-01-05 DIAGNOSIS — F321 Major depressive disorder, single episode, moderate: Secondary | ICD-10-CM

## 2021-01-05 DIAGNOSIS — F419 Anxiety disorder, unspecified: Secondary | ICD-10-CM

## 2021-01-08 ENCOUNTER — Telehealth (INDEPENDENT_AMBULATORY_CARE_PROVIDER_SITE_OTHER): Payer: Managed Care, Other (non HMO) | Admitting: Licensed Clinical Social Worker

## 2021-01-08 ENCOUNTER — Other Ambulatory Visit: Payer: Self-pay

## 2021-01-08 DIAGNOSIS — F419 Anxiety disorder, unspecified: Secondary | ICD-10-CM

## 2021-01-21 ENCOUNTER — Ambulatory Visit (INDEPENDENT_AMBULATORY_CARE_PROVIDER_SITE_OTHER): Payer: Managed Care, Other (non HMO) | Admitting: Licensed Clinical Social Worker

## 2021-01-21 ENCOUNTER — Other Ambulatory Visit: Payer: Self-pay

## 2021-01-21 DIAGNOSIS — F4310 Post-traumatic stress disorder, unspecified: Secondary | ICD-10-CM

## 2021-01-21 DIAGNOSIS — F419 Anxiety disorder, unspecified: Secondary | ICD-10-CM

## 2021-01-21 DIAGNOSIS — F339 Major depressive disorder, recurrent, unspecified: Secondary | ICD-10-CM

## 2021-01-22 ENCOUNTER — Telehealth: Payer: Self-pay | Admitting: Licensed Clinical Social Worker

## 2021-01-22 DIAGNOSIS — F419 Anxiety disorder, unspecified: Secondary | ICD-10-CM

## 2021-01-22 DIAGNOSIS — F339 Major depressive disorder, recurrent, unspecified: Secondary | ICD-10-CM

## 2021-01-22 DIAGNOSIS — F4310 Post-traumatic stress disorder, unspecified: Secondary | ICD-10-CM
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
## 2021-01-22 NOTE — BH Specialist Note (Signed)
[facility] Treatment Plan Team Note  MRN: [id number] NAME: Patricia Becker  DATE: 01/22/21  Start time: 3p  End time:  305p Total time:  5 min  Total number of Virtual BH Treatment Team Plan encounters: 1/4  Treatment Team Attendees: Nolon Rod, LCSW & Dr. Vanetta Shawl, Psychiatrist  Diagnoses: No diagnosis found.  Goals, Interventions and Follow-up Plan Goals: Increase healthy adjustment to current life circumstances Interventions: Solution-Focused Strategies Mindfulness or Relaxation Training Medication Management Recommendations: consider adding SSRI or SNRI to target anxiety Follow-up Plan: biweekly VBH sessions  History of the present illness Presenting Problem/Current Symptoms: difficulty driving on highway  Psychiatric History  Depression: Yes Anxiety: Yes Mania: No Psychosis: No PTSD symptoms: No  Past Psychiatric History/Hospitalization(s): Hospitalization for psychiatric illness: No Prior Suicide Attempts: No Prior Self-injurious behavior: No  Psychosocial stressors Flowsheet Row Virtual BH Phone Follow Up from 09/30/2017 in [facility]  Current Stressors Other (Comment)  [Busy work & family schedule, difficulty practicing any techniques to help her relax]  Patient taking medications as prescribed Yes       Self-harm Behaviors Risk Assessment Flowsheet Row Virtual BH Phone Follow Up from 09/17/2017 in [facility]  Have you recently had any thoughts about harming yourself? No       Screenings PHQ-9 Assessments:  Depression screen [facility] 2/9 01/22/2021 12/25/2020 08/01/2020  Decreased Interest 2 0 0  Down, Depressed, Hopeless 2 0 1  PHQ - 2 Score 4 0 1  Altered sleeping 2 - -  Tired, decreased energy 1 - -  Change in appetite 1 - -  Feeling bad or failure about yourself  1 - -  Trouble concentrating 2 - -  Moving slowly or fidgety/restless - - -  Suicidal thoughts 0 - -  PHQ-9 Score 11 - -  Difficult doing  work/chores Somewhat difficult - -  Some recent data might be hidden   GAD-7 Assessments:  GAD 7 : Generalized Anxiety Score 01/22/2021 12/25/2020 02/09/2020 11/07/2019  Nervous, Anxious, on Edge 3 3 0 3  Control/stop worrying 3 0 0 3  Worry too much - different things 2 0 0 3  Trouble relaxing 2 3 0 3  Restless 1 0 0 0  Easily annoyed or irritable 2 0 0 0  Afraid - awful might happen 3 3 0 1  Total GAD 7 Score 16 9 0 13  Anxiety Difficulty Very difficult Not difficult at all Not difficult at all Somewhat difficult    Past Medical History Past Medical History:  Diagnosis Date   Anxiety    Anxiety    Depression    Depression    Encounter for screening for HIV 07/08/2017   Polypharmacy 07/08/2017    Vital signs: There were no vitals filed for this visit.  Allergies:  Allergies as of 01/22/2021 - Review Complete 12/25/2020  Allergen Reaction Noted   Demerol [meperidine hcl] Anaphylaxis 07/08/2017   Meperidine Anaphylaxis 05/30/2019   Aspirin Other (See Comments) 07/08/2017    Medication History Current medications:  Outpatient Encounter Medications as of 01/22/2021  Medication Sig   ARIPiprazole (ABILIFY) 5 MG tablet TAKE 1 TABLET(5 MG) BY MOUTH DAILY   buPROPion (WELLBUTRIN XL) 300 MG 24 hr tablet TAKE 1 TABLET(300 MG) BY MOUTH DAILY   busPIRone (BUSPAR) 10 MG tablet Take 1 tablet (10 mg total) by mouth 3 (three) times daily.   hydrOXYzine (VISTARIL) 25 MG capsule Take 1 capsule (25 mg total) by mouth 3 (three) times daily.   Melatonin  10 MG TABS Take 10 mg by mouth at bedtime as needed (sleep).   Multiple Vitamin (MULTIVITAMIN) tablet Take 1 tablet by mouth daily.   No facility-administered encounter medications on file as of 01/22/2021.     Scribe for Treatment Team: Marinda Elk, LCSW
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
## 2021-01-23 ENCOUNTER — Telehealth: Payer: Managed Care, Other (non HMO)

## 2021-01-26 ENCOUNTER — Encounter: Payer: Self-pay | Admitting: Nurse Practitioner

## 2021-01-27 ENCOUNTER — Other Ambulatory Visit: Payer: Self-pay

## 2021-01-27 DIAGNOSIS — F321 Major depressive disorder, single episode, moderate: Secondary | ICD-10-CM

## 2021-01-27 MED ORDER — ARIPIPRAZOLE 5 MG PO TABS: ORAL_TABLET | ORAL | 3 refills | Status: DC

## 2021-02-09 NOTE — Progress Notes (Signed)
No show

## 2021-03-03 ENCOUNTER — Other Ambulatory Visit: Payer: Self-pay

## 2021-03-03 DIAGNOSIS — F419 Anxiety disorder, unspecified: Secondary | ICD-10-CM

## 2021-03-03 MED ORDER — BUSPIRONE HCL 10 MG PO TABS: 10.0000 mg | ORAL_TABLET | Freq: Three times a day (TID) | ORAL | 0 refills | Status: DC

## 2021-03-06 ENCOUNTER — Telehealth: Payer: Managed Care, Other (non HMO)

## 2021-04-28 ENCOUNTER — Ambulatory Visit: Payer: Managed Care, Other (non HMO) | Admitting: Nurse Practitioner

## 2021-04-28 ENCOUNTER — Encounter: Payer: Self-pay | Admitting: Nurse Practitioner

## 2021-04-28 ENCOUNTER — Other Ambulatory Visit: Payer: Self-pay

## 2021-04-28 DIAGNOSIS — F321 Major depressive disorder, single episode, moderate: Secondary | ICD-10-CM | POA: Diagnosis not present

## 2021-04-28 DIAGNOSIS — J069 Acute upper respiratory infection, unspecified: Secondary | ICD-10-CM | POA: Diagnosis not present

## 2021-04-28 DIAGNOSIS — F419 Anxiety disorder, unspecified: Secondary | ICD-10-CM

## 2021-04-28 MED ORDER — ARIPIPRAZOLE 5 MG PO TABS: ORAL_TABLET | ORAL | 3 refills | Status: DC

## 2021-04-28 MED ORDER — BUSPIRONE HCL 10 MG PO TABS: 10.0000 mg | ORAL_TABLET | Freq: Three times a day (TID) | ORAL | 0 refills | Status: DC

## 2021-04-28 MED ORDER — AZITHROMYCIN 250 MG PO TABS: ORAL_TABLET | ORAL | 0 refills | Status: DC

## 2021-04-28 MED ORDER — BUPROPION HCL ER (XL) 300 MG PO TB24: ORAL_TABLET | ORAL | 0 refills | Status: DC

## 2021-04-28 MED ORDER — BENZONATATE 100 MG PO CAPS: 100.0000 mg | ORAL_CAPSULE | Freq: Two times a day (BID) | ORAL | 0 refills | Status: DC | PRN

## 2021-04-28 NOTE — Assessment & Plan Note (Signed)
-  Rx. z-pack and tessalon -no covid or flu test since symptoms ongoing x3 weeks; antivirals would be ineffective -would consider CXR if symptoms persist more than a week -pt will call if z-pack is ineffective

## 2021-04-28 NOTE — Progress Notes (Signed)
Acute Office Visit  Subjective:    Patient ID: Patricia Becker, female    DOB: 1976-03-05, 45 y.o.   MRN: [id number]  Chief Complaint  Patient presents with   Cough    Cough x3 weeks covid negative, discuss medication.    Cough Associated symptoms include rhinorrhea. Pertinent negatives include no chills, fever, sore throat, shortness of breath or wheezing.  Patient is in today for cough that started 3 weeks ago. She has tried taking dayquil and tylenol cough and cold. That is not helping.  She would like to f/u on anxiety/depression meds. She wants refills today.  Past Medical History:  Diagnosis Date   Anxiety    Anxiety    Depression    Depression    Encounter for screening for HIV 07/08/2017   Polypharmacy 07/08/2017    Past Surgical History:  Procedure Laterality Date   CESAREAN SECTION     COLONOSCOPY     2017, normal.    KNEE SURGERY  1981   TUBAL LIGATION      Family History  Problem Relation Age of Onset   Colon polyps Mother    Suicidality Father    Hypertension Sister    Colon cancer Maternal Grandmother    Cancer Maternal Grandmother    Drug abuse Other     Social History   Socioeconomic History   Marital status: Married    Spouse name: Dwayne   Number of children: 3   Years of education: Not on file   Highest education level: Bachelor's degree (e.g., BA, AB, BS)  Occupational History   Not on file  Tobacco Use   Smoking status: Never    Passive exposure: Yes   Smokeless tobacco: Never  Vaping Use   Vaping Use: Never used  Substance and Sexual Activity   Alcohol use: Yes    Alcohol/week: 2.0 standard drinks    Types: 2 Glasses of wine per week    Comment: occassionally    Drug use: No   Sexual activity: Yes    Birth control/protection: Surgical  Other Topics Concern   Not on file  Social History Narrative   Moved from [place], [place] in 01/2017. Had lived there for over 20 years. Moved with husband, son, and daughter.       Daughter  is 48 y/o-married, expecting and lives in [place]   Daughter is 60 y/o married, has 2 children: 3 and 1 y/o living with her right now   Son is 37 y/o.      Daugther is 25 y/o- husband daughter-step daughter. Lives with her mother now.      Enjoys walking trials, and working out when she could, netflix and chill, and family      Diet: reports she use to meal prep, but has stopped, is supplementing with green powders, meats, fruits   Caffeine: 20 oz 3-4 time a week, no soda, tea- at times mixed sweet and unsweet   Water: 5-8 cups daily      Wears seatbelt.    Wears sunscreen.   Smoke detectors at home.   Does not use phone while driving.   Social Determinants of Health   Financial Resource Strain: Not on file  Food Insecurity: Not on file  Transportation Needs: Not on file  Physical Activity: Not on file  Stress: Not on file  Social Connections: Not on file  Intimate Partner Violence: Not on file    Outpatient Medications Prior to Visit  Medication Sig Dispense Refill  hydrOXYzine (VISTARIL) 25 MG capsule Take 1 capsule (25 mg total) by mouth 3 (three) times daily. 90 capsule 2   Melatonin 10 MG TABS Take 10 mg by mouth at bedtime as needed (sleep).     Multiple Vitamin (MULTIVITAMIN) tablet Take 1 tablet by mouth daily.     ARIPiprazole (ABILIFY) 5 MG tablet TAKE 1 TABLET(5 MG) BY MOUTH DAILY 30 tablet 3   buPROPion (WELLBUTRIN XL) 300 MG 24 hr tablet TAKE 1 TABLET(300 MG) BY MOUTH DAILY 90 tablet 0   busPIRone (BUSPAR) 10 MG tablet Take 1 tablet (10 mg total) by mouth 3 (three) times daily. 30 tablet 0   No facility-administered medications prior to visit.    Allergies  Allergen Reactions   Demerol [Meperidine Hcl] Anaphylaxis   Meperidine Anaphylaxis   Aspirin Other (See Comments)    Upset stomach    Review of Systems  Constitutional:  Positive for fatigue. Negative for chills and fever.  HENT:  Positive for congestion, rhinorrhea and sinus pressure. Negative for  sinus pain and sore throat.   Respiratory:  Positive for cough. Negative for shortness of breath and wheezing.       Objective:    Physical Exam  There were no vitals taken for this visit. Wt Readings from Last 3 Encounters:  08/01/20 196 lb (88.9 kg)  02/09/20 162 lb (73.5 kg)  11/07/19 174 lb (78.9 kg)    Health Maintenance Due  Topic Date Due   TETANUS/TDAP  Never done   COVID-19 Vaccine (3 - Booster for Moderna series) 10/12/2019   COLONOSCOPY (Pts 45-86yrs Insurance coverage will need to be confirmed)  Never done   INFLUENZA VACCINE  12/09/2020    There are no preventive care reminders to display for this patient.   Lab Results  Component Value Date   TSH 1.97 04/18/2019   Lab Results  Component Value Date   WBC 8.7 04/13/2019   HGB 12.5 04/13/2019   HCT 39.7 04/13/2019   MCV 93.0 04/13/2019   PLT 234 04/13/2019   Lab Results  Component Value Date   NA 140 04/13/2019   K 3.9 04/13/2019   CO2 23 04/13/2019   GLUCOSE 86 04/13/2019   BUN 16 04/13/2019   CREATININE 0.79 04/13/2019   BILITOT 0.4 04/13/2019   ALKPHOS 69 04/13/2019   AST 13 (L) 04/13/2019   ALT 22 04/13/2019   PROT 6.3 (L) 04/13/2019   ALBUMIN 3.6 04/13/2019   CALCIUM 8.4 (L) 04/13/2019   ANIONGAP 8 04/13/2019   Lab Results  Component Value Date   CHOL 136 07/08/2017   Lab Results  Component Value Date   HDL 52 07/08/2017   Lab Results  Component Value Date   LDLCALC 68 07/08/2017   Lab Results  Component Value Date   TRIG 79 07/08/2017   Lab Results  Component Value Date   CHOLHDL 2.6 07/08/2017   No results found for: HGBA1C     Assessment & Plan:   Problem List Items Addressed This Visit       Respiratory   URI (upper respiratory infection) - Primary    -Rx. z-pack and tessalon -no covid or flu test since symptoms ongoing x3 weeks; antivirals would be ineffective -would consider CXR if symptoms persist more than a week -pt will call if z-pack is ineffective       Relevant Medications   azithromycin (ZITHROMAX) 250 MG tablet   benzonatate (TESSALON) 100 MG capsule     Other   Depression,  major, single episode, moderate (HCC)   Relevant Medications   ARIPiprazole (ABILIFY) 5 MG tablet   buPROPion (WELLBUTRIN XL) 300 MG 24 hr tablet   busPIRone (BUSPAR) 10 MG tablet   Anxiety   Relevant Medications   buPROPion (WELLBUTRIN XL) 300 MG 24 hr tablet   busPIRone (BUSPAR) 10 MG tablet     Meds ordered this encounter  Medications   azithromycin (ZITHROMAX) 250 MG tablet    Sig: Please dispense as a z-pack    Dispense:  6 tablet    Refill:  0   benzonatate (TESSALON) 100 MG capsule    Sig: Take 1 capsule (100 mg total) by mouth 2 (two) times daily as needed for cough.    Dispense:  20 capsule    Refill:  0   ARIPiprazole (ABILIFY) 5 MG tablet    Sig: TAKE 1 TABLET(5 MG) BY MOUTH DAILY    Dispense:  30 tablet    Refill:  3   buPROPion (WELLBUTRIN XL) 300 MG 24 hr tablet    Sig: TAKE 1 TABLET(300 MG) BY MOUTH DAILY    Dispense:  90 tablet    Refill:  0   busPIRone (BUSPAR) 10 MG tablet    Sig: Take 1 tablet (10 mg total) by mouth 3 (three) times daily.    Dispense:  30 tablet    Refill:  0   Date:  04/28/2021   Location of Patient: Home Location of Provider: Office Consent was obtain for visit to be over via telehealth. I verified that I am speaking with the correct person using two identifiers.  I connected with  Patricia Becker on 04/28/21 via telephone and verified that I am speaking with the correct person using two identifiers.   I discussed the limitations of evaluation and management by telemedicine. The patient expressed understanding and agreed to proceed.  Time spent:6 min   Heather Roberts, NP

## 2021-05-01 ENCOUNTER — Encounter: Payer: Self-pay | Admitting: Nurse Practitioner

## 2021-05-01 NOTE — Telephone Encounter (Signed)
Sure

## 2021-05-16 ENCOUNTER — Telehealth: Payer: Self-pay | Admitting: Nurse Practitioner

## 2021-05-16 NOTE — Telephone Encounter (Signed)
Patricia Becker

## 2021-07-14 ENCOUNTER — Other Ambulatory Visit: Payer: Self-pay

## 2021-07-14 ENCOUNTER — Ambulatory Visit: Payer: Managed Care, Other (non HMO) | Admitting: Family Medicine

## 2021-07-14 DIAGNOSIS — F419 Anxiety disorder, unspecified: Secondary | ICD-10-CM | POA: Insufficient documentation

## 2021-07-14 DIAGNOSIS — B351 Tinea unguium: Secondary | ICD-10-CM | POA: Diagnosis not present

## 2021-07-14 DIAGNOSIS — F32A Depression, unspecified: Secondary | ICD-10-CM | POA: Insufficient documentation

## 2021-07-14 DIAGNOSIS — E669 Obesity, unspecified: Secondary | ICD-10-CM | POA: Insufficient documentation

## 2021-07-14 MED ORDER — ARIPIPRAZOLE 5 MG PO TABS: ORAL_TABLET | ORAL | 1 refills | Status: DC

## 2021-07-14 MED ORDER — BUSPIRONE HCL 10 MG PO TABS: 10.0000 mg | ORAL_TABLET | Freq: Two times a day (BID) | ORAL | 1 refills | Status: DC

## 2021-07-14 MED ORDER — BUPROPION HCL ER (XL) 300 MG PO TB24: ORAL_TABLET | ORAL | 1 refills | Status: DC

## 2021-07-14 NOTE — Patient Instructions (Signed)
Continue your medications.  Follow up in 6 months.  Take care  Dr. Donis Kotowski  

## 2021-07-14 NOTE — Assessment & Plan Note (Signed)
Thick, hyperkeratotic toenails (great toes) with onychomycosis.  Referring to dermatology. ?

## 2021-07-14 NOTE — Assessment & Plan Note (Signed)
Stable.  Continue Abilify, bupropion, BuSpar. ?

## 2021-07-14 NOTE — Progress Notes (Signed)
? ?Subjective:  ?Patient ID: Patricia Becker, female    DOB: 07-Mar-1976  Age: 46 y.o. MRN: [id number] ? ?CC: ?Chief Complaint  ?Patient presents with  ? Establish Care  ?  Med refills  ? ? ?HPI: ? ?46 year old female presents to establish care. ? ?Patient suffers from anxiety and depression.  She states that she is well controlled on Abilify, Wellbutrin, and BuSpar.  Uses hydroxyzine infrequently.  Patient states that she would like her medications to be refilled. ? ?Patient states that she is otherwise doing well.  She does have 1 additional concern.  Patient has onychomycosis.  She states that it affects both of her great toes.  She states that her toenails are very thick.  She states that when something hits the toe it causes discomfort.  She would like to know whether there is anything that can be done for this.  She has tried numerous treatments without resolution. ? ?Patient Active Problem List  ? Diagnosis Date Noted  ? Obesity (BMI 30-39.9) 07/14/2021  ? Anxiety and depression 07/14/2021  ? Onychomycosis 07/14/2021  ? Vitamin D deficiency 07/07/2019  ? ? ?Social Hx   ?Social History  ? ?Socioeconomic History  ? Marital status: Married  ?  Spouse name: Dwayne  ? Number of children: 3  ? Years of education: Not on file  ? Highest education level: Bachelor's degree (e.g., BA, AB, BS)  ?Occupational History  ? Not on file  ?Tobacco Use  ? Smoking status: Never  ?  Passive exposure: Yes  ? Smokeless tobacco: Never  ?Vaping Use  ? Vaping Use: Never used  ?Substance and Sexual Activity  ? Alcohol use: Yes  ?  Alcohol/week: 2.0 standard drinks  ?  Types: 2 Glasses of wine per week  ?  Comment: occassionally   ? Drug use: No  ? Sexual activity: Yes  ?  Birth control/protection: Surgical  ?Other Topics Concern  ? Not on file  ?Social History Narrative  ? Moved from [place], [place] in 01/2017. Had lived there for over 20 years. Moved with husband, son, and daughter.   ?   ? Daughter is 10 y/o-married, expecting and lives in  [place]  ? Daughter is 75 y/o married, has 2 children: 3 and 1 y/o living with her right now  ? Son is 92 y/o.  ?   ? Daugther is 14 y/o- husband daughter-step daughter. Lives with her mother now.  ?   ? Enjoys walking trials, and working out when she could, netflix and chill, and family  ?   ? Diet: reports she use to meal prep, but has stopped, is supplementing with green powders, meats, fruits  ? Caffeine: 20 oz 3-4 time a week, no soda, tea- at times mixed sweet and unsweet  ? Water: 5-8 cups daily  ?   ? Wears seatbelt.   ? Wears sunscreen.  ? Smoke detectors at home.  ? Does not use phone while driving.  ? ?Social Determinants of Health  ? ?Financial Resource Strain: Not on file  ?Food Insecurity: Not on file  ?Transportation Needs: Not on file  ?Physical Activity: Not on file  ?Stress: Not on file  ?Social Connections: Not on file  ? ? ?Review of Systems  ?Constitutional: Negative.   ?Skin:   ?     Onychomycosis.  ? ? ?Objective:  ?BP 110/68   Pulse 69   Temp 97.7 ?F (36.5 ?C)   Ht 4\' 11"  (1.499 m)   Wt 190  lb 6.4 oz (86.4 kg)   SpO2 100%   BMI 38.46 kg/m?  ? ?BP/Weight 07/14/2021 08/01/2020 02/09/2020  ?Systolic BP 110 115 117  ?Diastolic BP 68 71 79  ?Wt. (Lbs) 190.4 196 162  ?BMI 38.46 38.28 26.15  ? ? ?Physical Exam ?Constitutional:   ?   General: She is not in acute distress. ?   Appearance: Normal appearance. She is obese. She is not ill-appearing.  ?HENT:  ?   Head: Normocephalic and atraumatic.  ?Eyes:  ?   General:     ?   Right eye: No discharge.     ?   Left eye: No discharge.  ?   Conjunctiva/sclera: Conjunctivae normal.  ?Cardiovascular:  ?   Rate and Rhythm: Normal rate and regular rhythm.  ?Pulmonary:  ?   Effort: Pulmonary effort is normal.  ?   Breath sounds: Normal breath sounds. No wheezing, rhonchi or rales.  ?Feet:  ?   Right foot:  ?   Toenail Condition: Right toenails are abnormally thick. Fungal disease present. ?   Left foot:  ?   Toenail Condition: Left toenails are abnormally  thick. Fungal disease present. ?Neurological:  ?   Mental Status: She is alert.  ?Psychiatric:     ?   Mood and Affect: Mood normal.     ?   Behavior: Behavior normal.  ? ? ?Lab Results  ?Component Value Date  ? WBC 8.7 04/13/2019  ? HGB 12.5 04/13/2019  ? HCT 39.7 04/13/2019  ? PLT 234 04/13/2019  ? GLUCOSE 86 04/13/2019  ? CHOL 136 07/08/2017  ? TRIG 79 07/08/2017  ? HDL 52 07/08/2017  ? LDLCALC 68 07/08/2017  ? ALT 22 04/13/2019  ? AST 13 (L) 04/13/2019  ? NA 140 04/13/2019  ? K 3.9 04/13/2019  ? CL 109 04/13/2019  ? CREATININE 0.79 04/13/2019  ? BUN 16 04/13/2019  ? CO2 23 04/13/2019  ? TSH 1.97 04/18/2019  ? ? ? ?Assessment & Plan:  ? ?Problem List Items Addressed This Visit   ? ?  ? Musculoskeletal and Integument  ? Onychomycosis  ?  Thick, hyperkeratotic toenails (great toes) with onychomycosis.  Referring to dermatology. ?  ?  ?  ? Other  ? Anxiety and depression  ?  Stable.  Continue Abilify, bupropion, BuSpar. ?  ?  ? Relevant Medications  ? ARIPiprazole (ABILIFY) 5 MG tablet  ? buPROPion (WELLBUTRIN XL) 300 MG 24 hr tablet  ? busPIRone (BUSPAR) 10 MG tablet  ? ? ?Meds ordered this encounter  ?Medications  ? ARIPiprazole (ABILIFY) 5 MG tablet  ?  Sig: TAKE 1 TABLET(5 MG) BY MOUTH DAILY  ?  Dispense:  90 tablet  ?  Refill:  1  ? buPROPion (WELLBUTRIN XL) 300 MG 24 hr tablet  ?  Sig: TAKE 1 TABLET(300 MG) BY MOUTH DAILY  ?  Dispense:  90 tablet  ?  Refill:  1  ? busPIRone (BUSPAR) 10 MG tablet  ?  Sig: Take 1 tablet (10 mg total) by mouth 2 (two) times daily.  ?  Dispense:  180 tablet  ?  Refill:  1  ? ? ?Follow-up:  Return in about 6 months (around 01/14/2022). ? ?Everlene Other DO ?[facility] ? ?

## 2021-08-08 ENCOUNTER — Ambulatory Visit
Admission: EM | Admit: 2021-08-08 | Discharge: 2021-08-08 | Disposition: A | Discharge status: Home / Self Care | Payer: Managed Care, Other (non HMO) | Attending: Urgent Care | Admitting: Urgent Care

## 2021-08-08 ENCOUNTER — Ambulatory Visit (INDEPENDENT_AMBULATORY_CARE_PROVIDER_SITE_OTHER): Payer: Managed Care, Other (non HMO)

## 2021-08-08 DIAGNOSIS — M25571 Pain in right ankle and joints of right foot: Secondary | ICD-10-CM

## 2021-08-08 DIAGNOSIS — M7661 Achilles tendinitis, right leg: Secondary | ICD-10-CM | POA: Diagnosis not present

## 2021-08-08 MED ORDER — NAPROXEN 500 MG PO TABS: 500.0000 mg | ORAL_TABLET | Freq: Two times a day (BID) | ORAL | 0 refills | Status: DC

## 2021-08-08 NOTE — ED Provider Notes (Signed)
?[facility] ? ? ?MRN: [id number] DOB: Sep 15, 1975 ? ?Subjective:  ? ?Patricia Becker is a 46 y.o. female presenting for 2 month history of persistent right lower ankle pain and swelling at the bottom of her Achilles tendon, around the top of her back heel.  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  No fall, trauma, history of musculoskeletal disorders. ? ?No current facility-administered medications for this encounter. ? ?Current Outpatient Medications:  ?  ARIPiprazole (ABILIFY) 5 MG tablet, TAKE 1 TABLET(5 MG) BY MOUTH DAILY, Disp: 90 tablet, Rfl: 1 ?  buPROPion (WELLBUTRIN XL) 300 MG 24 hr tablet, TAKE 1 TABLET(300 MG) BY MOUTH DAILY, Disp: 90 tablet, Rfl: 1 ?  busPIRone (BUSPAR) 10 MG tablet, Take 1 tablet (10 mg total) by mouth 2 (two) times daily., Disp: 180 tablet, Rfl: 1 ?  hydrOXYzine (VISTARIL) 25 MG capsule, Take 1 capsule (25 mg total) by mouth 3 (three) times daily., Disp: 90 capsule, Rfl: 2 ?  Melatonin 10 MG TABS, Take 10 mg by mouth at bedtime as needed (sleep)., Disp: , Rfl:  ?  Multiple Vitamin (MULTIVITAMIN) tablet, Take 1 tablet by mouth daily., Disp: , Rfl:   ? ?Allergies  ?Allergen Reactions  ? Demerol [Meperidine Hcl] Anaphylaxis  ? Meperidine Anaphylaxis  ? Aspirin Other (See Comments)  ?  Upset stomach  ? ? ?Past Medical History:  ?Diagnosis Date  ? Anxiety   ? Anxiety   ? Depression   ? Depression   ? Encounter for screening for HIV 07/08/2017  ? Polypharmacy 07/08/2017  ?  ? ?Past Surgical History:  ?Procedure Laterality Date  ? CESAREAN SECTION    ? COLONOSCOPY    ? 2017, normal.   ? KNEE SURGERY  1981  ? TUBAL LIGATION    ? ? ?Family History  ?Problem Relation Age of Onset  ? Colon polyps Mother   ? Suicidality Father   ? Hypertension Sister   ? Colon cancer Maternal Grandmother   ? Cancer Maternal Grandmother   ? Drug abuse Other   ? ? ?Social History  ? ?Tobacco Use  ? Smoking status:  Never  ?  Passive exposure: Yes  ? Smokeless tobacco: Never  ?Vaping Use  ? Vaping Use: Never used  ?Substance Use Topics  ? Alcohol use: Yes  ?  Alcohol/week: 2.0 standard drinks  ?  Types: 2 Glasses of wine per week  ?  Comment: occassionally   ? Drug use: No  ? ? ?ROS ? ? ?Objective:  ? ?Vitals: ?BP 109/63 (BP Location: Right Arm)   Pulse 73   Temp 98.2 ?F (36.8 ?C) (Oral)   Resp 18   LMP  (Within Days)   SpO2 96%  ? ?Physical Exam ?Constitutional:   ?   General: She is not in acute distress. ?   Appearance: Normal appearance. She is well-developed. She is not ill-appearing, toxic-appearing or diaphoretic.  ?HENT:  ?   Head: Normocephalic and atraumatic.  ?   Nose: Nose normal.  ?   Mouth/Throat:  ?   Mouth: Mucous membranes are moist.  ?Eyes:  ?   General: No scleral icterus.    ?   Right eye: No discharge.     ?   Left eye: No discharge.  ?   Extraocular Movements: Extraocular movements intact.  ?Cardiovascular:  ?   Rate and Rhythm: Normal rate.  ?Pulmonary:  ?  Effort: Pulmonary effort is normal.  ?Musculoskeletal:  ?   Right ankle: Swelling present. No deformity, ecchymosis or lacerations. Tenderness present. No lateral malleolus, medial malleolus, ATF ligament, AITF ligament, CF ligament, posterior TF ligament, base of 5th metatarsal or proximal fibula tenderness. Normal range of motion.  ?Skin: ?   General: Skin is warm and dry.  ?Neurological:  ?   General: No focal deficit present.  ?   Mental Status: She is alert and oriented to person, place, and time.  ?Psychiatric:     ?   Mood and Affect: Mood normal.     ?   Behavior: Behavior normal.  ? ? ?DG Ankle Complete Right ? ?Result Date: 08/08/2021 ?CLINICAL DATA:  Right ankle pain. EXAM: RIGHT ANKLE - COMPLETE 3+ VIEW COMPARISON:  None. FINDINGS: There is no evidence of fracture, dislocation, or joint effusion. There is no evidence of arthropathy or other focal bone abnormality. Mild Achilles enthesopathy. IMPRESSION: No evidence of fracture or  dislocation.  No significant arthropathy. Electronically Signed   By: Keane Police D.O.   On: 08/08/2021 10:46   ? ?Right ankle wrapped using 4" Ace wrap in figure-8 method. ? ?Assessment and Plan :  ? ?PDMP not reviewed this encounter. ? ?1. Achilles tendinitis of right lower extremity   ? ?Recommended rice method, NSAID, consultation with Dr. Aline Brochure with Ortho care.  Unfortunately, patient is not able to change the nature of her work but luckily she is can have a lot less burden on her feet this upcoming couple of weeks.  Recommended to rest is much as possible otherwise. Counseled patient on potential for adverse effects with medications prescribed/recommended today, ER and return-to-clinic precautions discussed, patient verbalized understanding. ? ?  ?Jaynee Eagles, PA-C ?08/08/21 1106 ? ?

## 2021-08-08 NOTE — ED Triage Notes (Signed)
Pt reports she has pain in the back of the right ankle x 1-2 months. Pt can not recall any injury. States she can not put pressure on.,  ?

## 2021-08-19 ENCOUNTER — Encounter: Payer: Self-pay | Admitting: Orthopaedic Surgery

## 2021-08-19 ENCOUNTER — Ambulatory Visit: Payer: Managed Care, Other (non HMO) | Admitting: Orthopaedic Surgery

## 2021-08-19 VITALS — BP 136/84 | HR 75 | Ht 60.0 in | Wt 190.8 lb

## 2021-08-19 DIAGNOSIS — M7671 Peroneal tendinitis, right leg: Secondary | ICD-10-CM | POA: Diagnosis not present

## 2021-08-19 NOTE — Progress Notes (Signed)
? ?Subjective:  ? ? Patient ID: Patricia Becker, female    DOB: [birth year], 46 y.o.   MRN: [id number] ? ?HPI ?She has had pain in the right ankle, Achilles area and laterally for several weeks. She was seen at Urgent Care on 08-08-21.  I have reviewed the notes and X-rays.  She has had much less pain of the Achilles but more lateral pain since then. She has a silicone support she got from [place] that really helps.  She has no redness, no trauma.  She is taking Aleve at times but not regularly.  The Aleve helps. ? ?Review of Systems  ?Constitutional:  Positive for activity change.  ?Musculoskeletal:  Positive for arthralgias, gait problem and joint swelling.  ?All other systems reviewed and are negative. ?For Review of Systems, all other systems reviewed and are negative. ? ?The following is a summary of the past history medically, past history surgically, known current medicines, social history and family history.  This information is gathered electronically by the computer from prior information and documentation.  I review this each visit and have found including this information at this point in the chart is beneficial and informative.  ? ?Past Medical History:  ?Diagnosis Date  ? Anxiety   ? Anxiety   ? Depression   ? Depression   ? Encounter for screening for HIV 07/08/2017  ? Polypharmacy 07/08/2017  ? ? ?Past Surgical History:  ?Procedure Laterality Date  ? CESAREAN SECTION    ? COLONOSCOPY    ? 2017, normal.   ? KNEE SURGERY  1981  ? TUBAL LIGATION    ? ? ?Current Outpatient Medications on File Prior to Visit  ?Medication Sig Dispense Refill  ? ARIPiprazole (ABILIFY) 5 MG tablet TAKE 1 TABLET(5 MG) BY MOUTH DAILY 90 tablet 1  ? buPROPion (WELLBUTRIN XL) 300 MG 24 hr tablet TAKE 1 TABLET(300 MG) BY MOUTH DAILY 90 tablet 1  ? busPIRone (BUSPAR) 10 MG tablet Take 1 tablet (10 mg total) by mouth 2 (two) times daily. 180 tablet 1  ? hydrOXYzine (VISTARIL) 25 MG capsule Take 1 capsule (25 mg total) by mouth 3 (three)  times daily. 90 capsule 2  ? Melatonin 10 MG TABS Take 10 mg by mouth at bedtime as needed (sleep).    ? Multiple Vitamin (MULTIVITAMIN) tablet Take 1 tablet by mouth daily.    ? naproxen (NAPROSYN) 500 MG tablet Take 1 tablet (500 mg total) by mouth 2 (two) times daily with a meal. 30 tablet 0  ? ?No current facility-administered medications on file prior to visit.  ? ? ?Social History  ? ?Socioeconomic History  ? Marital status: Married  ?  Spouse name: Dwayne  ? Number of children: 3  ? Years of education: Not on file  ? Highest education level: Bachelor's degree (e.g., BA, AB, BS)  ?Occupational History  ? Not on file  ?Tobacco Use  ? Smoking status: Never  ?  Passive exposure: Yes  ? Smokeless tobacco: Never  ?Vaping Use  ? Vaping Use: Never used  ?Substance and Sexual Activity  ? Alcohol use: Yes  ?  Alcohol/week: 2.0 standard drinks  ?  Types: 2 Glasses of wine per week  ?  Comment: occassionally   ? Drug use: No  ? Sexual activity: Yes  ?  Birth control/protection: Surgical  ?Other Topics Concern  ? Not on file  ?Social History Narrative  ? Moved from [place], [place] in 01/2017. Had lived there for over 20 years.  Moved with husband, son, and daughter.   ?   ? Daughter is 28 y/o-married, expecting and lives in [place]  ? Daughter is 21 y/o married, has 2 children: 3 and 1 y/o living with her right now  ? Son is 52 y/o.  ?   ? Daugther is 15 y/o- husband daughter-step daughter. Lives with her mother now.  ?   ? Enjoys walking trials, and working out when she could, netflix and chill, and family  ?   ? Diet: reports she use to meal prep, but has stopped, is supplementing with green powders, meats, fruits  ? Caffeine: 20 oz 3-4 time a week, no soda, tea- at times mixed sweet and unsweet  ? Water: 5-8 cups daily  ?   ? Wears seatbelt.   ? Wears sunscreen.  ? Smoke detectors at home.  ? Does not use phone while driving.  ? ?Social Determinants of Health  ? ?Financial Resource Strain: Not on file  ?Food Insecurity:  Not on file  ?Transportation Needs: Not on file  ?Physical Activity: Not on file  ?Stress: Not on file  ?Social Connections: Not on file  ?Intimate Partner Violence: Not on file  ? ? ?Family History  ?Problem Relation Age of Onset  ? Colon polyps Mother   ? Suicidality Father   ? Hypertension Sister   ? Colon cancer Maternal Grandmother   ? Cancer Maternal Grandmother   ? Drug abuse Other   ? ? ?BP 136/84   Pulse 75   Ht 5' (1.524 m)   Wt 190 lb 12.8 oz (86.5 kg)   BMI 37.26 kg/m?  ? ?Body mass index is 37.26 kg/m?. ? ?   ?Objective:  ? Physical Exam ?Vitals and nursing note reviewed. Exam conducted with a chaperone present.  ?Constitutional:   ?   Appearance: She is well-developed.  ?HENT:  ?   Head: Normocephalic and atraumatic.  ?Eyes:  ?   Conjunctiva/sclera: Conjunctivae normal.  ?   Pupils: Pupils are equal, round, and reactive to light.  ?Cardiovascular:  ?   Rate and Rhythm: Normal rate and regular rhythm.  ?Pulmonary:  ?   Effort: Pulmonary effort is normal.  ?Abdominal:  ?   Palpations: Abdomen is soft.  ?Musculoskeletal:  ?   Cervical back: Normal range of motion and neck supple.  ?     Feet: ? ?Skin: ?   General: Skin is warm and dry.  ?Neurological:  ?   Mental Status: She is alert and oriented to person, place, and time.  ?   Cranial Nerves: No cranial nerve deficit.  ?   Motor: No abnormal muscle tone.  ?   Coordination: Coordination normal.  ?   Deep Tendon Reflexes: Reflexes are normal and symmetric. Reflexes normal.  ?Psychiatric:     ?   Behavior: Behavior normal.     ?   Thought Content: Thought content normal.     ?   Judgment: Judgment normal.  ? ? ? ? ? ?   ?Assessment & Plan:  ? ?Encounter Diagnosis  ?Name Primary?  ? Peroneal tendinitis, right leg Yes  ? ?I have told her to use Aleve twice a day for the next two weeks regularly. ? ?I have told her to use Aspercreme, Biofreeze or Voltaren Gel to the area tid as needed. ? ?Continue her silicone support. ? ?Return in two weeks. ? ?Call if  any problem. ? ?Precautions discussed. ? ?Electronically Signed ?Darreld Mclean, MD ?4/11/202310:45 AM ? ?

## 2021-08-19 NOTE — Patient Instructions (Signed)
Aleve twice a day with food, for at least 2 weeks. ? ? ?Aspercreme, Biofreeze, Blue Emu or Voltaren Gel over the counter 2-3 times daily. Rub into area well each use for best results.  ?

## 2021-09-04 ENCOUNTER — Encounter: Payer: Self-pay | Admitting: Orthopaedic Surgery

## 2021-09-04 ENCOUNTER — Ambulatory Visit: Payer: Managed Care, Other (non HMO) | Admitting: Orthopaedic Surgery

## 2021-09-04 DIAGNOSIS — M7671 Peroneal tendinitis, right leg: Secondary | ICD-10-CM | POA: Diagnosis not present

## 2021-09-04 NOTE — Progress Notes (Signed)
I am better ? ?She has been using the Aleve and the rubs to the right posterior lateral ankle and is better.  She still has pain but she is improved.  She is pleased with her progress.  She has no redness, no swelling. ? ?She is slightly tender in the posterior lateral malleolus area over the peroneal tendons.  She has no swelling, no redness.  NV intact. ROM full. Gait is normal. ? ?Encounter Diagnosis  ?Name Primary?  ? Peroneal tendinitis, right leg Yes  ? ?Continue as she is doing. ? ?Return in six weeks. ? ?Call if any problem. ? ?Precautions discussed. ? ?Electronically Signed ?Sanjuana Kava, MD ?4/27/20239:36 AM ? ?

## 2021-10-21 ENCOUNTER — Encounter: Payer: Self-pay | Admitting: Orthopaedic Surgery

## 2021-10-21 ENCOUNTER — Ambulatory Visit: Payer: Managed Care, Other (non HMO) | Admitting: Orthopaedic Surgery

## 2021-10-21 VITALS — BP 126/71 | HR 72 | Ht 60.0 in | Wt 183.0 lb

## 2021-10-21 DIAGNOSIS — M7671 Peroneal tendinitis, right leg: Secondary | ICD-10-CM

## 2021-10-21 NOTE — Progress Notes (Signed)
My ankle is not better.  She still has pain of the right ankle.  She has pain in posterior lateral portion.  She has no new trauma.  She has no redness nor numbness.  Right ankle is tender over the posterior portion of the lateral ankle over the peroneal tendons.  She has no redness. ROM is full. She walks well using flip flops.  NV intact.  Encounter Diagnosis  Name Primary?   Peroneal tendinitis, right leg Yes   I will get MRI.  She has not improved since March.  Return July 11.  Call if any problem.  Precautions discussed.  Electronically Signed Darreld Mclean, MD 6/13/20239:24 AM

## 2021-10-21 NOTE — Addendum Note (Signed)
Addended by: Jodene Nam A on: 10/21/2021 11:30 AM   Modules accepted: Orders

## 2021-11-06 ENCOUNTER — Ambulatory Visit (HOSPITAL_COMMUNITY)
Admission: RE | Admit: 2021-11-06 | Discharge: 2021-11-06 | Disposition: A | Discharge status: Home / Self Care | Payer: Managed Care, Other (non HMO) | Source: Ambulatory Visit | Attending: Orthopaedic Surgery | Admitting: Orthopaedic Surgery

## 2021-11-06 DIAGNOSIS — M7671 Peroneal tendinitis, right leg: Secondary | ICD-10-CM | POA: Diagnosis present

## 2021-11-17 ENCOUNTER — Telehealth: Payer: Self-pay | Admitting: Orthopaedic Surgery

## 2021-11-17 NOTE — Telephone Encounter (Signed)
I had called patient, this appt moved, will sign for records at next appt, will also call [facility] for cd of MRI.

## 2021-11-17 NOTE — Telephone Encounter (Signed)
Patient called regarding her appointment for tomorrow w/Dr Hilda Lias for review of MRI results - states she would like to have a 2nd opinion in [place], and asked about getting records and MRI report. I relayed we can release once patient signs an authorization form, and would also need to sign and request copy of CD from [facility] radiology; however, mentioned that patient may still wish to have the review of MRI by Dr Hilda Lias. Please advise, and if a virtual visit is an option, please also advise.

## 2021-11-18 ENCOUNTER — Ambulatory Visit: Payer: Managed Care, Other (non HMO) | Admitting: Orthopaedic Surgery

## 2021-11-25 ENCOUNTER — Encounter: Payer: Self-pay | Admitting: Orthopaedic Surgery

## 2021-11-25 ENCOUNTER — Ambulatory Visit: Payer: Managed Care, Other (non HMO) | Admitting: Orthopaedic Surgery

## 2021-11-25 DIAGNOSIS — M7671 Peroneal tendinitis, right leg: Secondary | ICD-10-CM | POA: Diagnosis not present

## 2021-11-25 NOTE — Progress Notes (Signed)
My right foot still hurts.  She had MRI of the right ankle and it showed: IMPRESSION: Split peroneus brevis tear at and below the lateral malleolus, length of tear estimated to be approximately 2.4 cm, with continuity distally through its attachment on the base of the fifth metatarsal. Intact peroneal longus tendon.   Mild distal Achilles tendinosis and peritendinitis with retrocalcaneal bursitis. Low-grade partial deep surface tearing near the calcaneal insertion involving the central fibers.   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 Additional focal thickening of the central bundle just distally measuring up to 4 mm could reflect a small plantar fibroma.   I have explained the findings to her.  I will have Dr. Lajoyce Corners see her and get his evaluation.  She has been having pain since March and is not improving.  She agrees to this.  I have independently reviewed the MRI.      Right foot is tender at achilles area and peroneal side of the ankle with no swelling or redness today. ROM is full.  NV intact.  Slight limp right.  Encounter Diagnosis  Name Primary?   Peroneal tendinitis, right leg Yes   To see Dr. Lajoyce Corners  Call if any problem.  Precautions discussed.  Electronically Signed Darreld Mclean, MD 7/18/20239:30 AM

## 2021-11-25 NOTE — Patient Instructions (Signed)
Dr. Lajoyce Corners  [address] [place]

## 2021-11-25 NOTE — Telephone Encounter (Signed)
Patient's appointment for today, 11/25/21, with Dr Hilda Lias has been completed as scheduled. Patient signed authorization to release information form.

## 2021-12-11 ENCOUNTER — Ambulatory Visit: Payer: 59 | Admitting: Orthopedic Surgery

## 2021-12-18 ENCOUNTER — Ambulatory Visit: Payer: 59 | Admitting: Orthopedic Surgery

## 2022-01-14 ENCOUNTER — Ambulatory Visit: Payer: Self-pay | Admitting: Family Medicine

## 2022-01-15 ENCOUNTER — Other Ambulatory Visit: Payer: Self-pay | Admitting: *Deleted

## 2022-01-15 DIAGNOSIS — F32A Depression, unspecified: Secondary | ICD-10-CM

## 2022-01-15 MED ORDER — BUPROPION HCL ER (XL) 300 MG PO TB24: ORAL_TABLET | ORAL | 0 refills | Status: DC

## 2022-01-15 MED ORDER — ARIPIPRAZOLE 5 MG PO TABS: ORAL_TABLET | ORAL | 0 refills | Status: DC

## 2022-01-21 ENCOUNTER — Ambulatory Visit: Payer: 59 | Admitting: Family Medicine

## 2022-01-21 VITALS — BP 120/82 | HR 74 | Temp 97.4°F | Ht 60.0 in | Wt 190.0 lb

## 2022-01-21 DIAGNOSIS — Z13220 Encounter for screening for lipoid disorders: Secondary | ICD-10-CM | POA: Diagnosis not present

## 2022-01-21 DIAGNOSIS — B351 Tinea unguium: Secondary | ICD-10-CM

## 2022-01-21 DIAGNOSIS — Z23 Encounter for immunization: Secondary | ICD-10-CM | POA: Diagnosis not present

## 2022-01-21 DIAGNOSIS — L989 Disorder of the skin and subcutaneous tissue, unspecified: Secondary | ICD-10-CM | POA: Insufficient documentation

## 2022-01-21 DIAGNOSIS — F419 Anxiety disorder, unspecified: Secondary | ICD-10-CM

## 2022-01-21 DIAGNOSIS — Z79899 Other long term (current) drug therapy: Secondary | ICD-10-CM

## 2022-01-21 DIAGNOSIS — F32A Depression, unspecified: Secondary | ICD-10-CM

## 2022-01-21 MED ORDER — BUPROPION HCL ER (XL) 300 MG PO TB24: ORAL_TABLET | ORAL | 3 refills | Status: DC

## 2022-01-21 MED ORDER — TERBINAFINE HCL 250 MG PO TABS
250.0000 mg | ORAL_TABLET | Freq: Every day | ORAL | 0 refills | Status: DC
Start: 2022-01-21 — End: 2022-05-19

## 2022-01-21 MED ORDER — ARIPIPRAZOLE 5 MG PO TABS
ORAL_TABLET | ORAL | 3 refills | Status: DC
Start: 2022-01-21 — End: 2023-02-04

## 2022-01-21 MED ORDER — HYDROXYZINE PAMOATE 25 MG PO CAPS: 25.0000 mg | ORAL_CAPSULE | Freq: Three times a day (TID) | ORAL | 2 refills | Status: DC | PRN

## 2022-01-21 MED ORDER — NAPROXEN 500 MG PO TABS: 500.0000 mg | ORAL_TABLET | Freq: Two times a day (BID) | ORAL | 0 refills | Status: DC

## 2022-01-21 MED ORDER — BUSPIRONE HCL 10 MG PO TABS: 10.0000 mg | ORAL_TABLET | Freq: Two times a day (BID) | ORAL | 3 refills | Status: DC

## 2022-01-21 NOTE — Patient Instructions (Signed)
Medications refilled.  Labs in 6 weeks.  Try [facility] in [place].  Follow up in 6 months.  Take care  Dr. Adriana Simas

## 2022-01-21 NOTE — Progress Notes (Signed)
Subjective:  Patient ID: Patricia Becker, female    DOB: 1975/08/19  Age: 46 y.o. MRN: [id number]  CC: Chief Complaint  Patient presents with   Follow-up    Needs naproxen refill, flu vac and new referral to psych    HPI:  46 year old female with anxiety and depression presents for follow-up.  Patient's anxiety and depression are stable.  She would like to see a Social worker.  Patient needs flu vaccine.  She would like to get this today.  Patient requesting refill on naproxen.  She is using this for peroneal tendinitis.  She continues to have difficulty with onychomycosis.  She would like to discuss treatment options today.  Referral was previously recommended but was not done.  Additionally, she reports that for the past 2 weeks she has had a raised skin lesion on the left side of her face.  Has not improved with over-the-counter treatment.    Patient Active Problem List   Diagnosis Date Noted   Facial skin lesion 01/21/2022   Obesity (BMI 30-39.9) 07/14/2021   Anxiety and depression 07/14/2021   Onychomycosis 07/14/2021   Vitamin D deficiency 07/07/2019    Social Hx   Social History   Socioeconomic History   Marital status: Married    Spouse name: Dwayne   Number of children: 3   Years of education: Not on file   Highest education level: Bachelor's degree (e.g., BA, AB, BS)  Occupational History   Not on file  Tobacco Use   Smoking status: Never    Passive exposure: Yes   Smokeless tobacco: Never  Vaping Use   Vaping Use: Never used  Substance and Sexual Activity   Alcohol use: Yes    Alcohol/week: 2.0 standard drinks of alcohol    Types: 2 Glasses of wine per week    Comment: occassionally    Drug use: No   Sexual activity: Yes    Birth control/protection: Surgical  Other Topics Concern   Not on file  Social History Narrative   Moved from [place], [place] in 01/2017. Had lived there for over 20 years. Moved with husband, son, and daughter.       Daughter is  58 y/o-married, expecting and lives in [place]   Daughter is 10 y/o married, has 2 children: 30 and 29 y/o living with her right now   Son is 38 y/o.      Daugther is 54 y/o- husband daughter-step daughter. Lives with her mother now.      Enjoys walking trials, and working out when she could, netflix and chill, and family      Diet: reports she use to meal prep, but has stopped, is supplementing with green powders, meats, fruits   Caffeine: 20 oz 3-4 time a week, no soda, tea- at times mixed sweet and unsweet   Water: 5-8 cups daily      Wears seatbelt.    Wears sunscreen.   Smoke detectors at home.   Does not use phone while driving.   Social Determinants of Health   Financial Resource Strain: Low Risk  (01/31/2019)   Overall Financial Resource Strain (CARDIA)    Difficulty of Paying Living Expenses: Not hard at all  Food Insecurity: No Food Insecurity (01/31/2019)   Hunger Vital Sign    Worried About Running Out of Food in the Last Year: Never true    Ran Out of Food in the Last Year: Never true  Transportation Needs: No Transportation Needs (01/31/2019)   PRAPARE -  Hydrologist (Medical): No    Lack of Transportation (Non-Medical): No  Physical Activity: Inactive (01/31/2019)   Exercise Vital Sign    Days of Exercise per Week: 0 days    Minutes of Exercise per Session: 0 min  Stress: No Stress Concern Present (01/31/2019)   [place]    Feeling of Stress : Only a little  Social Connections: Moderately Isolated (01/31/2019)   Social Connection and Isolation Panel [NHANES]    Frequency of Communication with Friends and Family: More than three times a week    Frequency of Social Gatherings with Friends and Family: Three times a week    Attends Religious Services: Never    Active Member of Clubs or Organizations: No    Attends Archivist Meetings: Never    Marital Status:  Married    Review of Systems Per HPI  Objective:  BP 120/82   Pulse 74   Temp (!) 97.4 F (36.3 C)   Ht 5' (1.524 m)   Wt 190 lb (86.2 kg)   SpO2 98%   BMI 37.11 kg/m      01/21/2022    8:29 AM 10/21/2021    9:02 AM 08/19/2021    8:53 AM  BP/Weight  Systolic BP 956 387 564  Diastolic BP 82 71 84  Wt. (Lbs) 190 183 190.8  BMI 37.11 kg/m2 35.74 kg/m2 37.26 kg/m2    Physical Exam Vitals and nursing note reviewed.  Constitutional:      General: She is not in acute distress.    Appearance: Normal appearance.  HENT:     Head: Normocephalic and atraumatic.      Comments: Raised, circular skin lesion which is soft.  No surrounding erythema. Eyes:     General:        Right eye: No discharge.        Left eye: No discharge.     Conjunctiva/sclera: Conjunctivae normal.  Cardiovascular:     Rate and Rhythm: Normal rate and regular rhythm.  Pulmonary:     Effort: Pulmonary effort is normal.     Breath sounds: Normal breath sounds. No wheezing, rhonchi or rales.  Neurological:     Mental Status: She is alert.  Psychiatric:        Mood and Affect: Mood normal.        Behavior: Behavior normal.     Lab Results  Component Value Date   WBC 8.7 04/13/2019   HGB 12.5 04/13/2019   HCT 39.7 04/13/2019   PLT 234 04/13/2019   GLUCOSE 86 04/13/2019   CHOL 136 07/08/2017   TRIG 79 07/08/2017   HDL 52 07/08/2017   LDLCALC 68 07/08/2017   ALT 22 04/13/2019   AST 13 (L) 04/13/2019   NA 140 04/13/2019   K 3.9 04/13/2019   CL 109 04/13/2019   CREATININE 0.79 04/13/2019   BUN 16 04/13/2019   CO2 23 04/13/2019   TSH 1.97 04/18/2019     Assessment & Plan:   Problem List Items Addressed This Visit       Musculoskeletal and Integument   Facial skin lesion    Referring to dermatology.      Relevant Orders   Ambulatory referral to Dermatology   Onychomycosis    Starting on terbinafine.  Recommended labs in 6 weeks.  Orders for labs placed.      Relevant  Medications   terbinafine (LAMISIL) 250 MG  tablet     Other   Anxiety and depression - Primary    Stable.  Continue current medications.  Medications refilled today. Recommended [facility] in [place].      Relevant Medications   ARIPiprazole (ABILIFY) 5 MG tablet   busPIRone (BUSPAR) 10 MG tablet   buPROPion (WELLBUTRIN XL) 300 MG 24 hr tablet   hydrOXYzine (VISTARIL) 25 MG capsule   Other Visit Diagnoses     High risk medication use       Relevant Orders   CBC   CMP14+EGFR   Screening, lipid       Relevant Orders   Lipid panel   Immunization due       Relevant Orders   Flu Vaccine QUAD 48moIM (Fluarix, Fluzone & Alfiuria Quad PF) (Completed)       Meds ordered this encounter  Medications   naproxen (NAPROSYN) 500 MG tablet    Sig: Take 1 tablet (500 mg total) by mouth 2 (two) times daily with a meal.    Dispense:  30 tablet    Refill:  0   terbinafine (LAMISIL) 250 MG tablet    Sig: Take 1 tablet (250 mg total) by mouth daily.    Dispense:  90 tablet    Refill:  0   ARIPiprazole (ABILIFY) 5 MG tablet    Sig: TAKE 1 TABLET(5 MG) BY MOUTH DAILY    Dispense:  90 tablet    Refill:  3   busPIRone (BUSPAR) 10 MG tablet    Sig: Take 1 tablet (10 mg total) by mouth 2 (two) times daily.    Dispense:  180 tablet    Refill:  3   buPROPion (WELLBUTRIN XL) 300 MG 24 hr tablet    Sig: TAKE 1 TABLET(300 MG) BY MOUTH DAILY    Dispense:  90 tablet    Refill:  3   hydrOXYzine (VISTARIL) 25 MG capsule    Sig: Take 1 capsule (25 mg total) by mouth every 8 (eight) hours as needed for anxiety.    Dispense:  90 capsule    Refill:  2    Follow-up: 6 months  [facility]

## 2022-01-21 NOTE — Assessment & Plan Note (Signed)
Starting on terbinafine.  Recommended labs in 6 weeks.  Orders for labs placed.

## 2022-01-21 NOTE — Assessment & Plan Note (Addendum)
Stable.  Continue current medications.  Medications refilled today. Recommended [facility] in [place].

## 2022-01-21 NOTE — Assessment & Plan Note (Signed)
Referring to dermatology. 

## 2022-04-20 DIAGNOSIS — M7661 Achilles tendinitis, right leg: Secondary | ICD-10-CM | POA: Diagnosis not present

## 2022-04-20 IMAGING — MG DIGITAL DIAGNOSTIC BILAT W/ TOMO W/ CAD
8 series · 8 of 24 positions shown · non-contrast
Comparison: Previous exams.

CLINICAL DATA: Screening recall for possible right breast asymmetry
and left breast mass.

EXAM:
DIGITAL DIAGNOSTIC BILATERAL MAMMOGRAM WITH TOMO AND CAD; ULTRASOUND
LEFT BREAST LIMITED; ULTRASOUND RIGHT BREAST LIMITED

[L MLO synth-2D]
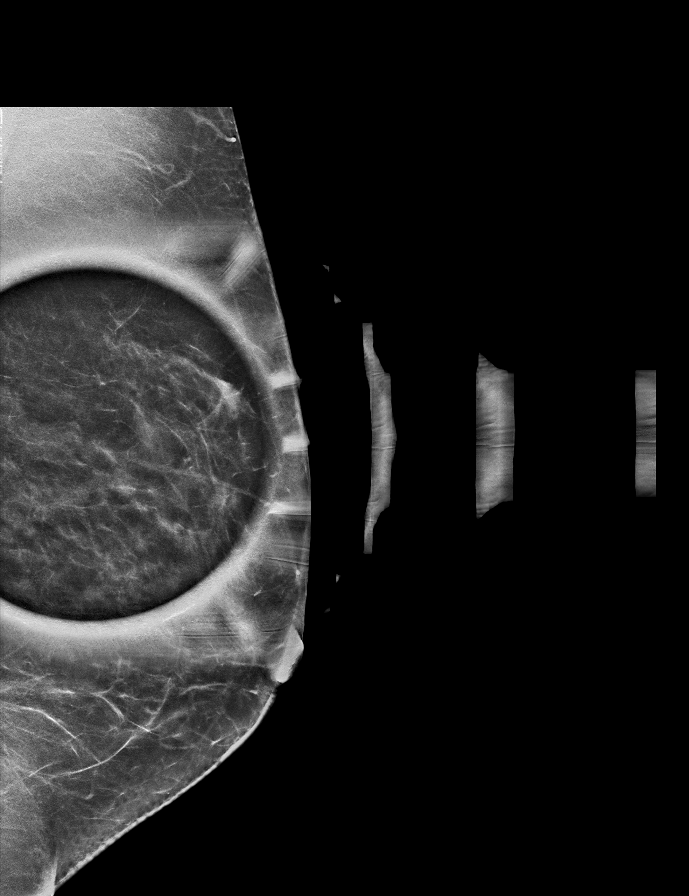

[L CC synth-2D]
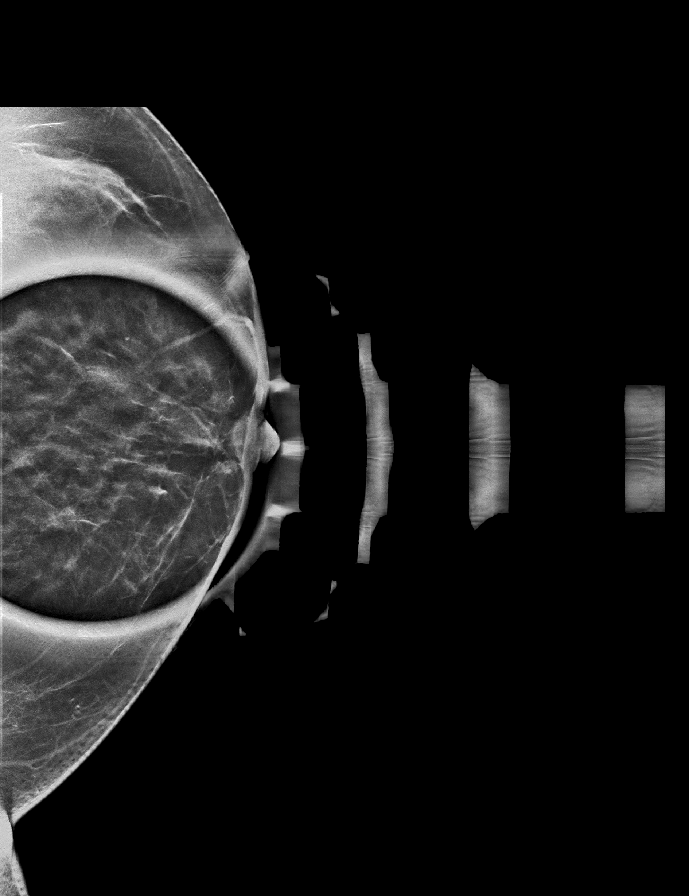

[R CC synth-2D]
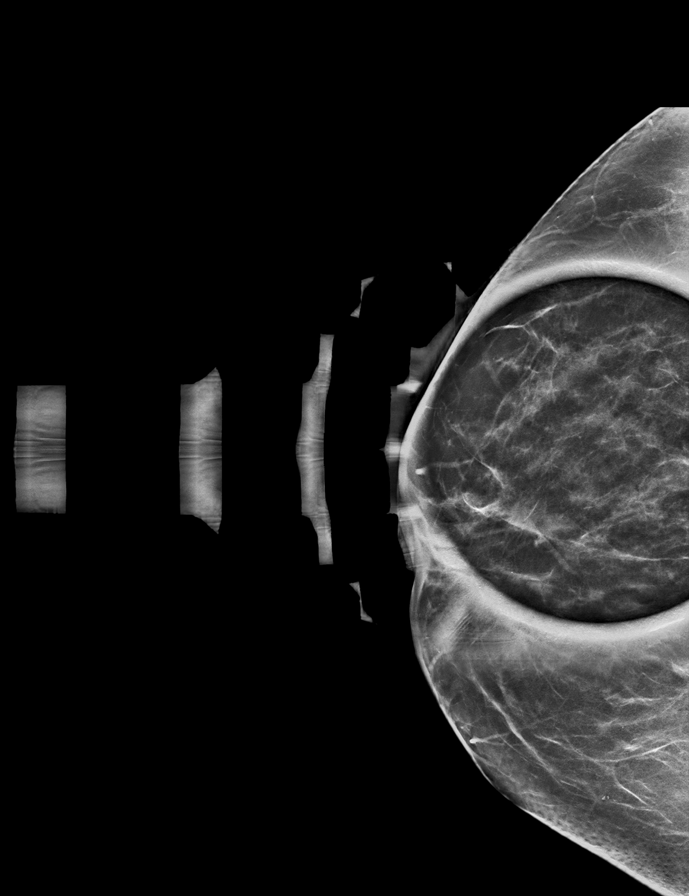

[R ML synth-2D]
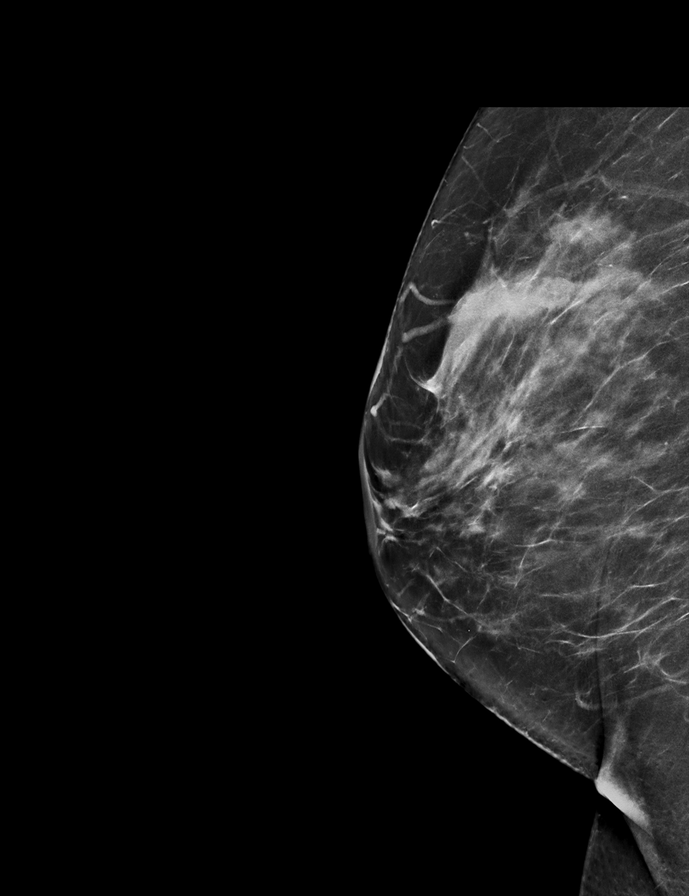

[L MLO tomo · tomo slice 30/59.0]
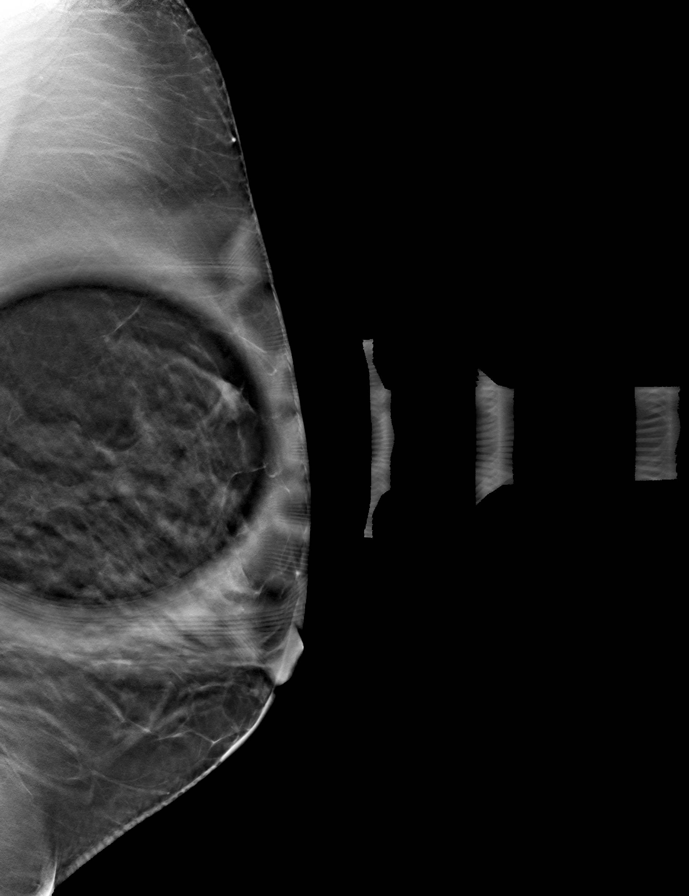

[L CC tomo · tomo slice 25/50.0]
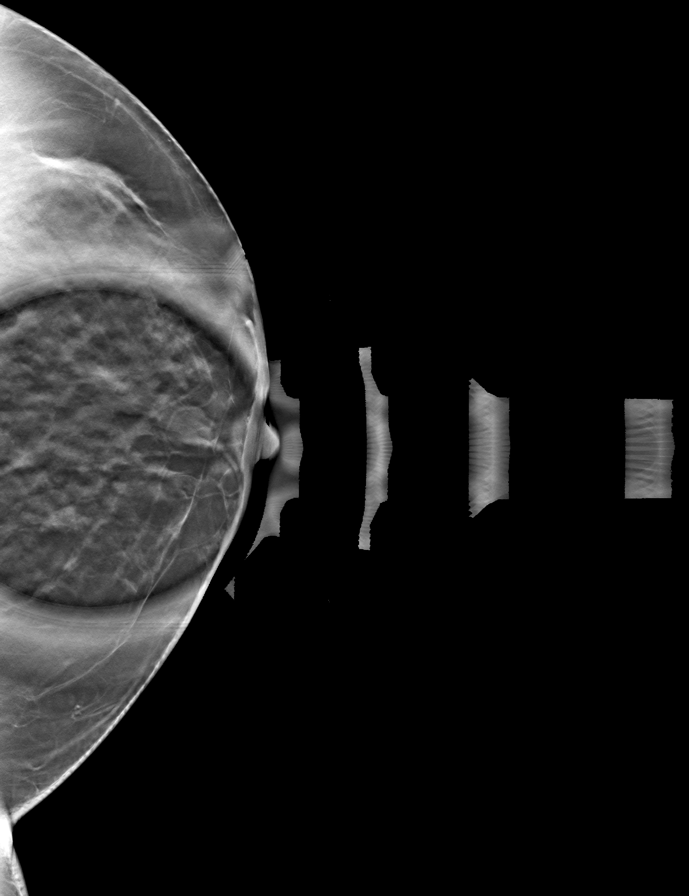

[R ML tomo · tomo slice 36/71.0]
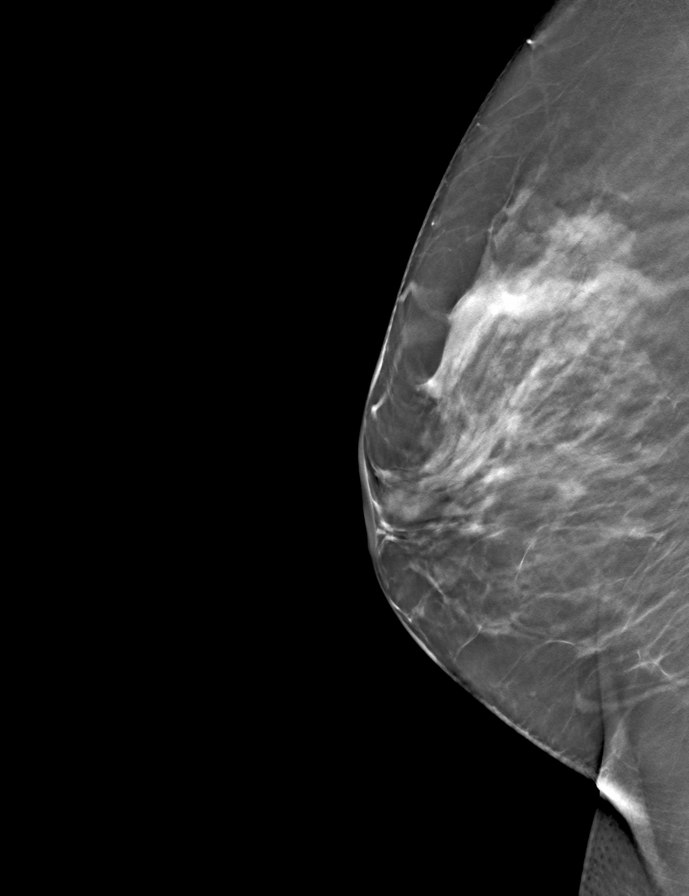

[R CC tomo · tomo slice 25/50.0]
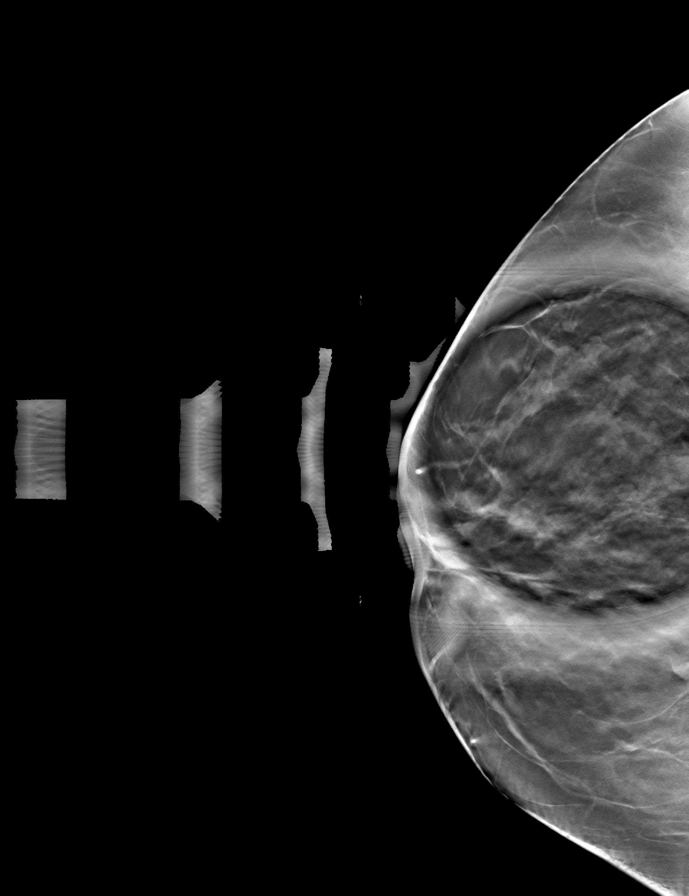

[8 of 24 positions shown; findings below may reference images not displayed]

ACR Breast Density Category c: The breast tissue is heterogeneously
dense, which may obscure small masses.
FINDINGS: Additional tomograms were performed of the bilateral breasts. There
is a persistent oval asymmetry in the upper-outer posterior right
breast projecting off the edge of the film and measuring
approximately 1 cm. There is an oval circumscribed mass in the upper
to slightly inner left breast best visualized on the initial
screening tomograms measuring approximately 2.4 cm.

Mammographic images were processed with CAD.

Targeted ultrasound of the left breast was performed. There is a
large cyst at the [DATE] position 5 cm from nipple measuring 2.4 x
1.5 x 2.5 cm. This corresponds well with the mass seen in the left
breast at mammography. Additional smaller cysts are seen in the
superior left breast.

Targeted ultrasound of the right breast was performed. There is a
cyst at 12 o'clock 6 cm from nipple posterior depth measuring 0.9 x
0.5 x 1 cm. This is felt to correspond with the mass seen in the
outer posterior right breast at mammography. Numerous additional
cysts and clusters of cysts are seen within the superior right
breast.
IMPRESSION: Bilateral breast cysts.  No findings of malignancy in either breast.

RECOMMENDATION:
Screening mammogram in one year.(Code:L6-K-G5T)

I have discussed the findings and recommendations with the patient.
If applicable, a reminder letter will be sent to the patient
regarding the next appointment.

BI-RADS CATEGORY  2: Benign.

## 2022-04-24 ENCOUNTER — Telehealth: Payer: Self-pay

## 2022-04-24 DIAGNOSIS — Z886 Allergy status to analgesic agent status: Secondary | ICD-10-CM | POA: Diagnosis not present

## 2022-04-24 DIAGNOSIS — R0602 Shortness of breath: Secondary | ICD-10-CM | POA: Diagnosis not present

## 2022-04-24 DIAGNOSIS — Z885 Allergy status to narcotic agent status: Secondary | ICD-10-CM | POA: Diagnosis not present

## 2022-04-24 DIAGNOSIS — R1013 Epigastric pain: Secondary | ICD-10-CM | POA: Diagnosis not present

## 2022-04-24 DIAGNOSIS — K2970 Gastritis, unspecified, without bleeding: Secondary | ICD-10-CM | POA: Diagnosis not present

## 2022-04-24 DIAGNOSIS — Z79899 Other long term (current) drug therapy: Secondary | ICD-10-CM | POA: Diagnosis not present

## 2022-04-24 NOTE — Telephone Encounter (Signed)
Transition Care Management Unsuccessful Follow-up Telephone Call  Date of discharge and from where:  12/15; [facility]   Attempts:  1st Attempt  Reason for unsuccessful TCM follow-up call:  Left voice message

## 2022-04-27 ENCOUNTER — Other Ambulatory Visit: Payer: Self-pay | Admitting: Family Medicine

## 2022-04-27 DIAGNOSIS — F419 Anxiety disorder, unspecified: Secondary | ICD-10-CM

## 2022-04-27 DIAGNOSIS — M7661 Achilles tendinitis, right leg: Secondary | ICD-10-CM | POA: Diagnosis not present

## 2022-04-29 ENCOUNTER — Other Ambulatory Visit (HOSPITAL_COMMUNITY): Payer: Self-pay | Admitting: Orthopaedic Surgery

## 2022-04-29 DIAGNOSIS — M7660 Achilles tendinitis, unspecified leg: Secondary | ICD-10-CM

## 2022-05-01 DIAGNOSIS — R1013 Epigastric pain: Secondary | ICD-10-CM | POA: Diagnosis not present

## 2022-05-01 DIAGNOSIS — K8020 Calculus of gallbladder without cholecystitis without obstruction: Secondary | ICD-10-CM | POA: Diagnosis not present

## 2022-05-07 DIAGNOSIS — U071 COVID-19: Secondary | ICD-10-CM | POA: Diagnosis not present

## 2022-05-07 DIAGNOSIS — Z6838 Body mass index (BMI) 38.0-38.9, adult: Secondary | ICD-10-CM | POA: Diagnosis not present

## 2022-05-07 DIAGNOSIS — E669 Obesity, unspecified: Secondary | ICD-10-CM | POA: Diagnosis not present

## 2022-05-07 DIAGNOSIS — R059 Cough, unspecified: Secondary | ICD-10-CM | POA: Diagnosis not present

## 2022-05-07 DIAGNOSIS — R030 Elevated blood-pressure reading, without diagnosis of hypertension: Secondary | ICD-10-CM | POA: Diagnosis not present

## 2022-05-12 ENCOUNTER — Ambulatory Visit (HOSPITAL_COMMUNITY)
Admission: RE | Admit: 2022-05-12 | Discharge: 2022-05-12 | Disposition: A | Discharge status: Home / Self Care | Payer: BC Managed Care – PPO | Source: Ambulatory Visit | Attending: Orthopaedic Surgery | Admitting: Orthopaedic Surgery

## 2022-05-12 DIAGNOSIS — S86311A Strain of muscle(s) and tendon(s) of peroneal muscle group at lower leg level, right leg, initial encounter: Secondary | ICD-10-CM | POA: Diagnosis not present

## 2022-05-12 DIAGNOSIS — M7660 Achilles tendinitis, unspecified leg: Secondary | ICD-10-CM

## 2022-05-19 ENCOUNTER — Other Ambulatory Visit (HOSPITAL_COMMUNITY)
Admission: RE | Admit: 2022-05-19 | Discharge: 2022-05-19 | Disposition: A | Discharge status: Home / Self Care | Payer: BC Managed Care – PPO | Source: Ambulatory Visit | Attending: Adult Health | Admitting: Adult Health

## 2022-05-19 ENCOUNTER — Encounter: Payer: Self-pay | Admitting: Adult Health

## 2022-05-19 ENCOUNTER — Ambulatory Visit (INDEPENDENT_AMBULATORY_CARE_PROVIDER_SITE_OTHER): Payer: BC Managed Care – PPO | Admitting: Adult Health

## 2022-05-19 VITALS — BP 124/76 | HR 68 | Ht 60.0 in | Wt 203.0 lb

## 2022-05-19 DIAGNOSIS — Z01419 Encounter for gynecological examination (general) (routine) without abnormal findings: Secondary | ICD-10-CM

## 2022-05-19 DIAGNOSIS — R232 Flushing: Secondary | ICD-10-CM

## 2022-05-19 DIAGNOSIS — N951 Menopausal and female climacteric states: Secondary | ICD-10-CM | POA: Insufficient documentation

## 2022-05-19 DIAGNOSIS — Z1211 Encounter for screening for malignant neoplasm of colon: Secondary | ICD-10-CM

## 2022-05-19 DIAGNOSIS — N939 Abnormal uterine and vaginal bleeding, unspecified: Secondary | ICD-10-CM | POA: Insufficient documentation

## 2022-05-19 DIAGNOSIS — R102 Pelvic and perineal pain: Secondary | ICD-10-CM

## 2022-05-19 LAB — HEMOCCULT GUIAC POC 1CARD (OFFICE): Fecal Occult Blood, POC: NEGATIVE

## 2022-05-19 NOTE — Progress Notes (Signed)
Patient ID: Patricia Becker, female   DOB: [birth year], 47 y.o.   MRN: [id number] History of Present Illness: Patricia Becker is a 47 year old white female, married [id number] in for a well woman gyn exam and pap. She had bleeding heavy to light the entire month of November. Has had some hot flashes,night sweats, headaches and moody, and can't lose weight.  She is a travel Marine scientist. She has boot on right foot due to tendon issue, will have to have surgery.  She had COVID 05/04/22.  PCP is Dr Lacinda Axon    Current Medications, Allergies, Past Medical History, Past Surgical History, Family History and Social History were reviewed in [facility] record.     Review of Systems: Patient denies any  hearing loss, fatigue, blurred vision, shortness of breath, chest pain, abdominal pain, problems with bowel movements, urination, or intercourse. No joint pain. See HPI for positives.      Physical Exam:BP 124/76 (BP Location: Left Arm, Patient Position: Sitting, Cuff Size: Normal)   Pulse 68   Ht 5' (1.524 m)   Wt 203 lb (92.1 kg)   LMP 05/12/2022   BMI 39.65 kg/m   General:  Well developed, well nourished, no acute distress Skin:  Warm and dry Neck:  Midline trachea, normal thyroid, good ROM, no lymphadenopathy Lungs; Clear to auscultation bilaterally Breast:  No dominant palpable mass, retraction, or nipple discharge Cardiovascular: Regular rate and rhythm Abdomen:  Soft, non tender, no hepatosplenomegaly Pelvic:  External genitalia is normal in appearance, no lesions.  The vagina is normal in appearance. Urethra has no lesions or masses. The cervix is bulbous.pap with HR HPV genotyping performed.  Uterus is felt to be normal size, shape, and contour.  No adnexal masses, +tenderness over ovaries noted.Bladder is non tender, no masses felt. Rectal: Good sphincter tone, no polyps, or hemorrhoids felt.  Hemoccult negative. Extremities/musculoskeletal:  No swelling or varicosities noted, no  clubbing or cyanosis Psych:  No mood changes, alert and cooperative,seems happy AA is 1 Fall risk is low    05/19/2022   11:51 AM 04/28/2021    9:52 AM 01/22/2021    1:20 PM  Depression screen PHQ 2/9  Decreased Interest 0 0 2  Down, Depressed, Hopeless 0 0 2  PHQ - 2 Score 0 0 4  Altered sleeping 1  2  Tired, decreased energy 0  1  Change in appetite 0  1  Feeling bad or failure about yourself  0  1  Trouble concentrating 0  2  Moving slowly or fidgety/restless 0    Suicidal thoughts 0  0  PHQ-9 Score 1  11  Difficult doing work/chores   Somewhat difficult    On meds from PCP    05/19/2022   11:51 AM 01/22/2021    1:21 PM 12/25/2020    3:49 PM 02/09/2020   11:37 AM  GAD 7 : Generalized Anxiety Score  Nervous, Anxious, on Edge 1 3 3  0  Control/stop worrying 1 3 0 0  Worry too much - different things 0 2 0 0  Trouble relaxing 0 2 3 0  Restless 0 1 0 0  Easily annoyed or irritable 0 2 0 0  Afraid - awful might happen 1 3 3  0  Total GAD 7 Score 3 16 9  0  Anxiety Difficulty  Very difficult Not difficult at all Not difficult at all      Upstream - 05/19/22 1150       Pregnancy Intention Screening  Does the patient want to become pregnant in the next year? No    Does the patient's partner want to become pregnant in the next year? No    Would the patient like to discuss contraceptive options today? No      Contraception Wrap Up   Current Method Female Sterilization    End Method Female Sterilization    Contraception Counseling Provided No            Examination chaperoned by Malachy Mood LPN  Impression and plan: 1. Encounter for gynecological examination with Papanicolaou smear of cervix Pap sent Pap in 3 years if norma; GYN physical in 1 year - Cytology - PAP( [facility]) Colonoscopy 05/25/22 in [place]  She will schedule mammogram   2. Encounter for screening fecal occult blood testing Hemoccult was negative  - POCT occult blood stool  3. Abnormal uterine  bleeding (AUB) Will get pelvic US to assess uterus an ovaries, scheduled for 05/26/22 at 11:30 am at [facility]  Will talk when results back  - US PELVIC COMPLETE WITH TRANSVAGINAL; Future  4. Hot flashes Discussed HRT as option if gets worse   5. Tenderness of female pelvic organs [place] scheduled for 05/26/22 - US PELVIC COMPLETE WITH TRANSVAGINAL; Future  6. Perimenopause Review handout

## 2022-05-20 DIAGNOSIS — M7661 Achilles tendinitis, right leg: Secondary | ICD-10-CM | POA: Diagnosis not present

## 2022-05-25 LAB — CYTOLOGY - PAP
Chlamydia: NEGATIVE
Comment: NEGATIVE
Comment: NEGATIVE
Comment: NORMAL
Diagnosis: UNDETERMINED — AB
High risk HPV: NEGATIVE
Neisseria Gonorrhea: NEGATIVE

## 2022-05-26 ENCOUNTER — Other Ambulatory Visit (HOSPITAL_COMMUNITY): Payer: Self-pay | Admitting: Adult Health

## 2022-05-26 ENCOUNTER — Ambulatory Visit (HOSPITAL_COMMUNITY)
Admission: RE | Admit: 2022-05-26 | Discharge: 2022-05-26 | Disposition: A | Discharge status: Home / Self Care | Payer: BC Managed Care – PPO | Source: Ambulatory Visit | Attending: Adult Health | Admitting: Adult Health

## 2022-05-26 ENCOUNTER — Other Ambulatory Visit: Payer: Self-pay | Admitting: Adult Health

## 2022-05-26 ENCOUNTER — Encounter: Payer: Self-pay | Admitting: Adult Health

## 2022-05-26 DIAGNOSIS — Z1231 Encounter for screening mammogram for malignant neoplasm of breast: Secondary | ICD-10-CM

## 2022-05-26 DIAGNOSIS — R87610 Atypical squamous cells of undetermined significance on cytologic smear of cervix (ASC-US): Secondary | ICD-10-CM | POA: Insufficient documentation

## 2022-05-26 DIAGNOSIS — N939 Abnormal uterine and vaginal bleeding, unspecified: Secondary | ICD-10-CM

## 2022-05-26 DIAGNOSIS — R102 Pelvic and perineal pain: Secondary | ICD-10-CM | POA: Diagnosis not present

## 2022-05-26 DIAGNOSIS — N888 Other specified noninflammatory disorders of cervix uteri: Secondary | ICD-10-CM | POA: Diagnosis not present

## 2022-05-26 MED ORDER — METRONIDAZOLE 500 MG PO TABS: 500.0000 mg | ORAL_TABLET | Freq: Two times a day (BID) | ORAL | 0 refills | Status: DC

## 2022-05-26 NOTE — Progress Notes (Signed)
+  BV on pap will rx flagyl  

## 2022-06-03 ENCOUNTER — Ambulatory Visit (HOSPITAL_COMMUNITY)
Admission: RE | Admit: 2022-06-03 | Discharge: 2022-06-03 | Disposition: A | Discharge status: Home / Self Care | Payer: BC Managed Care – PPO | Source: Ambulatory Visit | Attending: Adult Health | Admitting: Adult Health

## 2022-06-03 DIAGNOSIS — Z1231 Encounter for screening mammogram for malignant neoplasm of breast: Secondary | ICD-10-CM | POA: Diagnosis not present

## 2022-06-24 DIAGNOSIS — Z1211 Encounter for screening for malignant neoplasm of colon: Secondary | ICD-10-CM | POA: Diagnosis not present

## 2022-06-24 DIAGNOSIS — R1013 Epigastric pain: Secondary | ICD-10-CM | POA: Diagnosis not present

## 2022-06-24 DIAGNOSIS — K2950 Unspecified chronic gastritis without bleeding: Secondary | ICD-10-CM | POA: Diagnosis not present

## 2022-06-24 DIAGNOSIS — Z8371 Family history of adenomatous and serrated polyps: Secondary | ICD-10-CM | POA: Diagnosis not present

## 2022-06-24 DIAGNOSIS — K635 Polyp of colon: Secondary | ICD-10-CM | POA: Diagnosis not present

## 2022-06-24 DIAGNOSIS — K449 Diaphragmatic hernia without obstruction or gangrene: Secondary | ICD-10-CM | POA: Diagnosis not present

## 2022-06-24 DIAGNOSIS — Z83719 Family history of colon polyps, unspecified: Secondary | ICD-10-CM | POA: Diagnosis not present

## 2022-06-29 ENCOUNTER — Other Ambulatory Visit: Payer: Self-pay | Admitting: Family Medicine

## 2022-06-29 DIAGNOSIS — F32A Depression, unspecified: Secondary | ICD-10-CM

## 2022-07-09 ENCOUNTER — Encounter: Payer: Self-pay | Admitting: Radiology

## 2022-07-13 DIAGNOSIS — R262 Difficulty in walking, not elsewhere classified: Secondary | ICD-10-CM | POA: Diagnosis not present

## 2022-07-24 ENCOUNTER — Other Ambulatory Visit: Payer: Self-pay

## 2022-07-24 ENCOUNTER — Encounter: Payer: Self-pay | Admitting: Emergency Medicine

## 2022-07-24 ENCOUNTER — Ambulatory Visit (INDEPENDENT_AMBULATORY_CARE_PROVIDER_SITE_OTHER): Payer: BC Managed Care – PPO

## 2022-07-24 ENCOUNTER — Ambulatory Visit
Admission: EM | Admit: 2022-07-24 | Discharge: 2022-07-24 | Disposition: A | Discharge status: Home / Self Care | Payer: BC Managed Care – PPO | Attending: Family Medicine | Admitting: Family Medicine

## 2022-07-24 DIAGNOSIS — M25561 Pain in right knee: Secondary | ICD-10-CM

## 2022-07-24 DIAGNOSIS — M1711 Unilateral primary osteoarthritis, right knee: Secondary | ICD-10-CM

## 2022-07-24 HISTORY — DX: Diverticulosis of intestine, part unspecified, without perforation or abscess without bleeding: K57.90

## 2022-07-24 HISTORY — DX: Diaphragmatic hernia without obstruction or gangrene: K44.9

## 2022-07-24 MED ORDER — KETOROLAC TROMETHAMINE 30 MG/ML IJ SOLN
30.0000 mg | Freq: Once | INTRAMUSCULAR | Status: AC
  Administered 2022-07-24: 30 mg via INTRAMUSCULAR

## 2022-07-24 MED ORDER — DICLOFENAC SODIUM 1 % EX GEL
2.0000 g | Freq: Four times a day (QID) | CUTANEOUS | 2 refills | Status: DC
Start: 2022-07-24 — End: 2023-01-09

## 2022-07-24 NOTE — ED Provider Notes (Signed)
[facility]    CSN: [id number] Arrival date & time: 07/24/22  0956      History   Chief Complaint Chief Complaint  Patient presents with   Knee Pain    HPI Capucine Busenbark is a 47 y.o. female.   Patient presenting today with 1 day history of right medial knee pain, stiffness.  Denies any injury but states she has had some increased physical activity recently as she went to the park and did a lot of walking the other day.  She denies swelling, discoloration, numbness, tingling, weakness.  Does have a cam boot on this leg which she has had for months for an Achilles injury, awaiting surgery for this in May.  She has been trying Tylenol with minimal relief, states she is unable to take p.o. NSAIDs due to history of GI bleed.    Past Medical History:  Diagnosis Date   Anxiety    Anxiety    Depression    Depression    Diverticulosis    Encounter for screening for HIV 07/08/2017   Hiatal hernia    Polypharmacy 07/08/2017    Patient Active Problem List   Diagnosis Date Noted   ASCUS of cervix with negative high risk HPV 05/26/2022   Tenderness of female pelvic organs 05/19/2022   Hot flashes 05/19/2022   Abnormal uterine bleeding (AUB) 05/19/2022   Encounter for screening fecal occult blood testing 05/19/2022   Encounter for gynecological examination with Papanicolaou smear of cervix 05/19/2022   Perimenopause 05/19/2022   Facial skin lesion 01/21/2022   Obesity (BMI 30-39.9) 07/14/2021   Anxiety and depression 07/14/2021   Onychomycosis 07/14/2021   Vitamin D deficiency 07/07/2019    Past Surgical History:  Procedure Laterality Date   CESAREAN SECTION     COLONOSCOPY     2017, normal.    ESOPHAGOGASTRODUODENOSCOPY     KNEE SURGERY  1981   TUBAL LIGATION      OB History     Gravida  3   Para  3   Term  3   Preterm      AB      Living  3      SAB      IAB      Ectopic      Multiple      Live Births  3            Home  Medications    Prior to Admission medications   Medication Sig Start Date End Date Taking? Authorizing Provider  diclofenac Sodium (VOLTAREN) 1 % GEL Apply 2 g topically 4 (four) times daily. 07/24/22  Yes Volney American, PA-C  metroNIDAZOLE (FLAGYL) 500 MG tablet Take 1 tablet (500 mg total) by mouth 2 (two) times daily. 05/26/22   Estill Dooms, NP  ARIPiprazole (ABILIFY) 5 MG tablet TAKE 1 TABLET(5 MG) BY MOUTH DAILY 01/21/22   Thersa Salt G, DO  buPROPion (WELLBUTRIN XL) 300 MG 24 hr tablet TAKE 1 TABLET(300 MG) BY MOUTH DAILY 04/28/22   Cook, Jayce G, DO  busPIRone (BUSPAR) 10 MG tablet TAKE 1 TABLET(10 MG) BY MOUTH TWICE DAILY 07/01/22   Coral Spikes, DO  hydrOXYzine (VISTARIL) 25 MG capsule Take 1 capsule (25 mg total) by mouth every 8 (eight) hours as needed for anxiety. 01/21/22   Coral Spikes, DO  Multiple Vitamin (MULTIVITAMIN) tablet Take 1 tablet by mouth daily.    [provider]  pantoprazole (PROTONIX) 20 MG tablet Take  by mouth. 05/01/22   [provider]    Family History Family History  Problem Relation Age of Onset   Colon polyps Mother    Suicidality Father    Hypertension Sister    Colon cancer Maternal Grandmother    Cancer Maternal Grandmother    Migraines Daughter    Drug abuse Other     Social History Social History   Tobacco Use   Smoking status: Never    Passive exposure: Yes   Smokeless tobacco: Never  Vaping Use   Vaping Use: Never used  Substance Use Topics   Alcohol use: Yes    Alcohol/week: 2.0 standard drinks of alcohol    Types: 2 Glasses of wine per week    Comment: very rarely   Drug use: No     Allergies   Demerol [meperidine hcl], Meperidine, Aspirin, and Nsaids   Review of Systems Review of Systems PER HPI  Physical Exam Triage Vital Signs ED Triage Vitals  Enc Vitals Group     BP 07/24/22 1051 134/77     Pulse Rate 07/24/22 1051 76     Resp 07/24/22 1051 20     Temp 07/24/22 1051 98 F  (36.7 C)     Temp Source 07/24/22 1051 Oral     SpO2 07/24/22 1051 99 %     Weight --      Height --      Head Circumference --      Peak Flow --      Pain Score 07/24/22 1048 8     Pain Loc --      Pain Edu? --      Excl. in [place]? --    No data found.  Updated Vital Signs BP 134/77 (BP Location: Right Arm)   Pulse 76   Temp 98 F (36.7 C) (Oral)   Resp 20   LMP 07/24/2022 Comment: pt reports perimenopausal.  SpO2 99%   Visual Acuity Right Eye Distance:   Left Eye Distance:   Bilateral Distance:    Right Eye Near:   Left Eye Near:    Bilateral Near:     Physical Exam Vitals and nursing note reviewed.  Constitutional:      Appearance: Normal appearance. She is not ill-appearing.  HENT:     Head: Atraumatic.  Eyes:     Extraocular Movements: Extraocular movements intact.     Conjunctiva/sclera: Conjunctivae normal.  Cardiovascular:     Rate and Rhythm: Normal rate and regular rhythm.     Heart sounds: Normal heart sounds.  Pulmonary:     Effort: Pulmonary effort is normal.     Breath sounds: Normal breath sounds.  Musculoskeletal:        General: Tenderness present. No swelling or deformity. Normal range of motion.     Cervical back: Normal range of motion and neck supple.     Comments: Mild tenderness to palpation to the medial right anterior knee, no bony deformities palpable, range of motion intact, minimally antalgic gait though does have a cam boot on the right leg for an Achilles injury for which she is awaiting surgery.  No joint laxity, negative McMurray's right lower extremity  Skin:    General: Skin is warm and dry.     Findings: No bruising or erythema.  Neurological:     Mental Status: She is alert and oriented to person, place, and time.     Comments: Right lower extremity neurovascularly intact  Psychiatric:  Mood and Affect: Mood normal.        Thought Content: Thought content normal.        Judgment: Judgment normal.    UC Treatments /  Results  Labs (all labs ordered are listed, but only abnormal results are displayed) Labs Reviewed - No data to display  EKG   Radiology DG Knee Complete 4 Views Right  Result Date: 07/24/2022 CLINICAL DATA:  Right knee pain EXAM: RIGHT KNEE - COMPLETE 4+ VIEW COMPARISON:  None Available. FINDINGS: No evidence of fracture, dislocation, or joint effusion. Mild tricompartmental joint space narrowing with marginal spurring. Soft tissues are unremarkable. IMPRESSION: Mild tricompartmental knee osteoarthritis. No acute fracture or dislocation. Electronically Signed   By: Keane Police D.O.   On: 07/24/2022 11:09    Procedures Procedures (including critical care time)  Medications Ordered in UC Medications  ketorolac (TORADOL) 30 MG/ML injection 30 mg (30 mg Intramuscular Given 07/24/22 1139)    Initial Impression / Assessment and Plan / UC Course  I have reviewed the triage vital signs and the nursing notes.  Pertinent labs & imaging results that were available during my care of the patient were reviewed by me and considered in my medical decision making (see chart for details).     X-ray today showing no acute bony abnormalities, osteoarthritis to the right knee.  Possibly new strain from overactivity as she states she went to the park the other day and did more walking than usual, could also be osteoarthritis flare.  She is intolerant to p.o. NSAIDs so we will give IM Toradol, continue Tylenol and Voltaren gel as needed.  Work note given.  Return for worsening symptoms.  Final Clinical Impressions(s) / UC Diagnoses   Final diagnoses:  Acute pain of right knee  Primary osteoarthritis of right knee   Discharge Instructions   None    ED Prescriptions     Medication Sig Dispense Auth. Provider   diclofenac Sodium (VOLTAREN) 1 % GEL Apply 2 g topically 4 (four) times daily. 150 g Volney American, [place]      PDMP not reviewed this encounter.   Volney American,  [place] 07/24/22 1306

## 2022-07-24 NOTE — ED Triage Notes (Signed)
Pt reports right knee pain since yesterday. Denies any known injury. States had to use walker to assist with gait yesterday as could not bear weight un assisted at times yesterday.  Pt currently wearing boot for "achilles tendinosis" that is scheduled for May.

## 2022-07-29 ENCOUNTER — Ambulatory Visit: Payer: BC Managed Care – PPO | Admitting: Family Medicine

## 2022-07-29 VITALS — Ht 60.0 in | Wt 206.0 lb

## 2022-07-29 DIAGNOSIS — M1711 Unilateral primary osteoarthritis, right knee: Secondary | ICD-10-CM | POA: Diagnosis not present

## 2022-07-29 NOTE — Patient Instructions (Signed)
Topical anti-inflammatory as prescribed.  Tylenol 1000 mg 3 times daily.  Watch diet to promote weight loss.  Take care  Dr. Lacinda Axon

## 2022-07-29 NOTE — Progress Notes (Signed)
Subjective:  Patient ID: Patricia Becker, female    DOB: [birth year]  Age: 47 y.o. MRN: [id number]  CC: Chief Complaint  Patient presents with   ER follow up    R knee pain and ankle     HPI:  47 year old female presents for follow-up regarding recent knee pain.  Patient recently developed right knee pain and went to a local urgent care on 3/15.  X-ray was obtained.  X-ray revealed mild degenerative changes.  I have independently reviewed the film today.  Independent interpretation: Minimal degenerative changes.  No fracture.  Patient states that she is improved from prior.  Still having some difficulty.  She also has ongoing issues with her right ankle as she has peroneal tendinitis.  She is currently in a cam walker.  She states that she is in need of surgery which is upcoming.  She states that she is taking Tylenol and using the diclofenac.  She is not using it as frequently as prescribed due to concerns for side effects.  Patient Active Problem List   Diagnosis Date Noted   Osteoarthritis of right knee 07/29/2022   ASCUS of cervix with negative high risk HPV 05/26/2022   Hot flashes 05/19/2022   Abnormal uterine bleeding (AUB) 05/19/2022   Perimenopause 05/19/2022   Facial skin lesion 01/21/2022   Obesity (BMI 30-39.9) 07/14/2021   Anxiety and depression 07/14/2021   Onychomycosis 07/14/2021   Vitamin D deficiency 07/07/2019    Social Hx   Social History   Socioeconomic History   Marital status: Married    Spouse name: Dwayne   Number of children: 3   Years of education: Not on file   Highest education level: Bachelor's degree (e.g., BA, AB, BS)  Occupational History   Not on file  Tobacco Use   Smoking status: Never    Passive exposure: Yes   Smokeless tobacco: Never  Vaping Use   Vaping Use: Never used  Substance and Sexual Activity   Alcohol use: Yes    Alcohol/week: 2.0 standard drinks of alcohol    Types: 2 Glasses of wine per week    Comment: very rarely    Drug use: No   Sexual activity: Yes    Birth control/protection: Surgical    Comment: tubal  Other Topics Concern   Not on file  Social History Narrative   Moved from [place], [place] in 01/2017. Had lived there for over 20 years. Moved with husband, son, and daughter.       Daughter is 29 y/o-married, expecting and lives in [place]   Daughter is 49 y/o married, has 2 children: 72 and 88 y/o living with her right now   Son is 15 y/o.      Daugther is 40 y/o- husband daughter-step daughter. Lives with her mother now.      Enjoys walking trials, and working out when she could, netflix and chill, and family      Diet: reports she use to meal prep, but has stopped, is supplementing with green powders, meats, fruits   Caffeine: 20 oz 3-4 time a week, no soda, tea- at times mixed sweet and unsweet   Water: 5-8 cups daily      Wears seatbelt.    Wears sunscreen.   Smoke detectors at home.   Does not use phone while driving.   Social Determinants of Health   Financial Resource Strain: Low Risk  (05/19/2022)   Overall Financial Resource Strain (CARDIA)    Difficulty of Paying  Living Expenses: Not hard at all  Food Insecurity: No Food Insecurity (05/19/2022)   Hunger Vital Sign    Worried About Running Out of Food in the Last Year: Never true    Ran Out of Food in the Last Year: Never true  Transportation Needs: No Transportation Needs (05/19/2022)   PRAPARE - Hydrologist (Medical): No    Lack of Transportation (Non-Medical): No  Physical Activity: Insufficiently Active (05/19/2022)   Exercise Vital Sign    Days of Exercise per Week: 3 days    Minutes of Exercise per Session: 30 min  Stress: No Stress Concern Present (05/19/2022)   [place]    Feeling of Stress : Only a little  Social Connections: Moderately Integrated (05/19/2022)   Social Connection and Isolation Panel [NHANES]    Frequency of  Communication with Friends and Family: More than three times a week    Frequency of Social Gatherings with Friends and Family: Once a week    Attends Religious Services: 1 to 4 times per year    Active Member of Clubs or Organizations: No    Attends Archivist Meetings: Never    Marital Status: Married    Review of Systems Per HPI  Objective:  Ht 5' (1.524 m)   Wt 206 lb (93.4 kg)   LMP 07/24/2022 Comment: pt reports perimenopausal.  BMI 40.23 kg/m      07/29/2022    1:34 PM 07/24/2022   10:51 AM 05/19/2022   11:46 AM  BP/Weight  Systolic BP  [id number]  Diastolic BP  77 76  Wt. (Lbs) 206  203  BMI 40.23 kg/m2  39.65 kg/m2    Physical Exam Constitutional:      Appearance: Normal appearance. She is obese.  HENT:     Head: Normocephalic and atraumatic.  Cardiovascular:     Rate and Rhythm: Normal rate and regular rhythm.  Pulmonary:     Effort: Pulmonary effort is normal.     Breath sounds: Normal breath sounds.  Musculoskeletal:     Comments: Right knee with no appreciable effusion.  Neurological:     Mental Status: She is alert.  Psychiatric:        Mood and Affect: Mood normal.        Behavior: Behavior normal.     Lab Results  Component Value Date   WBC 8.7 04/13/2019   HGB 12.5 04/13/2019   HCT 39.7 04/13/2019   PLT 234 04/13/2019   GLUCOSE 86 04/13/2019   CHOL 136 07/08/2017   TRIG 79 07/08/2017   HDL 52 07/08/2017   LDLCALC 68 07/08/2017   ALT 22 04/13/2019   AST 13 (L) 04/13/2019   NA 140 04/13/2019   K 3.9 04/13/2019   CL 109 04/13/2019   CREATININE 0.79 04/13/2019   BUN 16 04/13/2019   CO2 23 04/13/2019   TSH 1.97 04/18/2019     Assessment & Plan:   Problem List Items Addressed This Visit       Musculoskeletal and Integument   Osteoarthritis of right knee - Primary    Advised use of diclofenac as prescribed.  She can use Tylenol 1000 mg 3 times daily as well. Supportive care.      Relevant Medications   acetaminophen  (TYLENOL) 500 MG tablet   Stacee Earp DO [place]

## 2022-07-29 NOTE — Assessment & Plan Note (Signed)
Advised use of diclofenac as prescribed.  She can use Tylenol 1000 mg 3 times daily as well. Supportive care.

## 2022-09-09 DIAGNOSIS — M7661 Achilles tendinitis, right leg: Secondary | ICD-10-CM | POA: Diagnosis not present

## 2022-09-09 DIAGNOSIS — M7671 Peroneal tendinitis, right leg: Secondary | ICD-10-CM | POA: Diagnosis not present

## 2022-09-09 DIAGNOSIS — R262 Difficulty in walking, not elsewhere classified: Secondary | ICD-10-CM | POA: Diagnosis not present

## 2022-09-15 DIAGNOSIS — S86391A Other injury of muscle(s) and tendon(s) of peroneal muscle group at lower leg level, right leg, initial encounter: Secondary | ICD-10-CM | POA: Diagnosis not present

## 2022-09-15 DIAGNOSIS — M216X1 Other acquired deformities of right foot: Secondary | ICD-10-CM | POA: Diagnosis not present

## 2022-09-15 DIAGNOSIS — S86311A Strain of muscle(s) and tendon(s) of peroneal muscle group at lower leg level, right leg, initial encounter: Secondary | ICD-10-CM | POA: Diagnosis not present

## 2022-09-15 DIAGNOSIS — M7661 Achilles tendinitis, right leg: Secondary | ICD-10-CM | POA: Diagnosis not present

## 2022-09-17 DIAGNOSIS — M67873 Other specified disorders of tendon, right ankle and foot: Secondary | ICD-10-CM | POA: Diagnosis not present

## 2022-09-17 DIAGNOSIS — M7731 Calcaneal spur, right foot: Secondary | ICD-10-CM | POA: Diagnosis not present

## 2022-09-17 DIAGNOSIS — S86391A Other injury of muscle(s) and tendon(s) of peroneal muscle group at lower leg level, right leg, initial encounter: Secondary | ICD-10-CM | POA: Diagnosis not present

## 2022-09-17 DIAGNOSIS — Y999 Unspecified external cause status: Secondary | ICD-10-CM | POA: Diagnosis not present

## 2022-09-17 DIAGNOSIS — M7661 Achilles tendinitis, right leg: Secondary | ICD-10-CM | POA: Diagnosis not present

## 2022-09-17 DIAGNOSIS — X58XXXA Exposure to other specified factors, initial encounter: Secondary | ICD-10-CM | POA: Diagnosis not present

## 2022-09-17 HISTORY — PX: ANKLE SURGERY: SHX546

## 2022-10-02 DIAGNOSIS — M216X1 Other acquired deformities of right foot: Secondary | ICD-10-CM | POA: Diagnosis not present

## 2022-10-08 ENCOUNTER — Ambulatory Visit: Payer: BC Managed Care – PPO | Admitting: Family Medicine

## 2022-10-08 VITALS — BP 103/68 | HR 84 | Temp 98.4°F | Ht 60.0 in

## 2022-10-08 DIAGNOSIS — G5710 Meralgia paresthetica, unspecified lower limb: Secondary | ICD-10-CM | POA: Diagnosis not present

## 2022-10-08 NOTE — Patient Instructions (Signed)
Heat may help.  Use the pain medication if needed.  If continues to persist, let me know and I'll send something in for you.

## 2022-10-09 DIAGNOSIS — G5710 Meralgia paresthetica, unspecified lower limb: Secondary | ICD-10-CM | POA: Insufficient documentation

## 2022-10-09 NOTE — Assessment & Plan Note (Signed)
I suspect patient is experiencing meralgia paresthetica due to her obesity and positioning.  Patient has pain medication at home.  I advised her that she can use this as needed.  This is likely going to self resolve.  Supportive care.

## 2022-10-09 NOTE — Progress Notes (Signed)
Subjective:  Patient ID: Patricia Becker, female    DOB: [birth year]  Age: 47 y.o. MRN: [id number]  CC: Chief Complaint  Patient presents with   pain in right and left thigh recent R ankle surgery    Tendon repair 09/17/22    HPI:  47 year old female presents for evaluation of the above  Patient recently had right Achilles and peroneal tendon repair.  She states that she has recently been experiencing pain and numbness/paresthesia of the lateral thighs.  Patient concerned about this thus prompting her visit today.  She is nonweightbearing at this time.  She has been doing a lot of sitting.  She has been doing a lot of laying as well.  She has been elevating her legs and has also been elevating her upper half with the elevation of her legs.  Patient Active Problem List   Diagnosis Date Noted   Meralgia paraesthetica 10/09/2022   Osteoarthritis of right knee 07/29/2022   ASCUS of cervix with negative high risk HPV 05/26/2022   Hot flashes 05/19/2022   Abnormal uterine bleeding (AUB) 05/19/2022   Perimenopause 05/19/2022   Facial skin lesion 01/21/2022   Obesity (BMI 30-39.9) 07/14/2021   Anxiety and depression 07/14/2021   Onychomycosis 07/14/2021   Vitamin D deficiency 07/07/2019    Social Hx   Social History   Socioeconomic History   Marital status: Married    Spouse name: Dwayne   Number of children: 3   Years of education: Not on file   Highest education level: Associate degree: academic program  Occupational History   Not on file  Tobacco Use   Smoking status: Never    Passive exposure: Yes   Smokeless tobacco: Never  Vaping Use   Vaping Use: Never used  Substance and Sexual Activity   Alcohol use: Yes    Alcohol/week: 2.0 standard drinks of alcohol    Types: 2 Glasses of wine per week    Comment: very rarely   Drug use: No   Sexual activity: Yes    Birth control/protection: Surgical    Comment: tubal  Other Topics Concern   Not on file  Social History  Narrative   Moved from [place], [place] in 01/2017. Had lived there for over 20 years. Moved with husband, son, and daughter.       Daughter is 34 y/o-married, expecting and lives in [place]   Daughter is 58 y/o married, has 2 children: 3 and 1 y/o living with her right now   Son is 32 y/o.      Daugther is 50 y/o- husband daughter-step daughter. Lives with her mother now.      Enjoys walking trials, and working out when she could, netflix and chill, and family      Diet: reports she use to meal prep, but has stopped, is supplementing with green powders, meats, fruits   Caffeine: 20 oz 3-4 time a week, no soda, tea- at times mixed sweet and unsweet   Water: 5-8 cups daily      Wears seatbelt.    Wears sunscreen.   Smoke detectors at home.   Does not use phone while driving.   Social Determinants of Health   Financial Resource Strain: Low Risk  (10/06/2022)   Overall Financial Resource Strain (CARDIA)    Difficulty of Paying Living Expenses: Not very hard  Food Insecurity: No Food Insecurity (10/06/2022)   Hunger Vital Sign    Worried About Running Out of Food in the Last Year: Never  true    Ran Out of Food in the Last Year: Never true  Transportation Needs: No Transportation Needs (10/06/2022)   PRAPARE - Administrator, Civil Service (Medical): No    Lack of Transportation (Non-Medical): No  Physical Activity: Inactive (10/06/2022)   Exercise Vital Sign    Days of Exercise per Week: 0 days    Minutes of Exercise per Session: 30 min  Stress: Stress Concern Present (10/06/2022)   [employer] of Occupational Health - Occupational Stress Questionnaire    Feeling of Stress : To some extent  Social Connections: Moderately Isolated (10/06/2022)   Social Connection and Isolation Panel [NHANES]    Frequency of Communication with Friends and Family: Twice a week    Frequency of Social Gatherings with Friends and Family: Once a week    Attends Religious Services: Never     Database administrator or Organizations: No    Attends Engineer, structural: Never    Marital Status: Married    Review of Systems Per HPI  Objective:  BP 103/68   Pulse 84   Temp 98.4 F (36.9 C)   Ht 5' (1.524 m)   SpO2 97%   BMI 40.23 kg/m      10/08/2022    1:58 PM 07/29/2022    1:34 PM 07/24/2022   10:51 AM  BP/Weight  Systolic BP 103  161  Diastolic BP 68  77  Wt. (Lbs)  206   BMI  40.23 kg/m2     Physical Exam Constitutional:      Appearance: Normal appearance. She is obese.  HENT:     Head: Normocephalic and atraumatic.  Pulmonary:     Effort: Pulmonary effort is normal. No respiratory distress.  Musculoskeletal:     Comments: No proximal thigh tenderness to palpation.  Neurological:     Mental Status: She is alert.  Psychiatric:        Mood and Affect: Mood normal.        Behavior: Behavior normal.     Lab Results  Component Value Date   WBC 8.7 04/13/2019   HGB 12.5 04/13/2019   HCT 39.7 04/13/2019   PLT 234 04/13/2019   GLUCOSE 86 04/13/2019   CHOL 136 07/08/2017   TRIG 79 07/08/2017   HDL 52 07/08/2017   LDLCALC 68 07/08/2017   ALT 22 04/13/2019   AST 13 (L) 04/13/2019   NA 140 04/13/2019   K 3.9 04/13/2019   CL 109 04/13/2019   CREATININE 0.79 04/13/2019   BUN 16 04/13/2019   CO2 23 04/13/2019   TSH 1.97 04/18/2019     Assessment & Plan:   Problem List Items Addressed This Visit       Nervous and Auditory   Meralgia paraesthetica - Primary    I suspect patient is experiencing meralgia paresthetica due to her obesity and positioning.  Patient has pain medication at home.  I advised her that she can use this as needed.  This is likely going to self resolve.  Supportive care.      Relevant Medications   cyclobenzaprine (FLEXERIL) 5 MG tablet    Yulian Gosney DO [facility]

## 2022-10-10 DIAGNOSIS — I82409 Acute embolism and thrombosis of unspecified deep veins of unspecified lower extremity: Secondary | ICD-10-CM

## 2022-10-10 HISTORY — DX: Acute embolism and thrombosis of unspecified deep veins of unspecified lower extremity: I82.409

## 2022-10-14 DIAGNOSIS — M216X1 Other acquired deformities of right foot: Secondary | ICD-10-CM | POA: Diagnosis not present

## 2022-10-21 ENCOUNTER — Encounter: Payer: Self-pay | Admitting: Family Medicine

## 2022-10-29 ENCOUNTER — Inpatient Hospital Stay (HOSPITAL_COMMUNITY): Admission: RE | Admit: 2022-10-29 | Payer: BC Managed Care – PPO | Source: Ambulatory Visit

## 2022-11-02 ENCOUNTER — Ambulatory Visit (HOSPITAL_COMMUNITY)
Admission: RE | Admit: 2022-11-02 | Discharge: 2022-11-02 | Disposition: A | Discharge status: Home / Self Care | Payer: BC Managed Care – PPO | Source: Ambulatory Visit | Attending: Surgery | Admitting: Surgery

## 2022-11-02 ENCOUNTER — Other Ambulatory Visit (HOSPITAL_COMMUNITY): Payer: Self-pay

## 2022-11-02 ENCOUNTER — Other Ambulatory Visit (HOSPITAL_COMMUNITY): Payer: Self-pay | Admitting: Orthopaedic Surgery

## 2022-11-02 ENCOUNTER — Ambulatory Visit (HOSPITAL_BASED_OUTPATIENT_CLINIC_OR_DEPARTMENT_OTHER)
Admission: RE | Admit: 2022-11-02 | Discharge: 2022-11-02 | Disposition: A | Discharge status: Home / Self Care | Payer: BC Managed Care – PPO | Source: Ambulatory Visit | Attending: Vascular Surgery | Admitting: Vascular Surgery

## 2022-11-02 ENCOUNTER — Encounter (HOSPITAL_COMMUNITY): Payer: Self-pay

## 2022-11-02 VITALS — BP 121/78 | HR 88

## 2022-11-02 DIAGNOSIS — I82441 Acute embolism and thrombosis of right tibial vein: Secondary | ICD-10-CM | POA: Insufficient documentation

## 2022-11-02 DIAGNOSIS — R609 Edema, unspecified: Secondary | ICD-10-CM | POA: Insufficient documentation

## 2022-11-02 MED ORDER — XARELTO VTE STARTER PACK 15 & 20 MG PO TBPK
ORAL_TABLET | ORAL | 0 refills | Status: DC
  Filled 2022-11-02: qty 51, 28d supply, fill #0

## 2022-11-02 MED ORDER — RIVAROXABAN 20 MG PO TABS
20.0000 mg | ORAL_TABLET | Freq: Every day | ORAL | 1 refills | Status: DC
  Filled 2022-11-02: qty 30, 30d supply, fill #0

## 2022-11-02 MED ORDER — RIVAROXABAN 20 MG PO TABS: 20.0000 mg | ORAL_TABLET | Freq: Every day | ORAL | 1 refills | Status: DC

## 2022-11-02 NOTE — Progress Notes (Cosign Needed)
DVT Clinic Note  Name: Patricia Becker     MRN: [id number]     DOB: [birth year]     Sex: female  PCP: Tommie Sams, DO  Today's Visit: Visit Information: Initial Visit  Referred to DVT Clinic by: Dr. Susa Simmonds ([facility])  Referred to CPP by: Dr. Karin Lieu Reason for referral:  Chief Complaint  Patient presents with   DVT   HISTORY OF PRESENT ILLNESS: Patricia Becker is a 47 y.o. female with PMH anxiety and depression who presents after diagnosis of DVT for medication management. She is s/p R achilles and peroneal tendon surgery on 09/17/22 and developed RLE pain, swelling, and numbness. Dopper today showed acute DVT involving the right peroneal and posterior tibial veins. She was started on aspirin 81 mg BID for 30 days after the surgery which she completed. She began to develop right calf pain and swelling about 1 week ago. This has started to improve with most of the swelling remaining in her foot. She has also been experiencing some numbness and tingling on her left side up to her thigh. Her right leg is in a boot today and she is walking with a walker and accompanied by her son.    Positive Thrombotic Risk Factors: Obesity, Recent surgery (within 3 months), Bed rest >72 hours within 3 month Bleeding Risk Factors: None Present  Negative Thrombotic Risk Factors: Previous VTE, Smoking, Older age, Known thrombophilic condition, Non-malignant, chronic inflammatory condition, Pregnancy, Testosterone therapy, Estrogen therapy, Active cancer, Recent COVID diagnosis (within 3 months), Recent cesarean section (within 3 months), Sedentary journey lasting >8 hours within 4 weeks, Erythropoiesis-stimulating agent, Within 6 weeks postpartum, Central venous catheterization, Paralysis, paresis, or recent plaster cast immobilization of lower extremity, Recent trauma (within 3 months), Recent admission to hospital with acute illness (within 3 months)  Rx Insurance Coverage: Commercial Rx Affordability: Xarelto  and Eliquis are both $193/month. Filled today using one time $0 savings card. Provided patient with copay card to reduce cost of refills to $10/month.  Preferred Pharmacy: Filled today at [facility] during the visit. Refills sent to patient's preferred [facility].   Past Medical History:  Diagnosis Date   Anxiety    Anxiety    Depression    Depression    Diverticulosis    Encounter for screening for HIV 07/08/2017   Hiatal hernia    Polypharmacy 07/08/2017    Past Surgical History:  Procedure Laterality Date   CESAREAN SECTION     COLONOSCOPY     2017, normal.    ESOPHAGOGASTRODUODENOSCOPY     KNEE SURGERY  1981   TUBAL LIGATION      Social History   Socioeconomic History   Marital status: Married    Spouse name: Dwayne   Number of children: 3   Years of education: Not on file   Highest education level: Associate degree: academic program  Occupational History   Not on file  Tobacco Use   Smoking status: Never    Passive exposure: Yes   Smokeless tobacco: Never  Vaping Use   Vaping Use: Never used  Substance and Sexual Activity   Alcohol use: Yes    Alcohol/week: 2.0 standard drinks of alcohol    Types: 2 Glasses of wine per week    Comment: very rarely   Drug use: No   Sexual activity: Yes    Birth control/protection: Surgical    Comment: tubal  Other Topics Concern   Not on file  Social History Narrative  Moved from [place], [place] in 01/2017. Had lived there for over 20 years. Moved with husband, son, and daughter.       Daughter is 32 y/o-married, expecting and lives in [place]   Daughter is 73 y/o married, has 2 children: 3 and 1 y/o living with her right now   Son is 21 y/o.      Daugther is 59 y/o- husband daughter-step daughter. Lives with her mother now.      Enjoys walking trials, and working out when she could, netflix and chill, and family      Diet: reports she use to meal prep, but has stopped, is supplementing with green powders,  meats, fruits   Caffeine: 20 oz 3-4 time a week, no soda, tea- at times mixed sweet and unsweet   Water: 5-8 cups daily      Wears seatbelt.    Wears sunscreen.   Smoke detectors at home.   Does not use phone while driving.   Social Determinants of Health   Financial Resource Strain: Low Risk  (10/06/2022)   Overall Financial Resource Strain (CARDIA)    Difficulty of Paying Living Expenses: Not very hard  Food Insecurity: No Food Insecurity (10/06/2022)   Hunger Vital Sign    Worried About Running Out of Food in the Last Year: Never true    Ran Out of Food in the Last Year: Never true  Transportation Needs: No Transportation Needs (10/06/2022)   PRAPARE - Administrator, Civil Service (Medical): No    Lack of Transportation (Non-Medical): No  Physical Activity: Inactive (10/06/2022)   Exercise Vital Sign    Days of Exercise per Week: 0 days    Minutes of Exercise per Session: 30 min  Stress: Stress Concern Present (10/06/2022)   [employer] of Occupational Health - Occupational Stress Questionnaire    Feeling of Stress : To some extent  Social Connections: Moderately Isolated (10/06/2022)   Social Connection and Isolation Panel [NHANES]    Frequency of Communication with Friends and Family: Twice a week    Frequency of Social Gatherings with Friends and Family: Once a week    Attends Religious Services: Never    Database administrator or Organizations: No    Attends Banker Meetings: Never    Marital Status: Married  Catering manager Violence: Not At Risk (05/19/2022)   Humiliation, Afraid, Rape, and Kick questionnaire    Fear of Current or Ex-Partner: No    Emotionally Abused: No    Physically Abused: No    Sexually Abused: No    Family History  Problem Relation Age of Onset   Colon polyps Mother    Suicidality Father    Hypertension Sister    Colon cancer Maternal Grandmother    Cancer Maternal Grandmother    Migraines Daughter    Drug abuse  Other     Allergies as of 11/02/2022 - Review Complete 11/02/2022  Allergen Reaction Noted   Demerol [meperidine hcl] Anaphylaxis 07/08/2017   Meperidine Anaphylaxis 05/30/2019   Aspirin Other (See Comments) 07/08/2017   Nsaids  07/24/2022    Current Outpatient Medications on File Prior to Encounter  Medication Sig Dispense Refill   acetaminophen (TYLENOL) 500 MG tablet Take by mouth.     ARIPiprazole (ABILIFY) 5 MG tablet TAKE 1 TABLET(5 MG) BY MOUTH DAILY 90 tablet 3   buPROPion (WELLBUTRIN XL) 300 MG 24 hr tablet TAKE 1 TABLET(300 MG) BY MOUTH DAILY 90 tablet 0  busPIRone (BUSPAR) 10 MG tablet TAKE 1 TABLET(10 MG) BY MOUTH TWICE DAILY (Patient taking differently: Take 10 mg by mouth daily.) 180 tablet 0   gabapentin (NEURONTIN) 300 MG capsule Take 300 mg by mouth at bedtime.     hydrOXYzine (VISTARIL) 25 MG capsule Take 1 capsule (25 mg total) by mouth every 8 (eight) hours as needed for anxiety. 90 capsule 2   Melatonin 10 MG TABS Take 10 mg by mouth at bedtime as needed (sleep).     Multiple Vitamin (MULTIVITAMIN) tablet Take 1 tablet by mouth daily.     ondansetron (ZOFRAN) 4 MG tablet Take 4 mg by mouth every 6 (six) hours as needed for nausea or vomiting.     oxyCODONE (OXY IR/ROXICODONE) 5 MG immediate release tablet      pantoprazole (PROTONIX) 20 MG tablet Take by mouth.     diclofenac Sodium (VOLTAREN) 1 % GEL Apply 2 g topically 4 (four) times daily. (Patient not taking: Reported on 11/02/2022) 150 g 2   No current facility-administered medications on file prior to encounter.   REVIEW OF SYSTEMS:  Review of Systems  Respiratory:  Negative for shortness of breath.   Cardiovascular:  Positive for leg swelling. Negative for chest pain and palpitations.  Musculoskeletal:  Positive for myalgias.  Neurological:  Positive for tingling. Negative for dizziness.   PHYSICAL EXAMINATION:  Vitals:   11/02/22 1113  BP: 121/78  Pulse: 88  SpO2: 99%   Physical Exam Vitals  reviewed.  Cardiovascular:     Rate and Rhythm: Normal rate.  Pulmonary:     Effort: Pulmonary effort is normal.  Musculoskeletal:        General: Tenderness present.     Right lower leg: Edema (1+) present.     Left lower leg: No edema.  Skin:    Findings: No bruising or erythema.  Psychiatric:        Mood and Affect: Mood normal.        Behavior: Behavior normal.        Thought Content: Thought content normal.   Villalta Score for Post-Thrombotic Syndrome: Pain: Mild Cramps: Mild Heaviness: Mild Paresthesia: Mild Pruritus: Mild Pretibial Edema: Mild Skin Induration: Absent Hyperpigmentation: Absent Redness: Absent Venous Ectasia: Absent Pain on calf compression: Mild Villalta Preliminary Score: 7 Is venous ulcer present?: No If venous ulcer is present and score is <15, then 15 points total are assigned: Absent Villalta Total Score: 7  LABS:  CBC     Component Value Date/Time   WBC 8.7 04/13/2019 2332   RBC 4.27 04/13/2019 2332   HGB 12.5 04/13/2019 2332   HCT 39.7 04/13/2019 2332   PLT 234 04/13/2019 2332   MCV 93.0 04/13/2019 2332   MCH 29.3 04/13/2019 2332   MCHC 31.5 04/13/2019 2332   RDW 13.7 04/13/2019 2332   LYMPHSABS 2.1 04/13/2019 2332   MONOABS 0.8 04/13/2019 2332   EOSABS 0.2 04/13/2019 2332   BASOSABS 0.1 04/13/2019 2332    Hepatic Function      Component Value Date/Time   PROT 6.3 (L) 04/13/2019 2332   ALBUMIN 3.6 04/13/2019 2332   AST 13 (L) 04/13/2019 2332   ALT 22 04/13/2019 2332   ALKPHOS 69 04/13/2019 2332   BILITOT 0.4 04/13/2019 2332   BILIDIR 0.1 07/08/2017 1010   IBILI 0.4 07/08/2017 1010    Renal Function   Lab Results  Component Value Date   CREATININE 0.79 04/13/2019   CREATININE 1.00 07/08/2017    CrCl cannot be  calculated (Patient's most recent lab result is older than the maximum 21 days allowed.).   No concerns on labs in CareEverywhere from 04/24/22: -Scr 0.84, eGFR 87, LFTs wnl, Hgb 13.6, plt 293K  VVS Vascular  Lab Studies:  11/02/22 VAS [place] LOWER EXTREMITY VENOUS (DVT)RIGHT  Summary:  RIGHT:  - Findings consistent with acute deep vein thrombosis involving the right  posterior tibial veins, and right peroneal veins.  - No cystic structure found in the popliteal fossa.    LEFT:  - No evidence of common femoral vein obstruction.   ASSESSMENT: Location of DVT: Right distal vein Cause of DVT: provoked by a transient risk factor - s/p R achilles and peroneal tendon surgery on 09/17/22. Developed RLE pain and swelling 1 week ago. Will plan to treat provoked DVT with 3 months of anticoagulation. Discussed options of Eliquis and Xarelto. She prefers Xarelto since after 3 weeks it'll only be taken once a day. Renal function is appropriate for Xarelto. We discussed the importance of taking it with food.   PLAN: -Start rivaroxaban (Xarelto) 15 mg twice daily with food for 21 days followed by 20 mg daily with food. -Expected duration of therapy: 3 months. Therapy started on 11/02/22. -Patient educated on purpose, proper use and potential adverse effects of rivaroxaban (Xarelto). -Discussed importance of taking medication around the same time every day. -Advised patient of medications to avoid (NSAIDs, aspirin doses >100 mg daily). -Educated that Tylenol (acetaminophen) is the preferred analgesic to lower the risk of bleeding. -Advised patient to alert all providers of anticoagulation therapy prior to starting a new medication or having a procedure. -Emphasized importance of monitoring for signs and symptoms of bleeding (abnormal bruising, prolonged bleeding, nose bleeds, bleeding from gums, discolored urine, black tarry stools). -Educated patient to present to the ED if emergent signs and symptoms of new thrombosis occur. -Counseled patient to wear compression stockings daily, removing at night. Encouraged elevation as well particularly while her leg is still in a boot.  -Provided refills to last 3 months and  copay card to help reduce refill cost.   Follow up: 1 month in DVT Clinic  Pervis Hocking, PharmD, [place], CPP Deep Vein Thrombosis Clinic Clinical Pharmacist Practitioner Office: [phone]  I have evaluated the patient's chart/imaging and refer this patient to the Clinical Pharmacist Practitioner for medication management. I have reviewed the CPP's documentation and agree with her assessment and plan. I was immediately available during the visit for questions and collaboration.   Victorino Sparrow, MD

## 2022-11-02 NOTE — Patient Instructions (Addendum)
-  Start rivaroxaban (Xarelto) 15 mg twice daily with food for 21 days followed by 20 mg daily with food.  -Your refills have been sent to your [facility]. You may need to call the pharmacy to ask them to fill this when you start to run low on your current supply.  -It is important to take your medication around the same time every day.  -Avoid NSAIDs like ibuprofen (Advil, Motrin) and naproxen (Aleve) as well as aspirin doses over 100 mg daily. -Tylenol (acetaminophen) is the preferred over the counter pain medication to lower the risk of bleeding. -Be sure to alert all of your health care providers that you are taking an anticoagulant prior to starting a new medication or having a procedure. -Monitor for signs and symptoms of bleeding (abnormal bruising, prolonged bleeding, nose bleeds, bleeding from gums, discolored urine, black tarry stools). If you have fallen and hit your head OR if your bleeding is severe or not stopping, seek emergency care.  -Go to the emergency room if emergent signs and symptoms of new clot occur (new or worse swelling and pain in an arm or leg, shortness of breath, chest pain, fast or irregular heartbeats, lightheadedness, dizziness, fainting, coughing up blood) or if you experience a significant color change (pale or blue) in the extremity that has the DVT.  -We recommend you wear compression stockings (20-30 mmHg) as long as you are having swelling or pain. Be sure to purchase the correct size and take them off at night.   Your next visit is on Thursday August 25th at 2pm.  [facility] [address] [place], [place], [place] [address] Enter the hospital through Entrance C off [facility] and pull up to the [facility] V[facility] Center entrance to the free valet parking.  Check in for your appointment at the [facility].   If you have any questions or need to reschedule an appointment, please call [phone] [facility].  If you are having an  emergency, call 911 or present to the nearest emergency room.   What is a DVT?  -Deep vein thrombosis (DVT) is a condition in which a blood clot forms in a vein of the deep venous system which can occur in the lower leg, thigh, pelvis, arm, or neck. This condition is serious and can be life-threatening if the clot travels to the arteries of the lungs and causing a blockage (pulmonary embolism, PE). A DVT can also damage veins in the leg, which can lead to long-term venous disease, leg pain, swelling, discoloration, and ulcers or sores (post-thrombotic syndrome).  -Treatment may include taking an anticoagulant medication to prevent more clots from forming and the current clot from growing, wearing compression stockings, and/or surgical procedures to remove or dissolve the clot.

## 2022-11-04 ENCOUNTER — Encounter (HOSPITAL_COMMUNITY): Payer: Self-pay

## 2022-11-04 ENCOUNTER — Other Ambulatory Visit: Payer: Self-pay

## 2022-11-04 ENCOUNTER — Emergency Department (HOSPITAL_COMMUNITY)
Admission: EM | Admit: 2022-11-04 | Discharge: 2022-11-04 | Disposition: A | Discharge status: Home / Self Care | Payer: BC Managed Care – PPO | Attending: Emergency Medicine | Admitting: Emergency Medicine

## 2022-11-04 ENCOUNTER — Emergency Department (HOSPITAL_COMMUNITY): Payer: BC Managed Care – PPO

## 2022-11-04 DIAGNOSIS — M25551 Pain in right hip: Secondary | ICD-10-CM | POA: Diagnosis not present

## 2022-11-04 DIAGNOSIS — M25552 Pain in left hip: Secondary | ICD-10-CM | POA: Insufficient documentation

## 2022-11-04 DIAGNOSIS — Z7901 Long term (current) use of anticoagulants: Secondary | ICD-10-CM | POA: Diagnosis not present

## 2022-11-04 LAB — PREGNANCY, URINE: Preg Test, Ur: NEGATIVE

## 2022-11-04 NOTE — ED Triage Notes (Addendum)
Pt presents with bilateral hip pain and stiffness that has gotten worse over the last few does. 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 Pt had surgery on right leg at the beginning of may. Pt states that she can not open her legs very far and that her mobility is more limited.

## 2022-11-04 NOTE — ED Provider Notes (Signed)
[place] EMERGENCY DEPARTMENT AT [facility]  Provider Note  CSN: [id number] Arrival date & time: 11/04/22 0114  History Chief Complaint  Patient presents with   Hip Pain    Patricia Becker is a 47 y.o. female presents for evaluation of bilateral hip stiffness. She reports she had R achilles and peroneal tendon surgeries in the beginning of May. She has been in a walking boot since then, has not started PT yet. She was also diagnosed with distal DVT in RLE 2 days ago, started on Xarelto. She reports for the last several weeks, she has been having some aching pain and tingling in her L leg, possibly meralgia paresthetica vs lumbar radiculopathy, in the process of being evaluated by PCP. Her main reason for coming to the ED tonight is that she feels like her hip joints are more stiff bilaterally and less ROM. She has some pain with adducting both, but left seems to be worse than the right. She was advised by her daughter to come to the ED to ensure this is not related to her DVT. She has not had any new or increased swelling in either leg. No CP or SOB. Compliant with Xarelto.    Home Medications Prior to Admission medications   Medication Sig Start Date End Date Taking? Authorizing Provider  acetaminophen (TYLENOL) 500 MG tablet Take by mouth. 05/30/22   [provider]  ARIPiprazole (ABILIFY) 5 MG tablet TAKE 1 TABLET(5 MG) BY MOUTH DAILY 01/21/22   Cook, Jayce G, DO  buPROPion (WELLBUTRIN XL) 300 MG 24 hr tablet TAKE 1 TABLET(300 MG) BY MOUTH DAILY 04/28/22   Cook, Jayce G, DO  busPIRone (BUSPAR) 10 MG tablet TAKE 1 TABLET(10 MG) BY MOUTH TWICE DAILY Patient taking differently: Take 10 mg by mouth daily. 07/01/22   Tommie Sams, DO  diclofenac Sodium (VOLTAREN) 1 % GEL Apply 2 g topically 4 (four) times daily. Patient not taking: Reported on 11/02/2022 07/24/22   Particia Nearing, PA-C  gabapentin (NEURONTIN) 300 MG capsule Take 300 mg by mouth at bedtime. 10/27/22    [provider]  hydrOXYzine (VISTARIL) 25 MG capsule Take 1 capsule (25 mg total) by mouth every 8 (eight) hours as needed for anxiety. 01/21/22   Tommie Sams, DO  Melatonin 10 MG TABS Take 10 mg by mouth at bedtime as needed (sleep).    [provider]  Multiple Vitamin (MULTIVITAMIN) tablet Take 1 tablet by mouth daily.    [provider]  ondansetron (ZOFRAN) 4 MG tablet Take 4 mg by mouth every 6 (six) hours as needed for nausea or vomiting. 09/18/22   [provider]  oxyCODONE (OXY IR/ROXICODONE) 5 MG immediate release tablet  09/17/22   [provider]  pantoprazole (PROTONIX) 20 MG tablet Take by mouth. 05/01/22   [provider]  rivaroxaban (XARELTO) 20 MG TABS tablet Take 1 tablet (20 mg total) by mouth daily with supper. Take with food. Start taking after completion of starter pack. 11/02/22   Pervis Hocking B, RPH-CPP  Rivaroxaban Starter Pack, 15 mg and 20 mg, (XARELTO STARTER PACK) Follow package directions: Take one 15mg  tablet by mouth twice a day. On day 22, switch to one 20mg  tablet once a day. Take with food. 11/02/22   Pervis Hocking B, RPH-CPP     Allergies    Demerol [meperidine hcl], Meperidine, Aspirin, and Nsaids   Review of Systems   Review of Systems Please see HPI for pertinent positives and  negatives  Physical Exam BP (!) 163/79   Pulse 91   Temp 97.7 F (36.5 C)   Resp 18   Wt 95.3 kg   SpO2 99%   BMI 41.01 kg/m   Physical Exam Vitals and nursing note reviewed.  Constitutional:      Appearance: Normal appearance.  HENT:     Head: Normocephalic and atraumatic.     Nose: Nose normal.     Mouth/Throat:     Mouth: Mucous membranes are moist.  Eyes:     Extraocular Movements: Extraocular movements intact.     Conjunctiva/sclera: Conjunctivae normal.  Cardiovascular:     Rate and Rhythm: Normal rate.  Pulmonary:     Effort: Pulmonary effort is normal.     Breath sounds: Normal breath sounds.   Abdominal:     General: Abdomen is flat.     Palpations: Abdomen is soft.     Tenderness: There is no abdominal tenderness.  Musculoskeletal:        General: No swelling, tenderness, deformity or signs of injury. Normal range of motion.     Cervical back: Neck supple.     Right lower leg: No edema.     Left lower leg: No edema.     Comments: R leg in cam boot  Skin:    General: Skin is warm and dry.  Neurological:     General: No focal deficit present.     Mental Status: She is alert.  Psychiatric:        Mood and Affect: Mood normal.     ED Results / Procedures / Treatments   EKG None  Procedures Procedures  Medications Ordered in the ED Medications - No data to display  Initial Impression and Plan  Patient here with MSK hip pain, some numbness in L leg could be radicular. Her doctor has recommended she get lumbar spine films, but those have not been done yet. Will check xrays here. No concern this is related to small distal DVT diagnosed earlier in the week.   ED Course   Clinical Course as of 11/04/22 0343  Wed Nov 04, 2022  1610 I personally viewed the images from radiology studies and agree with radiologist interpretation: Xrays are normal. Patient reassured no concerning findings. Recommend she follow up with Ortho for further management of her post-op course.   [CS]    Clinical Course User Index [CS] Pollyann Savoy, MD     MDM Rules/Calculators/A&P Medical Decision Making Amount and/or Complexity of Data Reviewed Labs: ordered. Decision-making details documented in ED Course. Radiology: ordered and independent interpretation performed. Decision-making details documented in ED Course.     Final Clinical Impression(s) / ED Diagnoses Final diagnoses:  Bilateral hip pain    Rx / DC Orders ED Discharge Orders     None        Pollyann Savoy, MD 11/04/22 [phone]

## 2022-11-05 ENCOUNTER — Telehealth: Payer: Self-pay

## 2022-11-05 NOTE — Transitions of Care (Post Inpatient/ED Visit) (Signed)
   11/05/2022  Name: Patricia Becker MRN: [id number] DOB: May 25, 1975  Today's TOC FU Call Status: Today's TOC FU Call Status:: Unsuccessul Call (1st Attempt) Unsuccessful Call (1st Attempt) Date: 11/05/22  Attempted to reach the patient regarding the most recent Inpatient/ED visit.  Follow Up Plan: Additional outreach attempts will be made to reach the patient to complete the Transitions of Care (Post Inpatient/ED visit) call.   Signature  Karena Addison, LPN [facility] Nurse Health Advisor Direct Dial [phone]

## 2022-11-09 NOTE — Transitions of Care (Post Inpatient/ED Visit) (Signed)
11/09/2022  Name: Margy Willitts MRN: [id number] DOB: 1975-12-22  Today's TOC FU Call Status: Today's TOC FU Call Status:: Successful TOC FU Call Competed Unsuccessful Call (1st Attempt) Date: 11/05/22 [facility] FU Call Complete Date: 11/09/22  Transition Care Management Follow-up Telephone Call Date of Discharge: 11/04/22 [facility] Type of Discharge: Emergency Department Reason for ED Visit: Other: (right hip pain) How have you been since you were released from the hospital?: Better Any questions or concerns?: No  Items Reviewed: Did you receive and understand the discharge instructions provided?: No Medications obtained,verified, and reconciled?: Yes (Medications Reviewed) Any new allergies since your discharge?: No Dietary orders reviewed?: Yes Do you have support at home?: Yes People in Home: spouse  Medications Reviewed Today: Medications Reviewed Today     Reviewed by Karena Addison, LPN (Licensed Practical Nurse) on 11/09/22 at 1711  Med List Status: <None>   Medication Order Taking? Sig Documenting Provider Last Dose Status Informant  acetaminophen (TYLENOL) 500 MG tablet [id number] No Take by mouth. [provider] Taking Active   ARIPiprazole (ABILIFY) 5 MG tablet [id number] No TAKE 1 TABLET(5 MG) BY MOUTH DAILY Tommie Sams, DO Taking Active   buPROPion (WELLBUTRIN XL) 300 MG 24 hr tablet [id number] No TAKE 1 TABLET(300 MG) BY MOUTH DAILY Adriana Simas, Jayce G, DO Taking Active   busPIRone (BUSPAR) 10 MG tablet [id number] No TAKE 1 TABLET(10 MG) BY MOUTH TWICE DAILY  Patient taking differently: Take 10 mg by mouth daily.   Tommie Sams, DO Taking Active   diclofenac Sodium (VOLTAREN) 1 % GEL [id number] No Apply 2 g topically 4 (four) times daily.  Patient not taking: Reported on 11/02/2022   Particia Nearing, PA-C Not Taking Active   gabapentin (NEURONTIN) 300 MG capsule [id number] No Take 300 mg by mouth at bedtime. [provider]  Taking Active   hydrOXYzine (VISTARIL) 25 MG capsule [id number] No Take 1 capsule (25 mg total) by mouth every 8 (eight) hours as needed for anxiety. Tommie Sams, DO Taking Active   Melatonin 10 MG TABS [id number] No Take 10 mg by mouth at bedtime as needed (sleep). [provider] Taking Active   Multiple Vitamin (MULTIVITAMIN) tablet [id number] No Take 1 tablet by mouth daily. [provider] Taking Active Self  ondansetron (ZOFRAN) 4 MG tablet [id number] No Take 4 mg by mouth every 6 (six) hours as needed for nausea or vomiting. [provider] Taking Active   oxyCODONE (OXY IR/ROXICODONE) 5 MG immediate release tablet [id number] No  [provider] Taking Active   pantoprazole (PROTONIX) 20 MG tablet [id number] No Take by mouth. [provider] Taking Active   rivaroxaban (XARELTO) 20 MG TABS tablet [id number]  Take 1 tablet (20 mg total) by mouth daily with supper. Take with food. Start taking after completion of starter pack. Pervis Hocking B, RPH-CPP  Active   Rivaroxaban Starter Pack, 15 mg and 20 mg, Carlena Hurl Laurens) [id number]  Follow package directions: Take one 15mg  tablet by mouth twice a day. On day 22, switch to one 20mg  tablet once a day. Take with food. Dicie Beam, RPH-CPP  Active             Home Care and Equipment/Supplies: Were Home Health Services Ordered?: NA Any new equipment or medical supplies ordered?: NA  Functional Questionnaire: Do you need assistance with bathing/showering or dressing?: No Do you need assistance with meal preparation?: No Do you need assistance with eating?: No Do you  have difficulty maintaining continence: No Do you need assistance with getting out of bed/getting out of a chair/moving?: No Do you have difficulty managing or taking your medications?: No  Follow up appointments reviewed: PCP Follow-up appointment confirmed?: NA Specialist Hospital Follow-up appointment confirmed?: Yes Date  of Specialist follow-up appointment?: 12/08/22 Follow-Up Specialty Provider:: ortho Do you need transportation to your follow-up appointment?: No Do you understand care options if your condition(s) worsen?: Yes-patient verbalized understanding    SIGNATURE Karena Addison, LPN [facility] Nurse Health Advisor Direct Dial [phone]

## 2022-11-11 DIAGNOSIS — M216X1 Other acquired deformities of right foot: Secondary | ICD-10-CM | POA: Diagnosis not present

## 2022-12-03 ENCOUNTER — Encounter (HOSPITAL_COMMUNITY): Payer: Self-pay

## 2022-12-03 ENCOUNTER — Ambulatory Visit (HOSPITAL_COMMUNITY)
Admission: RE | Admit: 2022-12-03 | Discharge: 2022-12-03 | Disposition: A | Discharge status: Home / Self Care | Payer: BC Managed Care – PPO | Source: Ambulatory Visit | Attending: Vascular Surgery | Admitting: Vascular Surgery

## 2022-12-03 VITALS — BP 120/76 | HR 71

## 2022-12-03 DIAGNOSIS — I82441 Acute embolism and thrombosis of right tibial vein: Secondary | ICD-10-CM | POA: Insufficient documentation

## 2022-12-03 NOTE — Patient Instructions (Signed)
-  Continue rivaroxaban (Xarelto) 20 mg daily with food. -Your refills have been sent to your [facility]. You may need to call the pharmacy to ask them to fill this when you start to run low on your current supply.  -It is important to take your medication around the same time every day.  -Avoid NSAIDs like ibuprofen (Advil, Motrin) and naproxen (Aleve) as well as aspirin doses over 100 mg daily. -Tylenol (acetaminophen) is the preferred over the counter pain medication to lower the risk of bleeding. -Be sure to alert all of your health care providers that you are taking an anticoagulant prior to starting a new medication or having a procedure. -Monitor for signs and symptoms of bleeding (abnormal bruising, prolonged bleeding, nose bleeds, bleeding from gums, discolored urine, black tarry stools). If you have fallen and hit your head OR if your bleeding is severe or not stopping, seek emergency care.  -Go to the emergency room if emergent signs and symptoms of new clot occur (new or worse swelling and pain in an arm or leg, shortness of breath, chest pain, fast or irregular heartbeats, lightheadedness, dizziness, fainting, coughing up blood) or if you experience a significant color change (pale or blue) in the extremity that has the DVT.  -We recommend you wear compression stockings (20-30 mmHg) as long as you are having swelling or pain. Be sure to purchase the correct size and take them off at night. Elevate your leg with swelling.   Your next visit is on September 24th at 2:30pm.  [facility] [address] [place], [place], [place] [address] Enter the hospital through Entrance C off [facility] and pull up to the [facility] Center entrance to the free valet parking.  Check in for your appointment at the [facility].   If you have any questions or need to reschedule an appointment, please call [phone] [facility].  If you are having an emergency, call 911 or  present to the nearest emergency room.   What is a DVT?  -Deep vein thrombosis (DVT) is a condition in which a blood clot forms in a vein of the deep venous system which can occur in the lower leg, thigh, pelvis, arm, or neck. This condition is serious and can be life-threatening if the clot travels to the arteries of the lungs and causing a blockage (pulmonary embolism, PE). A DVT can also damage veins in the leg, which can lead to long-term venous disease, leg pain, swelling, discoloration, and ulcers or sores (post-thrombotic syndrome).  -Treatment may include taking an anticoagulant medication to prevent more clots from forming and the current clot from growing, wearing compression stockings, and/or surgical procedures to remove or dissolve the clot.

## 2022-12-03 NOTE — Progress Notes (Addendum)
DVT Clinic Note  Name: Patricia Becker     MRN: [id number]     DOB: 09-14-1975     Sex: female  PCP: Tommie Sams, DO  Today's Visit: Visit Information: Follow Up Visit  Referred to DVT Clinic by: Dr. Susa Simmonds ([facility])   Referred to CPP by: Dr. Karin Lieu Reason for referral:  Chief Complaint  Patient presents with   Med Management - DVT   HISTORY OF PRESENT ILLNESS: Patricia Becker is a 47 y.o. female with PMH anxiety and depression who presents for follow up medication management after diagnosis of acute DVT involving the right peroneal and posterior tibial veins on 11/02/22 s/p R achilles and peroneal tendon surgery on 09/17/22. She completed 30 days of aspirin 81 mg BID, but DVT symptoms began after this. Last seen in DVT Clinic 11/02/22 at which time Xarelto was started.   Today patient reports that her foot is continuing to improve from her surgery and her mobility is improving. The pain and swelling in her calf has improved but is not completely resolved. She reports menses last month was heavier and longer than usual (10 days compared to 3-5 days). Otherwise denies abnormal bleeding or bruising. Denies missed doses of Xarelto. She is out of the boot and now in an ankle boot. She is not wearing compression stockings.  Positive Thrombotic Risk Factors: Recent surgery (within 3 months), Bed rest >72 hours within 3 month, Obesity Bleeding Risk Factors: Anticoagulant therapy  Negative Thrombotic Risk Factors: Previous VTE, Recent trauma (within 3 months), Recent admission to hospital with acute illness (within 3 months), Paralysis, paresis, or recent plaster cast immobilization of lower extremity, Central venous catheterization, Sedentary journey lasting >8 hours within 4 weeks, Pregnancy, Within 6 weeks postpartum, Recent cesarean section (within 3 months), Estrogen therapy, Testosterone therapy, Erythropoiesis-stimulating agent, Recent COVID diagnosis (within 3 months), Active cancer,  Non-malignant, chronic inflammatory condition, Known thrombophilic condition, Smoking, Older age  Rx Insurance Coverage: Commercial Rx Affordability: Xarelto is $193/month. Filled at last visit using one time $0 savings card. Provided patient with copay card to reduce cost of refills to $10/month.  Preferred Pharmacy: Refills sent to patient's [facility].  Past Medical History:  Diagnosis Date   Anxiety    Anxiety    Depression    Depression    Diverticulosis    Encounter for screening for HIV 07/08/2017   Hiatal hernia    Polypharmacy 07/08/2017    Past Surgical History:  Procedure Laterality Date   CESAREAN SECTION     COLONOSCOPY     2017, normal.    ESOPHAGOGASTRODUODENOSCOPY     KNEE SURGERY  1981   TUBAL LIGATION      Social History   Socioeconomic History   Marital status: Married    Spouse name: Dwayne   Number of children: 3   Years of education: Not on file   Highest education level: Associate degree: academic program  Occupational History   Not on file  Tobacco Use   Smoking status: Never    Passive exposure: Yes   Smokeless tobacco: Never  Vaping Use   Vaping status: Never Used  Substance and Sexual Activity   Alcohol use: Yes    Alcohol/week: 2.0 standard drinks of alcohol    Types: 2 Glasses of wine per week    Comment: very rarely   Drug use: No   Sexual activity: Yes    Birth control/protection: Surgical    Comment: tubal  Other Topics Concern   Not  on file  Social History Narrative   Moved from [place], [place] in 01/2017. Had lived there for over 20 years. Moved with husband, son, and daughter.       Daughter is 28 y/o-married, expecting and lives in [place]   Daughter is 56 y/o married, has 2 children: 3 and 1 y/o living with her right now   Son is 79 y/o.      Daugther is 31 y/o- husband daughter-step daughter. Lives with her mother now.      Enjoys walking trials, and working out when she could, netflix and chill, and family      Diet:  reports she use to meal prep, but has stopped, is supplementing with green powders, meats, fruits   Caffeine: 20 oz 3-4 time a week, no soda, tea- at times mixed sweet and unsweet   Water: 5-8 cups daily      Wears seatbelt.    Wears sunscreen.   Smoke detectors at home.   Does not use phone while driving.   Social Determinants of Health   Financial Resource Strain: Low Risk  (10/06/2022)   Overall Financial Resource Strain (CARDIA)    Difficulty of Paying Living Expenses: Not very hard  Food Insecurity: No Food Insecurity (10/06/2022)   Hunger Vital Sign    Worried About Running Out of Food in the Last Year: Never true    Ran Out of Food in the Last Year: Never true  Transportation Needs: No Transportation Needs (10/06/2022)   PRAPARE - Administrator, Civil Service (Medical): No    Lack of Transportation (Non-Medical): No  Physical Activity: Inactive (10/06/2022)   Exercise Vital Sign    Days of Exercise per Week: 0 days    Minutes of Exercise per Session: 30 min  Stress: Stress Concern Present (10/06/2022)   [employer] of Occupational Health - Occupational Stress Questionnaire    Feeling of Stress : To some extent  Social Connections: Moderately Isolated (10/06/2022)   Social Connection and Isolation Panel [NHANES]    Frequency of Communication with Friends and Family: Twice a week    Frequency of Social Gatherings with Friends and Family: Once a week    Attends Religious Services: Never    Database administrator or Organizations: No    Attends Banker Meetings: Never    Marital Status: Married  Catering manager Violence: Not At Risk (05/19/2022)   Humiliation, Afraid, Rape, and Kick questionnaire    Fear of Current or Ex-Partner: No    Emotionally Abused: No    Physically Abused: No    Sexually Abused: No    Family History  Problem Relation Age of Onset   Colon polyps Mother    Suicidality Father    Hypertension Sister    Colon cancer  Maternal Grandmother    Cancer Maternal Grandmother    Migraines Daughter    Drug abuse Other     Allergies as of 12/03/2022 - Review Complete 12/03/2022  Allergen Reaction Noted   Demerol [meperidine hcl] Anaphylaxis 07/08/2017   Meperidine Anaphylaxis 05/30/2019   Aspirin Other (See Comments) 07/08/2017   Nsaids  07/24/2022    Current Outpatient Medications on File Prior to Encounter  Medication Sig Dispense Refill   acetaminophen (TYLENOL) 500 MG tablet Take by mouth.     ARIPiprazole (ABILIFY) 5 MG tablet TAKE 1 TABLET(5 MG) BY MOUTH DAILY 90 tablet 3   buPROPion (WELLBUTRIN XL) 300 MG 24 hr tablet TAKE 1 TABLET(300 MG)  BY MOUTH DAILY 90 tablet 0   busPIRone (BUSPAR) 10 MG tablet TAKE 1 TABLET(10 MG) BY MOUTH TWICE DAILY (Patient taking differently: Take 10 mg by mouth daily.) 180 tablet 0   hydrOXYzine (VISTARIL) 25 MG capsule Take 1 capsule (25 mg total) by mouth every 8 (eight) hours as needed for anxiety. 90 capsule 2   Melatonin 10 MG TABS Take 10 mg by mouth at bedtime as needed (sleep).     Multiple Vitamin (MULTIVITAMIN) tablet Take 1 tablet by mouth daily.     ondansetron (ZOFRAN) 4 MG tablet Take 4 mg by mouth every 6 (six) hours as needed for nausea or vomiting.     oxyCODONE (OXY IR/ROXICODONE) 5 MG immediate release tablet      pantoprazole (PROTONIX) 20 MG tablet Take by mouth.     rivaroxaban (XARELTO) 20 MG TABS tablet Take 1 tablet (20 mg total) by mouth daily with supper. Take with food. Start taking after completion of starter pack. 30 tablet 1   diclofenac Sodium (VOLTAREN) 1 % GEL Apply 2 g topically 4 (four) times daily. (Patient not taking: Reported on 11/02/2022) 150 g 2   No current facility-administered medications on file prior to encounter.   REVIEW OF SYSTEMS:  Review of Systems  Respiratory:  Negative for shortness of breath.   Cardiovascular:  Positive for leg swelling. Negative for chest pain and palpitations.  Musculoskeletal:  Positive for  myalgias.  Neurological:  Positive for tingling (bottom of foot). Negative for dizziness.   PHYSICAL EXAMINATION:  Vitals:   12/03/22 1416  BP: 120/76  Pulse: 71  SpO2: 100%    Physical Exam Vitals reviewed.  Cardiovascular:     Rate and Rhythm: Normal rate.  Pulmonary:     Effort: Pulmonary effort is normal.  Musculoskeletal:        General: Tenderness present.     Right lower leg: Edema (mild) present.     Left lower leg: No edema.  Skin:    Findings: No bruising or erythema.  Psychiatric:        Mood and Affect: Mood normal.        Behavior: Behavior normal.        Thought Content: Thought content normal.   Villalta Score for Post-Thrombotic Syndrome: Pain: Mild Cramps: Absent Heaviness: Absent Paresthesia: Mild Pruritus: Mild Pretibial Edema: Mild Skin Induration: Absent Hyperpigmentation: Absent Redness: Absent Venous Ectasia: Absent Pain on calf compression: Absent Villalta Preliminary Score: 4 Is venous ulcer present?: No If venous ulcer is present and score is <15, then 15 points total are assigned: Absent Villalta Total Score: 4  LABS:  CBC     Component Value Date/Time   WBC 8.7 04/13/2019 2332   RBC 4.27 04/13/2019 2332   HGB 12.5 04/13/2019 2332   HCT 39.7 04/13/2019 2332   PLT 234 04/13/2019 2332   MCV 93.0 04/13/2019 2332   MCH 29.3 04/13/2019 2332   MCHC 31.5 04/13/2019 2332   RDW 13.7 04/13/2019 2332   LYMPHSABS 2.1 04/13/2019 2332   MONOABS 0.8 04/13/2019 2332   EOSABS 0.2 04/13/2019 2332   BASOSABS 0.1 04/13/2019 2332    Hepatic Function      Component Value Date/Time   PROT 6.3 (L) 04/13/2019 2332   ALBUMIN 3.6 04/13/2019 2332   AST 13 (L) 04/13/2019 2332   ALT 22 04/13/2019 2332   ALKPHOS 69 04/13/2019 2332   BILITOT 0.4 04/13/2019 2332   BILIDIR 0.1 07/08/2017 1010   IBILI 0.4 07/08/2017 1010  Renal Function   Lab Results  Component Value Date   CREATININE 0.79 04/13/2019   CREATININE 1.00 07/08/2017    CrCl cannot  be calculated (Patient's most recent lab result is older than the maximum 21 days allowed.).   No concerns on labs in CareEverywhere from 04/24/22: -Scr 0.84, eGFR 87, LFTs wnl, Hgb 13.6, plt 293K  [facility] Vascular Lab Studies:  11/02/22 VAS [place] LOWER EXTREMITY VENOUS (DVT)RIGHT  Summary:  RIGHT:  - Findings consistent with acute deep vein thrombosis involving the right  posterior tibial veins, and right peroneal veins.  - No cystic structure found in the popliteal fossa.   LEFT:  - No evidence of common femoral vein obstruction.   ASSESSMENT: Location of DVT: Right distal vein Cause of DVT: provoked by a transient risk factor- s/p R achilles and peroneal tendon surgery on 09/17/22. She developed RLE pain and swelling a little over 1 month later. Xarelto was started 11/02/22 with plans for 3 months of anticoagulation for provoked DVT.   RLE pain and swelling have improved but not completely resolved yet. Her foot is no longer in a boot and she is improving well from her surgery. She has been adherent to Xarelto with no medication access issues. She hopes to go back to work soon, and we discussed the importance of compression and elevation to help with any swelling that comes up from being on her feet longer than she's been used to lately. Will continue current plan to finish 3 months of anticoagulation with Xarelto.   PLAN: -Continue rivaroxaban (Xarelto) 20 mg daily with food. -Expected duration of therapy: 3 months. Therapy started on 11/02/22. -Patient educated on purpose, proper use and potential adverse effects of rivaroxaban (Xarelto). -Discussed importance of taking medication around the same time every day. -Advised patient of medications to avoid (NSAIDs, aspirin doses >100 mg daily). -Educated that Tylenol (acetaminophen) is the preferred analgesic to lower the risk of bleeding. -Advised patient to alert all providers of anticoagulation therapy prior to starting a new medication or  having a procedure. -Emphasized importance of monitoring for signs and symptoms of bleeding (abnormal bruising, prolonged bleeding, nose bleeds, bleeding from gums, discolored urine, black tarry stools). -Educated patient to present to the ED if emergent signs and symptoms of new thrombosis occur. -Counseled patient to wear compression stockings daily, removing at night. Elevate legs to help with swelling.   Follow up: 2 months at end of treatment  Pervis Hocking, PharmD, BCACP, CPP Deep [facility]in Thrombosis Clinic Clinical Pharmacist Practitioner Office: [phone]  I have evaluated the patient's chart/imaging and refer this patient to the Clinical Pharmacist Practitioner for medication management. I have reviewed the CPP's documentation and agree with her assessment and plan. I was immediately available during the visit for questions and collaboration.   Victorino Sparrow, MD

## 2022-12-09 DIAGNOSIS — M160 Bilateral primary osteoarthritis of hip: Secondary | ICD-10-CM | POA: Diagnosis not present

## 2022-12-09 DIAGNOSIS — M216X1 Other acquired deformities of right foot: Secondary | ICD-10-CM | POA: Diagnosis not present

## 2023-01-08 ENCOUNTER — Ambulatory Visit: Payer: BC Managed Care – PPO | Admitting: Nurse Practitioner

## 2023-01-08 VITALS — BP 122/76 | HR 70 | Temp 98.1°F | Ht 60.0 in | Wt 213.0 lb

## 2023-01-08 DIAGNOSIS — G2581 Restless legs syndrome: Secondary | ICD-10-CM | POA: Diagnosis not present

## 2023-01-08 DIAGNOSIS — N939 Abnormal uterine and vaginal bleeding, unspecified: Secondary | ICD-10-CM | POA: Diagnosis not present

## 2023-01-08 DIAGNOSIS — E559 Vitamin D deficiency, unspecified: Secondary | ICD-10-CM

## 2023-01-08 DIAGNOSIS — R5383 Other fatigue: Secondary | ICD-10-CM | POA: Diagnosis not present

## 2023-01-08 DIAGNOSIS — Z79899 Other long term (current) drug therapy: Secondary | ICD-10-CM | POA: Diagnosis not present

## 2023-01-08 NOTE — Progress Notes (Unsigned)
   Subjective:    Patient ID: Patricia Becker, female    DOB: 1976/04/11, 47 y.o.   MRN: [id number]  HPI Pt comes in today for restless arms arms and legs that started a few months ago but has progressively getting worse. Has been going on for several months.  Worse at bedtime or at night or if she has been sitting for prolonged period of time.  Occurs most days.  Usually last 30 minutes or more.  Describes a creepy crawly tingling slightly burning sensation.  Better with rubbing the extremity.  Stopped melatonin to see if this could be a possible trigger but symptoms have continued.  Patient having very heavy cycles, note that she is on Xarelto for several months due to DVT in June 2024.  Sees GYN for her gynecology care.  Review of Systems  Constitutional:  Positive for fatigue.  HENT:  Negative for sore throat and trouble swallowing.   Respiratory:  Positive for shortness of breath. Negative for cough and chest tightness.        Mild shortness of breath with activity, unchanged.  Cardiovascular:  Negative for chest pain and palpitations.  PMH includes recent DVT with current Xarelto use, abnormal uterine bleeding and vitamin D deficiency.     Objective:   Physical Exam NAD.  Alert, oriented.  Calm cheerful affect.  Thyroid nontender to palpation, no mass or goiter noted.  Lungs clear.  Heart regular rate rhythm.  No murmur noted.  Hand and arm strength 5+ bilaterally.  Gait normal limit.  Reflexes normal limit.  Strong radial pulses bilaterally.  Hands warm with normal capillary refill.  Sensation grossly intact. Today's Vitals   01/08/23 1522  BP: 122/76  Pulse: 70  Temp: 98.1 F (36.7 C)  SpO2: 99%  Weight: 213 lb (96.6 kg)  Height: 5' (1.524 m)   Body mass index is 41.6 kg/m.       Assessment & Plan:   Problem List Items Addressed This Visit       Genitourinary   Abnormal uterine bleeding (AUB)   Relevant Orders   CBC with Differential/Platelet (Completed)   Vitamin  B12 (Completed)   Iron, TIBC and Ferritin Panel (Completed)     Other   Restless legs syndrome - Primary   Relevant Orders   CBC with Differential/Platelet (Completed)   Comprehensive metabolic panel (Completed)   TSH (Completed)   VITAMIN D 25 Hydroxy (Vit-D Deficiency, Fractures) (Completed)   Vitamin B12 (Completed)   Iron, TIBC and Ferritin Panel (Completed)   Magnesium (Completed)   Vitamin D deficiency   Relevant Orders   VITAMIN D 25 Hydroxy (Vit-D Deficiency, Fractures) (Completed)   Other Visit Diagnoses     Fatigue, unspecified type       Relevant Orders   CBC with Differential/Platelet (Completed)   Comprehensive metabolic panel (Completed)   TSH (Completed)   VITAMIN D 25 Hydroxy (Vit-D Deficiency, Fractures) (Completed)   Vitamin B12 (Completed)   Iron, TIBC and Ferritin Panel (Completed)   Magnesium (Completed)   High risk medication use       Relevant Orders   CBC with Differential/Platelet (Completed)   Magnesium (Completed)      Labs pending. Further follow-up based on lab results. Recommend patient contact gynecology as soon as possible to discuss options regarding heavy menstrual cycles especially considering current use of Xarelto. Warning signs reviewed. Return if symptoms worsen or fail to improve.

## 2023-01-09 ENCOUNTER — Encounter: Payer: Self-pay | Admitting: Nurse Practitioner

## 2023-01-09 ENCOUNTER — Other Ambulatory Visit: Payer: Self-pay | Admitting: Nurse Practitioner

## 2023-01-09 DIAGNOSIS — G2581 Restless legs syndrome: Secondary | ICD-10-CM | POA: Insufficient documentation

## 2023-01-09 DIAGNOSIS — D500 Iron deficiency anemia secondary to blood loss (chronic): Secondary | ICD-10-CM

## 2023-01-09 DIAGNOSIS — D509 Iron deficiency anemia, unspecified: Secondary | ICD-10-CM | POA: Insufficient documentation

## 2023-01-09 LAB — CBC WITH DIFFERENTIAL/PLATELET
Basophils Absolute: 0.1 10*3/uL (ref 0.0–0.2)
Basos: 1 %
EOS (ABSOLUTE): 0.1 10*3/uL (ref 0.0–0.4)
Eos: 2 %
Hematocrit: 30.5 % — ABNORMAL LOW (ref 34.0–46.6)
Hemoglobin: 9.4 g/dL — ABNORMAL LOW (ref 11.1–15.9)
Immature Grans (Abs): 0 10*3/uL (ref 0.0–0.1)
Immature Granulocytes: 0 %
Lymphocytes Absolute: 1.9 10*3/uL (ref 0.7–3.1)
Lymphs: 33 %
MCH: 24.4 pg — ABNORMAL LOW (ref 26.6–33.0)
MCHC: 30.8 g/dL — ABNORMAL LOW (ref 31.5–35.7)
MCV: 79 fL (ref 79–97)
Monocytes Absolute: 0.6 10*3/uL (ref 0.1–0.9)
Monocytes: 10 %
Neutrophils Absolute: 3.2 10*3/uL (ref 1.4–7.0)
Neutrophils: 54 %
Platelets: 327 10*3/uL (ref 150–450)
RBC: 3.86 x10E6/uL (ref 3.77–5.28)
RDW: 13.6 % (ref 11.7–15.4)
WBC: 5.9 10*3/uL (ref 3.4–10.8)

## 2023-01-09 LAB — COMPREHENSIVE METABOLIC PANEL
ALT: 32 IU/L (ref 0–32)
AST: 19 IU/L (ref 0–40)
Albumin: 4.1 g/dL (ref 3.9–4.9)
Alkaline Phosphatase: 85 IU/L (ref 44–121)
BUN/Creatinine Ratio: 9 (ref 9–23)
BUN: 9 mg/dL (ref 6–24)
Bilirubin Total: 0.3 mg/dL (ref 0.0–1.2)
CO2: 23 mmol/L (ref 20–29)
Calcium: 9 mg/dL (ref 8.7–10.2)
Chloride: 104 mmol/L (ref 96–106)
Creatinine, Ser: 0.95 mg/dL (ref 0.57–1.00)
Globulin, Total: 2.3 g/dL (ref 1.5–4.5)
Glucose: 91 mg/dL (ref 70–99)
Potassium: 4.3 mmol/L (ref 3.5–5.2)
Sodium: 139 mmol/L (ref 134–144)
Total Protein: 6.4 g/dL (ref 6.0–8.5)
eGFR: 74 mL/min/{1.73_m2} (ref >59)

## 2023-01-09 LAB — IRON,TIBC AND FERRITIN PANEL
Ferritin: 5 ng/mL — ABNORMAL LOW (ref 15–150)
Iron Saturation: 3 % — CL (ref 15–55)
Iron: 14 ug/dL — ABNORMAL LOW (ref 27–159)
Total Iron Binding Capacity: 446 ug/dL (ref 250–450)
UIBC: 432 ug/dL — ABNORMAL HIGH (ref 131–425)

## 2023-01-09 LAB — MAGNESIUM: Magnesium: 2 mg/dL (ref 1.6–2.3)

## 2023-01-09 LAB — TSH: TSH: 1.99 u[IU]/mL (ref 0.450–4.500)

## 2023-01-09 LAB — VITAMIN D 25 HYDROXY (VIT D DEFICIENCY, FRACTURES): Vit D, 25-Hydroxy: 28 ng/mL — ABNORMAL LOW (ref 30.0–100.0)

## 2023-01-09 LAB — VITAMIN B12: Vitamin B-12: 510 pg/mL (ref 232–1245)

## 2023-01-13 ENCOUNTER — Ambulatory Visit: Payer: BC Managed Care – PPO | Admitting: Adult Health

## 2023-01-13 NOTE — Progress Notes (Signed)
[facility] [address], [place] [address]   Clinic Day:  01/13/2023  Referring physician: Tommie Sams, DO  Patient Care Team: Tommie Sams, DO as PCP - General (Family Medicine)   ASSESSMENT & PLAN:   Assessment: ***  Plan: ***  No orders of the defined types were placed in this encounter.     Alben Deeds Teague,acting as a Neurosurgeon for Doreatha Massed, MD.,have documented all relevant documentation on the behalf of Doreatha Massed, MD,as directed by  Doreatha Massed, MD while in the presence of Doreatha Massed, MD.   ***  Stanford R Teague   9/4/20247:24 PM  CHIEF COMPLAINT/PURPOSE OF CONSULT:   Diagnosis: ***  Current Therapy:  ***  HISTORY OF PRESENT ILLNESS:   Marlette is a 47 y.o. female presenting to clinic today for evaluation of anemia at the request of Tommie Sams, DO.  Today, she states that she is doing well overall. Her appetite level is at ***%. Her energy level is at ***%.  She was found to have abnormal CBC from 01/08/23 with low HGB at 9.4, low HCT at 30.5, low MCH at 24.4, and low MCHC at 30.8. She also had abnormal Iron, TIBC, and Ferritin Panel from 01/08/23 with elevated UIBC at 432, low iron at 14, severely low iron saturation at 3, and low ferritin at 5. Of note, her Vitamin D level from 01/08/23 was low at 28.0.   ***She denies recent chest pain on exertion, shortness of breath on minimal exertion, pre-syncopal episodes, or palpitations. ***She had not noticed any recent bleeding such as epistaxis, hematuria or hematochezia ***The patient denies over the counter NSAID ingestion. She is not *** on antiplatelets agents. Her last colonoscopy was *** ***She had no prior history or diagnosis of cancer. She denies any family history of cancer.  *** Her age appropriate screening programs are up-to-date. ***She denies any pica and eats a variety of diet. ***She never donated blood or received blood  transfusion. ***The patient was prescribed oral iron supplements and she takes ***  PAST MEDICAL HISTORY:   Past Medical History: Past Medical History:  Diagnosis Date   Anxiety    Anxiety    Depression    Depression    Diverticulosis    Encounter for screening for HIV 07/08/2017   Hiatal hernia    Polypharmacy 07/08/2017    Surgical History: Past Surgical History:  Procedure Laterality Date   CESAREAN SECTION     COLONOSCOPY     2017, normal.    ESOPHAGOGASTRODUODENOSCOPY     KNEE SURGERY  1981   TUBAL LIGATION      Social History: Social History   Socioeconomic History   Marital status: Married    Spouse name: Dwayne   Number of children: 3   Years of education: Not on file   Highest education level: Associate degree: academic program  Occupational History   Not on file  Tobacco Use   Smoking status: Never    Passive exposure: Yes   Smokeless tobacco: Never  Vaping Use   Vaping status: Never Used  Substance and Sexual Activity   Alcohol use: Yes    Alcohol/week: 2.0 standard drinks of alcohol    Types: 2 Glasses of wine per week    Comment: very rarely   Drug use: No   Sexual activity: Yes    Birth control/protection: Surgical    Comment: tubal  Other Topics Concern   Not on file  Social History Narrative   Moved from [place], [place] in 01/2017. Had lived there for over 20 years. Moved with husband, son, and daughter.       Daughter is 19 y/o-married, expecting and lives in [place]   Daughter is 93 y/o married, has 2 children: 3 and 1 y/o living with her right now   Son is 82 y/o.      Daugther is 12 y/o- husband daughter-step daughter. Lives with her mother now.      Enjoys walking trials, and working out when she could, netflix and chill, and family      Diet: reports she use to meal prep, but has stopped, is supplementing with green powders, meats, fruits   Caffeine: 20 oz 3-4 time a week, no soda, tea- at times mixed sweet and unsweet   Water:  5-8 cups daily      Wears seatbelt.    Wears sunscreen.   Smoke detectors at home.   Does not use phone while driving.   Social Determinants of Health   Financial Resource Strain: Low Risk  (10/06/2022)   Overall Financial Resource Strain (CARDIA)    Difficulty of Paying Living Expenses: Not very hard  Food Insecurity: No Food Insecurity (10/06/2022)   Hunger Vital Sign    Worried About Running Out of Food in the Last Year: Never true    Ran Out of Food in the Last Year: Never true  Transportation Needs: No Transportation Needs (10/06/2022)   PRAPARE - Administrator, Civil Service (Medical): No    Lack of Transportation (Non-Medical): No  Physical Activity: Inactive (10/06/2022)   Exercise Vital Sign    Days of Exercise per Week: 0 days    Minutes of Exercise per Session: 30 min  Stress: Stress Concern Present (10/06/2022)   [employer] of Occupational Health - Occupational Stress Questionnaire    Feeling of Stress : To some extent  Social Connections: Moderately Isolated (10/06/2022)   Social Connection and Isolation Panel [NHANES]    Frequency of Communication with Friends and Family: Twice a week    Frequency of Social Gatherings with Friends and Family: Once a week    Attends Religious Services: Never    Database administrator or Organizations: No    Attends Banker Meetings: Never    Marital Status: Married  Catering manager Violence: Not At Risk (05/19/2022)   Humiliation, Afraid, Rape, and Kick questionnaire    Fear of Current or Ex-Partner: No    Emotionally Abused: No    Physically Abused: No    Sexually Abused: No    Family History: Family History  Problem Relation Age of Onset   Colon polyps Mother    Suicidality Father    Hypertension Sister    Colon cancer Maternal Grandmother    Cancer Maternal Grandmother    Migraines Daughter    Drug abuse Other     Current Medications:  Current Outpatient Medications:    acetaminophen  (TYLENOL) 500 MG tablet, Take by mouth., Disp: , Rfl:    ARIPiprazole (ABILIFY) 5 MG tablet, TAKE 1 TABLET(5 MG) BY MOUTH DAILY, Disp: 90 tablet, Rfl: 3   buPROPion (WELLBUTRIN XL) 300 MG 24 hr tablet, TAKE 1 TABLET(300 MG) BY MOUTH DAILY, Disp: 90 tablet, Rfl: 0   busPIRone (BUSPAR) 10 MG tablet, TAKE 1 TABLET(10 MG) BY MOUTH TWICE DAILY (Patient taking differently: Take 10 mg by mouth daily.), Disp: 180 tablet, Rfl: 0   hydrOXYzine (VISTARIL) 25  MG capsule, Take 1 capsule (25 mg total) by mouth every 8 (eight) hours as needed for anxiety., Disp: 90 capsule, Rfl: 2   Multiple Vitamin (MULTIVITAMIN) tablet, Take 1 tablet by mouth daily., Disp: , Rfl:    ondansetron (ZOFRAN) 4 MG tablet, Take 4 mg by mouth every 6 (six) hours as needed for nausea or vomiting., Disp: , Rfl:    pantoprazole (PROTONIX) 20 MG tablet, Take by mouth., Disp: , Rfl:    rivaroxaban (XARELTO) 20 MG TABS tablet, Take 1 tablet (20 mg total) by mouth daily with supper. Take with food. Start taking after completion of starter pack., Disp: 30 tablet, Rfl: 1   Allergies: Allergies  Allergen Reactions   Demerol [Meperidine Hcl] Anaphylaxis   Meperidine Anaphylaxis   Aspirin Other (See Comments)    Upset stomach   Nsaids     Pt reports has had "gi issues" lately and was told not to have steroid or NSAIDS prescribed at this time.    REVIEW OF SYSTEMS:   Review of Systems  Constitutional:  Negative for chills, fatigue and fever.  HENT:   Negative for lump/mass, mouth sores, nosebleeds, sore throat and trouble swallowing.   Eyes:  Negative for eye problems.  Respiratory:  Negative for cough and shortness of breath.   Cardiovascular:  Negative for chest pain, leg swelling and palpitations.  Gastrointestinal:  Negative for abdominal pain, constipation, diarrhea, nausea and vomiting.  Genitourinary:  Negative for bladder incontinence, difficulty urinating, dysuria, frequency, hematuria and nocturia.   Musculoskeletal:   Negative for arthralgias, back pain, flank pain, myalgias and neck pain.  Skin:  Negative for itching and rash.  Neurological:  Negative for dizziness, headaches and numbness.  Hematological:  Does not bruise/bleed easily.  Psychiatric/Behavioral:  Negative for depression, sleep disturbance and suicidal ideas. The patient is not nervous/anxious.   All other systems reviewed and are negative.    VITALS:   Last menstrual period 12/14/2022.  Wt Readings from Last 3 Encounters:  01/08/23 213 lb (96.6 kg)  11/04/22 210 lb (95.3 kg)  07/29/22 206 lb (93.4 kg)    There is no height or weight on file to calculate BMI.   PHYSICAL EXAM:   Physical Exam Vitals and nursing note reviewed. Exam conducted with a chaperone present.  Constitutional:      Appearance: Normal appearance.  Cardiovascular:     Rate and Rhythm: Normal rate and regular rhythm.     Pulses: Normal pulses.     Heart sounds: Normal heart sounds.  Pulmonary:     Effort: Pulmonary effort is normal.     Breath sounds: Normal breath sounds.  Abdominal:     Palpations: Abdomen is soft. There is no hepatomegaly, splenomegaly or mass.     Tenderness: There is no abdominal tenderness.  Musculoskeletal:     Right lower leg: No edema.     Left lower leg: No edema.  Lymphadenopathy:     Cervical: No cervical adenopathy.     Right cervical: No superficial, deep or posterior cervical adenopathy.    Left cervical: No superficial, deep or posterior cervical adenopathy.     Upper Body:     Right upper body: No supraclavicular or axillary adenopathy.     Left upper body: No supraclavicular or axillary adenopathy.  Neurological:     General: No focal deficit present.     Mental Status: She is alert and oriented to person, place, and time.  Psychiatric:  Mood and Affect: Mood normal.        Behavior: Behavior normal.     LABS:      Latest Ref Rng & Units 01/08/2023    3:57 PM 04/13/2019   11:32 PM 07/08/2017   10:10  AM  CBC  WBC 3.4 - 10.8 x10E3/uL 5.9  8.7  6.5   Hemoglobin 11.1 - 15.9 g/dL 9.4  16.1  09.6   Hematocrit 34.0 - 46.6 % 30.5  39.7  41.9   Platelets 150 - 450 x10E3/uL 327  234  253       Latest Ref Rng & Units 01/08/2023    3:57 PM 04/13/2019   11:32 PM 07/08/2017   10:10 AM  CMP  Glucose 70 - 99 mg/dL 91  86  76   BUN 6 - 24 mg/dL 9  16  12    Creatinine 0.57 - 1.00 mg/dL 0.45  4.09  8.11   Sodium 134 - 144 mmol/L 139  140  141   Potassium 3.5 - 5.2 mmol/L 4.3  3.9  4.0   Chloride 96 - 106 mmol/L 104  109  109   CO2 20 - 29 mmol/L 23  23  29    Calcium 8.7 - 10.2 mg/dL 9.0  8.4  9.1   Total Protein 6.0 - 8.5 g/dL 6.4  6.3  6.4    6.4   Total Bilirubin 0.0 - 1.2 mg/dL 0.3  0.4  0.5    0.5   Alkaline Phos 44 - 121 IU/L 85  69    AST 0 - 40 IU/L 19  13  12    12    ALT 0 - 32 IU/L 32  22  17    17       No results found for: "CEA1", "CEA" / No results found for: "CEA1", "CEA" No results found for: "PSA1" No results found for: "BJY782" No results found for: "CAN125"  No results found for: "TOTALPROTELP", "ALBUMINELP", "A1GS", "A2GS", "BETS", "BETA2SER", "GAMS", "MSPIKE", "SPEI" Lab Results  Component Value Date   TIBC 446 01/08/2023   FERRITIN 5 (L) 01/08/2023   IRONPCTSAT 3 (LL) 01/08/2023   No results found for: "LDH"   STUDIES:   No results found.

## 2023-01-14 ENCOUNTER — Inpatient Hospital Stay: Payer: BC Managed Care – PPO | Attending: Hematology | Admitting: Hematology

## 2023-01-14 ENCOUNTER — Encounter: Payer: Self-pay | Admitting: Hematology

## 2023-01-14 VITALS — BP 120/63 | HR 75 | Temp 98.3°F | Resp 18 | Ht 60.0 in | Wt 212.9 lb

## 2023-01-14 DIAGNOSIS — Z7901 Long term (current) use of anticoagulants: Secondary | ICD-10-CM | POA: Diagnosis not present

## 2023-01-14 DIAGNOSIS — D500 Iron deficiency anemia secondary to blood loss (chronic): Secondary | ICD-10-CM

## 2023-01-14 DIAGNOSIS — I82441 Acute embolism and thrombosis of right tibial vein: Secondary | ICD-10-CM | POA: Insufficient documentation

## 2023-01-14 DIAGNOSIS — R5383 Other fatigue: Secondary | ICD-10-CM | POA: Diagnosis not present

## 2023-01-14 DIAGNOSIS — Z86718 Personal history of other venous thrombosis and embolism: Secondary | ICD-10-CM | POA: Diagnosis not present

## 2023-01-14 DIAGNOSIS — Z8041 Family history of malignant neoplasm of ovary: Secondary | ICD-10-CM

## 2023-01-14 DIAGNOSIS — R200 Anesthesia of skin: Secondary | ICD-10-CM

## 2023-01-14 DIAGNOSIS — G2581 Restless legs syndrome: Secondary | ICD-10-CM | POA: Diagnosis not present

## 2023-01-14 DIAGNOSIS — N920 Excessive and frequent menstruation with regular cycle: Secondary | ICD-10-CM

## 2023-01-14 DIAGNOSIS — D508 Other iron deficiency anemias: Secondary | ICD-10-CM

## 2023-01-14 NOTE — Patient Instructions (Signed)
You were seen and examined today by Dr. Ellin Saba. Dr. Ellin Saba is a hematologist, meaning that he specializes in blood abnormalities. Dr. Ellin Saba discussed your past medical history, family history of cancers/blood conditions and the events that led to you being here today.  You were referred to Dr. Ellin Saba due to iron deficiency anemia. We will arrange for you to have IV iron infusions to replenish your iron stores and help improve your hemoglobin and hopefully help improve your energy.  We will see you back in 3 months. We will repeat lab work prior to your next visit.  Return as scheduled.   Follow-up as scheduled.

## 2023-01-19 ENCOUNTER — Inpatient Hospital Stay (HOSPITAL_BASED_OUTPATIENT_CLINIC_OR_DEPARTMENT_OTHER): Payer: BC Managed Care – PPO

## 2023-01-19 VITALS — BP 129/61 | HR 68 | Temp 97.4°F | Resp 18

## 2023-01-19 DIAGNOSIS — Z86718 Personal history of other venous thrombosis and embolism: Secondary | ICD-10-CM | POA: Diagnosis not present

## 2023-01-19 DIAGNOSIS — R5383 Other fatigue: Secondary | ICD-10-CM | POA: Diagnosis not present

## 2023-01-19 DIAGNOSIS — I82441 Acute embolism and thrombosis of right tibial vein: Secondary | ICD-10-CM | POA: Diagnosis not present

## 2023-01-19 DIAGNOSIS — D500 Iron deficiency anemia secondary to blood loss (chronic): Secondary | ICD-10-CM

## 2023-01-19 DIAGNOSIS — G2581 Restless legs syndrome: Secondary | ICD-10-CM | POA: Diagnosis not present

## 2023-01-19 DIAGNOSIS — Z7901 Long term (current) use of anticoagulants: Secondary | ICD-10-CM | POA: Diagnosis not present

## 2023-01-19 DIAGNOSIS — N920 Excessive and frequent menstruation with regular cycle: Secondary | ICD-10-CM | POA: Diagnosis not present

## 2023-01-19 DIAGNOSIS — R200 Anesthesia of skin: Secondary | ICD-10-CM | POA: Diagnosis not present

## 2023-01-19 DIAGNOSIS — Z8041 Family history of malignant neoplasm of ovary: Secondary | ICD-10-CM | POA: Diagnosis not present

## 2023-01-19 MED ORDER — SODIUM CHLORIDE 0.9 % IV SOLN
510.0000 mg | Freq: Once | INTRAVENOUS | Status: AC
  Administered 2023-01-19: 510 mg via INTRAVENOUS
  Filled 2023-01-19: qty 510

## 2023-01-19 MED ORDER — SODIUM CHLORIDE 0.9 % IV SOLN: Freq: Once | INTRAVENOUS | Status: AC

## 2023-01-19 MED ORDER — METHYLPREDNISOLONE SODIUM SUCC 125 MG IJ SOLR
125.0000 mg | Freq: Once | INTRAMUSCULAR | Status: AC
  Administered 2023-01-19: 125 mg via INTRAVENOUS
  Filled 2023-01-19: qty 2

## 2023-01-19 MED ORDER — FAMOTIDINE IN NACL 20-0.9 MG/50ML-% IV SOLN
20.0000 mg | Freq: Once | INTRAVENOUS | Status: AC
  Administered 2023-01-19: 20 mg via INTRAVENOUS

## 2023-01-19 MED ORDER — CETIRIZINE HCL 10 MG/ML IV SOLN
10.0000 mg | Freq: Once | INTRAVENOUS | Status: AC
  Administered 2023-01-19: 10 mg via INTRAVENOUS

## 2023-01-19 NOTE — Patient Instructions (Signed)
 [facility]  Discharge Instructions: Thank you for choosing [facility] to provide your oncology and hematology care.  If you have a lab appointment with the Cancer Center - please note that after April 8th, 2024, all labs will be drawn in the cancer center.  You do not have to check in or register with the main entrance as you have in the past but will complete your check-in in the cancer center.  Wear comfortable clothing and clothing appropriate for easy access to any Portacath or PICC line.   We strive to give you quality time with your provider. You may need to reschedule your appointment if you arrive late (15 or more minutes).  Arriving late affects you and other patients whose appointments are after yours.  Also, if you miss three or more appointments without notifying the office, you may be dismissed from the clinic at the provider's discretion.      For prescription refill requests, have your pharmacy contact our office and allow 72 hours for refills to be completed.    Today you received the following:  Feraheme.  Ferumoxytol Injection What is this medication? FERUMOXYTOL (FER ue MOX i tol) treats low levels of iron in your body (iron deficiency anemia). Iron is a mineral that plays an important role in making red blood cells, which carry oxygen from your lungs to the rest of your body. This medicine may be used for other purposes; ask your health care provider or pharmacist if you have questions. COMMON BRAND NAME(S): Feraheme What should I tell my care team before I take this medication? They need to know if you have any of these conditions: Anemia not caused by low iron levels High levels of iron in the blood Magnetic resonance imaging (MRI) test scheduled An unusual or allergic reaction to iron, other medications, foods, dyes, or preservatives Pregnant or trying to get pregnant Breastfeeding How should I use this medication? This medication  is injected into a vein. It is given by your care team in a hospital or clinic setting. Talk to your care team the use of this medication in children. Special care may be needed. Overdosage: If you think you have taken too much of this medicine contact a poison control center or emergency room at once. NOTE: This medicine is only for you. Do not share this medicine with others. What if I miss a dose? It is important not to miss your dose. Call your care team if you are unable to keep an appointment. What may interact with this medication? Other iron products This list may not describe all possible interactions. Give your health care provider a list of all the medicines, herbs, non-prescription drugs, or dietary supplements you use. Also tell them if you smoke, drink alcohol, or use illegal drugs. Some items may interact with your medicine. What should I watch for while using this medication? Visit your care team regularly. Tell your care team if your symptoms do not start to get better or if they get worse. You may need blood work done while you are taking this medication. You may need to follow a special diet. Talk to your care team. Foods that contain iron include: whole grains/cereals, dried fruits, beans, or peas, leafy green vegetables, and organ meats (liver, kidney). What side effects may I notice from receiving this medication? Side effects that you should report to your care team as soon as possible: Allergic reactions--skin rash, itching, hives, swelling of the  face, lips, tongue, or throat Low blood pressure--dizziness, feeling faint or lightheaded, blurry vision Shortness of breath Side effects that usually do not require medical attention (report to your care team if they continue or are bothersome): Flushing Headache Joint pain Muscle pain Nausea Pain, redness, or irritation at injection site This list may not describe all possible side effects. Call your doctor for medical  advice about side effects. You may report side effects to FDA at 1-800-FDA-1088. Where should I keep my medication? This medication is given in a hospital or clinic. It will not be stored at home. NOTE: This sheet is a summary. It may not cover all possible information. If you have questions about this medicine, talk to your doctor, pharmacist, or health care provider.  2024 Elsevier/Gold Standard (2022-10-02 00:00:00)     To help prevent nausea and vomiting after your treatment, we encourage you to take your nausea medication as directed.  BELOW ARE SYMPTOMS THAT SHOULD BE REPORTED IMMEDIATELY: *FEVER GREATER THAN 100.4 F (38 C) OR HIGHER *CHILLS OR SWEATING *NAUSEA AND VOMITING THAT IS NOT CONTROLLED WITH YOUR NAUSEA MEDICATION *UNUSUAL SHORTNESS OF BREATH *UNUSUAL BRUISING OR BLEEDING *URINARY PROBLEMS (pain or burning when urinating, or frequent urination) *BOWEL PROBLEMS (unusual diarrhea, constipation, pain near the anus) TENDERNESS IN MOUTH AND THROAT WITH OR WITHOUT PRESENCE OF ULCERS (sore throat, sores in mouth, or a toothache) UNUSUAL RASH, SWELLING OR PAIN  UNUSUAL VAGINAL DISCHARGE OR ITCHING   Items with * indicate a potential emergency and should be followed up as soon as possible or go to the Emergency Department if any problems should occur.  Please show the CHEMOTHERAPY ALERT CARD or IMMUNOTHERAPY ALERT CARD at check-in to the Emergency Department and triage nurse.  Should you have questions after your visit or need to cancel or reschedule your appointment, please contact [facility] [phone]  and follow the prompts.  Office hours are 8:00 a.m. to 4:30 p.m. Monday - Friday. Please note that voicemails left after 4:00 p.m. may not be returned until the following business day.  We are closed weekends and major holidays. You have access to a nurse at all times for urgent questions. Please call the main number to the clinic [phone] and follow  the prompts.  For any non-urgent questions, you may also contact your provider using MyChart. We now offer e-Visits for anyone 100 and older to request care online for non-urgent symptoms. For details visit [email or web address].   Also download the MyChart app! Go to the app store, search "MyChart", open the app, select [facility], and log in with your MyChart username and password.

## 2023-01-19 NOTE — Progress Notes (Signed)
Patient presents today for iron infusion.  Patient is in satisfactory condition with no new complaints voiced.  Vital signs are stable.  IV placed in left AC.  IV flushed well with good blood return noted.  We will proceed with infusion per provider orders.   Patient tolerated infusion well with no complaints voiced.  Patient left ambulatory in stable condition.  Vital signs stable at discharge.  Follow up as scheduled.    

## 2023-01-22 ENCOUNTER — Encounter: Payer: Self-pay | Admitting: Adult Health

## 2023-01-22 ENCOUNTER — Ambulatory Visit: Payer: BC Managed Care – PPO | Admitting: Adult Health

## 2023-01-22 VITALS — BP 120/74 | HR 83 | Ht 60.0 in | Wt 212.0 lb

## 2023-01-22 DIAGNOSIS — N921 Excessive and frequent menstruation with irregular cycle: Secondary | ICD-10-CM | POA: Diagnosis not present

## 2023-01-22 NOTE — Progress Notes (Signed)
Subjective:     Patient ID: Patricia Becker, female   DOB: 14-Feb-1976, 47 y.o.   MRN: [id number]  HPI Patricia Becker is a 47 year old white female, married, [id number], in complaining of long, heavy periods. May last 2 weeks and changes pads every hour on heavy days, has had clots. She is on xarelto for DVT right calf, will be coming off in about a week. She had normal pelvic US 05/26/22. Her HGB was 9.4 with Ferritin level of 5 01/08/23 and had iron infusion 9/10 and get another 02/01/23.     Component Value Date/Time   DIAGPAP (A) 05/19/2022 1154    - Atypical squamous cells of undetermined significance (ASC-US)   DIAGPAP  03/09/2019 1358    - Negative for intraepithelial lesion or malignancy (NILM)   HPVHIGH Negative 05/19/2022 1154   HPVHIGH Negative 03/09/2019 1358   ADEQPAP  05/19/2022 1154    Satisfactory for evaluation; transformation zone component PRESENT.   ADEQPAP  03/09/2019 1358    Satisfactory for evaluation; transformation zone component PRESENT.   PCP is Dr Adriana Simas  Review of Systems Long heavy periods with clots Denies any pain Reviewed past medical,surgical, social and family history. Reviewed medications and allergies.     Objective:   Physical Exam BP 120/74 (BP Location: Left Arm, Patient Position: Sitting, Cuff Size: Normal)   Pulse 83   Ht 5' (1.524 m)   Wt 212 lb (96.2 kg)   LMP 01/20/2023   BMI 41.40 kg/m     Skin warm and dry. Lungs: clear to ausculation bilaterally. Cardiovascular: regular rate and rhythm.   Upstream - 01/22/23 1105       Pregnancy Intention Screening   Does the patient want to become pregnant in the next year? No    Does the patient's partner want to become pregnant in the next year? No    Would the patient like to discuss contraceptive options today? No      Contraception Wrap Up   Current Method Female Sterilization    End Method Female Sterilization    Contraception Counseling Provided No             Assessment:     1.  Menorrhagia with irregular cycle Periods may last 2 weeks, and when heavy will cahnge pad every hour, has clots No pain will finish up xarelto next week or so Discussed endometrial ablation or IUD as option She wants to finish the xarelto and she how she is and review handouts given     Plan:     Return 02/10/23 to discuss options with Dr Charlotta Newton has handout on endometrial ablation and mirena  IUD

## 2023-01-29 ENCOUNTER — Inpatient Hospital Stay: Payer: BC Managed Care – PPO

## 2023-01-29 DIAGNOSIS — M216X1 Other acquired deformities of right foot: Secondary | ICD-10-CM | POA: Diagnosis not present

## 2023-02-01 ENCOUNTER — Encounter (HOSPITAL_COMMUNITY): Payer: Self-pay

## 2023-02-01 ENCOUNTER — Inpatient Hospital Stay: Payer: BC Managed Care – PPO

## 2023-02-01 ENCOUNTER — Ambulatory Visit (HOSPITAL_BASED_OUTPATIENT_CLINIC_OR_DEPARTMENT_OTHER)
Admission: RE | Admit: 2023-02-01 | Discharge: 2023-02-01 | Disposition: A | Discharge status: Home / Self Care | Payer: BC Managed Care – PPO | Source: Ambulatory Visit | Attending: Vascular Surgery | Admitting: Vascular Surgery

## 2023-02-01 VITALS — BP 136/56 | HR 68 | Temp 97.9°F | Resp 18

## 2023-02-01 VITALS — BP 125/64 | HR 93

## 2023-02-01 DIAGNOSIS — D500 Iron deficiency anemia secondary to blood loss (chronic): Secondary | ICD-10-CM

## 2023-02-01 DIAGNOSIS — R5383 Other fatigue: Secondary | ICD-10-CM | POA: Diagnosis not present

## 2023-02-01 DIAGNOSIS — I82441 Acute embolism and thrombosis of right tibial vein: Secondary | ICD-10-CM | POA: Diagnosis not present

## 2023-02-01 DIAGNOSIS — Z8041 Family history of malignant neoplasm of ovary: Secondary | ICD-10-CM | POA: Diagnosis not present

## 2023-02-01 DIAGNOSIS — R200 Anesthesia of skin: Secondary | ICD-10-CM | POA: Diagnosis not present

## 2023-02-01 DIAGNOSIS — Z86718 Personal history of other venous thrombosis and embolism: Secondary | ICD-10-CM | POA: Diagnosis not present

## 2023-02-01 DIAGNOSIS — G2581 Restless legs syndrome: Secondary | ICD-10-CM | POA: Diagnosis not present

## 2023-02-01 DIAGNOSIS — N920 Excessive and frequent menstruation with regular cycle: Secondary | ICD-10-CM | POA: Diagnosis not present

## 2023-02-01 DIAGNOSIS — Z7901 Long term (current) use of anticoagulants: Secondary | ICD-10-CM | POA: Diagnosis not present

## 2023-02-01 MED ORDER — FAMOTIDINE IN NACL 20-0.9 MG/50ML-% IV SOLN
20.0000 mg | Freq: Once | INTRAVENOUS | Status: AC
  Administered 2023-02-01: 20 mg via INTRAVENOUS
  Filled 2023-02-01: qty 50

## 2023-02-01 MED ORDER — SODIUM CHLORIDE 0.9 % IV SOLN: Freq: Once | INTRAVENOUS | Status: AC

## 2023-02-01 MED ORDER — METHYLPREDNISOLONE SODIUM SUCC 125 MG IJ SOLR
125.0000 mg | Freq: Once | INTRAMUSCULAR | Status: AC
  Administered 2023-02-01: 125 mg via INTRAVENOUS
  Filled 2023-02-01: qty 2

## 2023-02-01 MED ORDER — SODIUM CHLORIDE 0.9 % IV SOLN
510.0000 mg | Freq: Once | INTRAVENOUS | Status: AC
  Administered 2023-02-01: 510 mg via INTRAVENOUS
  Filled 2023-02-01: qty 510

## 2023-02-01 MED ORDER — CETIRIZINE HCL 10 MG/ML IV SOLN
10.0000 mg | Freq: Once | INTRAVENOUS | Status: AC
  Administered 2023-02-01: 10 mg via INTRAVENOUS
  Filled 2023-02-01: qty 1

## 2023-02-01 NOTE — Progress Notes (Signed)
DVT Clinic Note  Name: Patricia Becker     MRN: [id number]     DOB: 05-10-1976     Sex: female  PCP: Tommie Sams, DO  Today's Visit: Visit Information: Discharge Visit  Referred to DVT Clinic by: Orthopedic Surgery - Dr. Susa Simmonds ([facility]) Referred to CPP by: Dr. Chestine Spore Reason for referral:  Chief Complaint  Patient presents with   Med Management - DVT   HISTORY OF PRESENT ILLNESS: Patricia Becker is a 47 y.o. female PMH anxiety and depression who presents for follow up medication management after diagnosis of acute DVT involving the right peroneal and posterior tibial veins on 11/02/22 s/p R achilles and peroneal tendon surgery on 09/17/22. She completed 30 days of aspirin 81 mg BID, but DVT symptoms began after this. First seen in DVT Clinic 11/02/22 at which time Xarelto was started. Last seen in DVT Clinic 12/03/22 at which time her symptoms were starting to improve but she was experiencing heavier and longer periods. She was recently seen by hematology and completed her second of two iron infusions. She was also seen by her gynecologist for the same.   Today patient reports her leg is feeling better. Her ankle is still in a brace. Endorses continued heavy menstrual bleeding. She is waiting to see how it improves once she has discontinued Xarelto since it has significantly worsened while on anticoagulation and will follow up with hematology and her OBGYN. Otherwise, denies abnormal bleeding or bruising. Denies missed doses of Xarelto. Has not been able to wear compression stockings due to the brace.  Positive Thrombotic Risk Factors: Recent surgery (within 3 months), Bed rest >72 hours within 3 month, Obesity Bleeding Risk Factors: Anticoagulant therapy, Anemia  Negative Thrombotic Risk Factors: Previous VTE, Recent trauma (within 3 months), Recent admission to hospital with acute illness (within 3 months), Paralysis, paresis, or recent plaster cast immobilization of lower extremity, Central  venous catheterization, Sedentary journey lasting >8 hours within 4 weeks, Pregnancy, Within 6 weeks postpartum, Recent cesarean section (within 3 months), Estrogen therapy, Testosterone therapy, Erythropoiesis-stimulating agent, Recent COVID diagnosis (within 3 months), Active cancer, Non-malignant, chronic inflammatory condition, Known thrombophilic condition, Smoking, Older age  Rx Insurance Coverage: Commercial Rx Affordability: Xarelto is $193/month. Filled at first visit using one time $0 savings card. Provided patient with copay card to reduce cost of refills to $10/month.  Rx Assistance Provided:  Free 30-day trial card Co-pay card  Past Medical History:  Diagnosis Date   Anxiety    Anxiety    Depression    Depression    Diverticulosis    Encounter for screening for HIV 07/08/2017   Hiatal hernia    Iron deficiency anemia    Polypharmacy 07/08/2017    Past Surgical History:  Procedure Laterality Date   ANKLE SURGERY Right 09/17/2022   CESAREAN SECTION     COLONOSCOPY     2017, normal.    ESOPHAGOGASTRODUODENOSCOPY     KNEE SURGERY  1981   TUBAL LIGATION      Social History   Socioeconomic History   Marital status: Married    Spouse name: Dwayne   Number of children: 3   Years of education: Not on file   Highest education level: Associate degree: academic program  Occupational History   Not on file  Tobacco Use   Smoking status: Never    Passive exposure: Yes   Smokeless tobacco: Never  Vaping Use   Vaping status: Never Used  Substance and Sexual Activity  Alcohol use: Yes    Alcohol/week: 2.0 standard drinks of alcohol    Types: 2 Glasses of wine per week    Comment: very rarely   Drug use: No   Sexual activity: Yes    Birth control/protection: Surgical    Comment: tubal  Other Topics Concern   Not on file  Social History Narrative   Moved from [place], [place] in 01/2017. Had lived there for over 20 years. Moved with husband, son, and daughter.        Daughter is 31 y/o-married, expecting and lives in [place]   Daughter is 72 y/o married, has 2 children: 3 and 1 y/o living with her right now   Son is 61 y/o.      Daugther is 32 y/o- husband daughter-step daughter. Lives with her mother now.      Enjoys walking trials, and working out when she could, netflix and chill, and family      Diet: reports she use to meal prep, but has stopped, is supplementing with green powders, meats, fruits   Caffeine: 20 oz 3-4 time a week, no soda, tea- at times mixed sweet and unsweet   Water: 5-8 cups daily      Wears seatbelt.    Wears sunscreen.   Smoke detectors at home.   Does not use phone while driving.   Social Determinants of Health   Financial Resource Strain: Low Risk  (10/06/2022)   Overall Financial Resource Strain (CARDIA)    Difficulty of Paying Living Expenses: Not very hard  Food Insecurity: No Food Insecurity (01/14/2023)   Hunger Vital Sign    Worried About Running Out of Food in the Last Year: Never true    Ran Out of Food in the Last Year: Never true  Transportation Needs: No Transportation Needs (01/14/2023)   PRAPARE - Administrator, Civil Service (Medical): No    Lack of Transportation (Non-Medical): No  Physical Activity: Inactive (10/06/2022)   Exercise Vital Sign    Days of Exercise per Week: 0 days    Minutes of Exercise per Session: 30 min  Stress: Stress Concern Present (10/06/2022)   [employer] of Occupational Health - Occupational Stress Questionnaire    Feeling of Stress : To some extent  Social Connections: Moderately Isolated (10/06/2022)   Social Connection and Isolation Panel [NHANES]    Frequency of Communication with Friends and Family: Twice a week    Frequency of Social Gatherings with Friends and Family: Once a week    Attends Religious Services: Never    Database administrator or Organizations: No    Attends Banker Meetings: Never    Marital Status: Married  Careers information officer Violence: Not At Risk (01/14/2023)   Humiliation, Afraid, Rape, and Kick questionnaire    Fear of Current or Ex-Partner: No    Emotionally Abused: No    Physically Abused: No    Sexually Abused: No    Family History  Problem Relation Age of Onset   Colon polyps Mother    Suicidality Father    Hypertension Sister    Colon cancer Maternal Grandmother    Cancer Maternal Grandmother    Migraines Daughter    Drug abuse Other     Allergies as of 02/01/2023 - Review Complete 02/01/2023  Allergen Reaction Noted   Demerol [meperidine hcl] Anaphylaxis 07/08/2017   Meperidine Anaphylaxis 05/30/2019   Aspirin Other (See Comments) 07/08/2017   Nsaids  07/24/2022  Current Outpatient Medications on File Prior to Encounter  Medication Sig Dispense Refill   acetaminophen (TYLENOL) 500 MG tablet Take by mouth.     ARIPiprazole (ABILIFY) 5 MG tablet TAKE 1 TABLET(5 MG) BY MOUTH DAILY 90 tablet 3   buPROPion (WELLBUTRIN XL) 300 MG 24 hr tablet TAKE 1 TABLET(300 MG) BY MOUTH DAILY 90 tablet 0   busPIRone (BUSPAR) 10 MG tablet TAKE 1 TABLET(10 MG) BY MOUTH TWICE DAILY (Patient taking differently: Take 10 mg by mouth daily.) 180 tablet 0   Docusate Sodium (COLACE PO) Take by mouth.     Ferrous Sulfate (IRON PO) Take by mouth.     hydrOXYzine (VISTARIL) 25 MG capsule Take 1 capsule (25 mg total) by mouth every 8 (eight) hours as needed for anxiety. 90 capsule 2   Multiple Vitamin (MULTIVITAMIN) tablet Take 1 tablet by mouth daily.     ondansetron (ZOFRAN) 4 MG tablet Take 4 mg by mouth every 6 (six) hours as needed for nausea or vomiting.     pantoprazole (PROTONIX) 20 MG tablet Take by mouth.     traMADol (ULTRAM) 50 MG tablet Take 50 mg by mouth every 6 (six) hours as needed.     VITAMIN D PO Take by mouth.     No current facility-administered medications on file prior to encounter.   REVIEW OF SYSTEMS:  Review of Systems  Respiratory:  Negative for shortness of breath.    Cardiovascular:  Negative for chest pain, palpitations and leg swelling.  Musculoskeletal:  Negative for myalgias.  Neurological:  Negative for dizziness and tingling.   PHYSICAL EXAMINATION:  Vitals:   02/01/23 1442  BP: 125/64  Pulse: 93  SpO2: 98%   Physical Exam Vitals reviewed.  Cardiovascular:     Rate and Rhythm: Normal rate.  Pulmonary:     Effort: Pulmonary effort is normal.  Musculoskeletal:        General: No tenderness.  Skin:    Findings: No bruising or erythema.  Psychiatric:        Mood and Affect: Mood normal.        Behavior: Behavior normal.        Thought Content: Thought content normal.   Villalta Score for Post-Thrombotic Syndrome: Pain: Mild Cramps: Absent Heaviness: Mild Paresthesia: Mild Pruritus: Absent Pretibial Edema: Mild Skin Induration: Absent Hyperpigmentation: Absent Redness: Absent Venous Ectasia: Absent Pain on calf compression: Absent Villalta Preliminary Score: 4 Is venous ulcer present?: No If venous ulcer is present and score is <15, then 15 points total are assigned: Absent Villalta Total Score: 4  LABS:  CBC     Component Value Date/Time   WBC 5.9 01/08/2023 1557   WBC 8.7 04/13/2019 2332   RBC 3.86 01/08/2023 1557   RBC 4.27 04/13/2019 2332   HGB 9.4 (L) 01/08/2023 1557   HCT 30.5 (L) 01/08/2023 1557   PLT 327 01/08/2023 1557   MCV 79 01/08/2023 1557   MCH 24.4 (L) 01/08/2023 1557   MCH 29.3 04/13/2019 2332   MCHC 30.8 (L) 01/08/2023 1557   MCHC 31.5 04/13/2019 2332   RDW 13.6 01/08/2023 1557   LYMPHSABS 1.9 01/08/2023 1557   MONOABS 0.8 04/13/2019 2332   EOSABS 0.1 01/08/2023 1557   BASOSABS 0.1 01/08/2023 1557    Hepatic Function      Component Value Date/Time   PROT 6.4 01/08/2023 1557   ALBUMIN 4.1 01/08/2023 1557   AST 19 01/08/2023 1557   ALT 32 01/08/2023 1557   ALKPHOS 85 01/08/2023  1557   BILITOT 0.3 01/08/2023 1557   BILIDIR 0.1 07/08/2017 1010   IBILI 0.4 07/08/2017 1010    Renal  Function   Lab Results  Component Value Date   CREATININE 0.95 01/08/2023   CREATININE 0.79 04/13/2019   CREATININE 1.00 07/08/2017    CrCl cannot be calculated (Patient's most recent lab result is older than the maximum 21 days allowed.).   No concerns on labs in CareEverywhere from 04/24/22: -Scr 0.84, eGFR 87, LFTs wnl, Hgb 13.6, plt 293K  VVS Vascular Lab Studies:  11/02/22 VAS [place] LOWER EXTREMITY VENOUS (DVT)RIGHT  Summary:  RIGHT:  - Findings consistent with acute deep vein thrombosis involving the right  posterior tibial veins, and right peroneal veins.  - No cystic structure found in the popliteal fossa.   LEFT:  - No evidence of common femoral vein obstruction.   ASSESSMENT: Location of DVT: Right distal vein Cause of DVT: provoked by a transient risk factor - s/p right achilles and peroneal tendon surgery on 09/17/22. She developed RLE pain and swelling about 1 month later. Xarelto was started 11/02/22 with plans for 3 months of anticoagulation for a first provoked DVT. She has now completed her 3 months of Xarelto. RLE pain and swelling have continued to improve. She did experience excessive menstrual bleeding while on Xarelto. Otherwise, no abnormal bleeding. No medication access issues and she was able to use the copay card to reduce her monthly costs. Will discontinue Xarelto at this time. She will continue to follow with hematology and gynecology for follow up of anemia/heavy menstrual bleeding.   PLAN: -Patient is discharged from the DVT Clinic. -Discontinue anticoagulation with Xarelto, as patient has completed 3 months of treatment for provoked DVT.  -Counseled patient on future VTE risk reduction strategies and to inform all future providers of DVT history.  Follow up: No further follow up needed in DVT Clinic at this time. Patient is welcome to reach out if any questions or concerns arise.   Pervis Hocking, PharmD, [place]y [place], CPP Deep Vein Thrombosis Clinic Clinical  Pharmacist Practitioner Office: [phone]

## 2023-02-01 NOTE — Patient Instructions (Signed)
[facility]  Discharge Instructions: Thank you for choosing [facility] to provide your oncology and hematology care.  If you have a lab appointment with the Cancer Center - please note that after April 8th, 2024, all labs will be drawn in the cancer center.  You do not have to check in or register with the main entrance as you have in the past but will complete your check-in in the cancer center.  Wear comfortable clothing and clothing appropriate for easy access to any Portacath or PICC line.   We strive to give you quality time with your provider. You may need to reschedule your appointment if you arrive late (15 or more minutes).  Arriving late affects you and other patients whose appointments are after yours.  Also, if you miss three or more appointments without notifying the office, you may be dismissed from the clinic at the provider's discretion.      For prescription refill requests, have your pharmacy contact our office and allow 72 hours for refills to be completed.    Today you received the following chemotherapy and/or immunotherapy agents feraheme. Ferumoxytol Injection What is this medication? FERUMOXYTOL (FER ue MOX i tol) treats low levels of iron in your body (iron deficiency anemia). Iron is a mineral that plays an important role in making red blood cells, which carry oxygen from your lungs to the rest of your body. This medicine may be used for other purposes; ask your health care provider or pharmacist if you have questions. COMMON BRAND NAME(S): Feraheme What should I tell my care team before I take this medication? They need to know if you have any of these conditions: Anemia not caused by low iron levels High levels of iron in the blood Magnetic resonance imaging (MRI) test scheduled An unusual or allergic reaction to iron, other medications, foods, dyes, or preservatives Pregnant or trying to get pregnant Breastfeeding How should I  use this medication? This medication is injected into a vein. It is given by your care team in a hospital or clinic setting. Talk to your care team the use of this medication in children. Special care may be needed. Overdosage: If you think you have taken too much of this medicine contact a poison control center or emergency room at once. NOTE: This medicine is only for you. Do not share this medicine with others. What if I miss a dose? It is important not to miss your dose. Call your care team if you are unable to keep an appointment. What may interact with this medication? Other iron products This list may not describe all possible interactions. Give your health care provider a list of all the medicines, herbs, non-prescription drugs, or dietary supplements you use. Also tell them if you smoke, drink alcohol, or use illegal drugs. Some items may interact with your medicine. What should I watch for while using this medication? Visit your care team regularly. Tell your care team if your symptoms do not start to get better or if they get worse. You may need blood work done while you are taking this medication. You may need to follow a special diet. Talk to your care team. Foods that contain iron include: whole grains/cereals, dried fruits, beans, or peas, leafy green vegetables, and organ meats (liver, kidney). What side effects may I notice from receiving this medication? Side effects that you should report to your care team as soon as possible: Allergic reactions--skin rash, itching, hives, swelling  of the face, lips, tongue, or throat Low blood pressure--dizziness, feeling faint or lightheaded, blurry vision Shortness of breath Side effects that usually do not require medical attention (report to your care team if they continue or are bothersome): Flushing Headache Joint pain Muscle pain Nausea Pain, redness, or irritation at injection site This list may not describe all possible side  effects. Call your doctor for medical advice about side effects. You may report side effects to FDA at 1-800-FDA-1088. Where should I keep my medication? This medication is given in a hospital or clinic. It will not be stored at home. NOTE: This sheet is a summary. It may not cover all possible information. If you have questions about this medicine, talk to your doctor, pharmacist, or health care provider.  2024 Elsevier/Gold Standard (2022-10-02 00:00:00)       To help prevent nausea and vomiting after your treatment, we encourage you to take your nausea medication as directed.  BELOW ARE SYMPTOMS THAT SHOULD BE REPORTED IMMEDIATELY: *FEVER GREATER THAN 100.4 F (38 C) OR HIGHER *CHILLS OR SWEATING *NAUSEA AND VOMITING THAT IS NOT CONTROLLED WITH YOUR NAUSEA MEDICATION *UNUSUAL SHORTNESS OF BREATH *UNUSUAL BRUISING OR BLEEDING *URINARY PROBLEMS (pain or burning when urinating, or frequent urination) *BOWEL PROBLEMS (unusual diarrhea, constipation, pain near the anus) TENDERNESS IN MOUTH AND THROAT WITH OR WITHOUT PRESENCE OF ULCERS (sore throat, sores in mouth, or a toothache) UNUSUAL RASH, SWELLING OR PAIN  UNUSUAL VAGINAL DISCHARGE OR ITCHING   Items with * indicate a potential emergency and should be followed up as soon as possible or go to the Emergency Department if any problems should occur.  Please show the CHEMOTHERAPY ALERT CARD or IMMUNOTHERAPY ALERT CARD at check-in to the Emergency Department and triage nurse.  Should you have questions after your visit or need to cancel or reschedule your appointment, please contact [facility] AT [facility] [phone]  and follow the prompts.  Office hours are 8:00 a.m. to 4:30 p.m. Monday - Friday. Please note that voicemails left after 4:00 p.m. may not be returned until the following business day.  We are closed weekends and major holidays. You have access to a nurse at all times for urgent questions. Please call the main number  to the clinic [phone] and follow the prompts.  For any non-urgent questions, you may also contact your provider using MyChart. We now offer e-Visits for anyone 19 and older to request care online for non-urgent symptoms. For details visit [email or web address].   Also download the MyChart app! Go to the app store, search "MyChart", open the app, select [place], and log in with your MyChart username and password.

## 2023-02-01 NOTE — Progress Notes (Signed)
Patient presents today for Feraheme infusion. Vitals signs stable. Patient states after last iron infusion she experienced nauseu and took Zofran at home.   Feraheme given today per MD orders. Tolerated infusion without adverse affects. Vital signs stable. No complaints at this time. Discharged from clinic ambulatory in stable condition. Alert and oriented x 3. F/U with [facility] as scheduled.

## 2023-02-01 NOTE — Patient Instructions (Signed)
You have been discharged from the DVT Clinic! No further follow up in the DVT Clinic is needed.  -Stop taking Xarelto as you have completed 3 months.  -Please reach out if any questions come up. [phone] [facility].

## 2023-02-02 ENCOUNTER — Ambulatory Visit (HOSPITAL_COMMUNITY): Payer: BC Managed Care – PPO

## 2023-02-04 ENCOUNTER — Other Ambulatory Visit: Payer: Self-pay | Admitting: Family Medicine

## 2023-02-04 DIAGNOSIS — F419 Anxiety disorder, unspecified: Secondary | ICD-10-CM

## 2023-02-10 ENCOUNTER — Other Ambulatory Visit (HOSPITAL_COMMUNITY)
Admission: RE | Admit: 2023-02-10 | Discharge: 2023-02-10 | Disposition: A | Discharge status: Home / Self Care | Payer: BC Managed Care – PPO | Source: Ambulatory Visit | Attending: Obstetrics & Gynecology | Admitting: Obstetrics & Gynecology

## 2023-02-10 ENCOUNTER — Encounter: Payer: Self-pay | Admitting: Obstetrics & Gynecology

## 2023-02-10 ENCOUNTER — Ambulatory Visit: Payer: BC Managed Care – PPO | Admitting: Obstetrics & Gynecology

## 2023-02-10 VITALS — BP 124/76 | HR 69 | Ht 60.0 in | Wt 212.2 lb

## 2023-02-10 DIAGNOSIS — Z30430 Encounter for insertion of intrauterine contraceptive device: Secondary | ICD-10-CM

## 2023-02-10 DIAGNOSIS — Z86718 Personal history of other venous thrombosis and embolism: Secondary | ICD-10-CM

## 2023-02-10 DIAGNOSIS — N921 Excessive and frequent menstruation with irregular cycle: Secondary | ICD-10-CM | POA: Insufficient documentation

## 2023-02-10 DIAGNOSIS — N920 Excessive and frequent menstruation with regular cycle: Secondary | ICD-10-CM | POA: Diagnosis not present

## 2023-02-10 MED ORDER — LEVONORGESTREL 20 MCG/DAY IU IUD
1.0000 | INTRAUTERINE_SYSTEM | Freq: Once | INTRAUTERINE | Status: AC
Start: 2023-02-10 — End: 2023-02-10
  Administered 2023-02-10: 1 via INTRAUTERINE

## 2023-02-10 NOTE — Progress Notes (Signed)
GYN VISIT Patient name: Patricia Becker MRN [id number]  Date of birth: [birth year] Chief Complaint:   Follow-up (Wants to have an ablation)  History of Present Illness:   Patricia Becker is a 47 y.o. [phone] female being seen today for the following concerns:     AUB: Today she notes that this has been going on since about May/June when she was started on xarelto.  Menses are typically every month, sometimes more frequent- every 2 weeks and lasting a lot longer at most up to 11-14 days.  Moderate to heavy bleeding with passage of plum-sized clots.  Typically for a day will soak through pads every hour, then will lighten up a bit to every couple of hours.    She has recently stopped the xarelto.  Prior h/o DVT likely due to prior ankle surgery- was on xarelto x 3 mos  Pelvic US 05/2022: 10.4 x 5.5 x 6.0 cm = volume: 178 mL. Anteverted. Heterogeneous myometrium. 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 No additional masses. Multiple nabothian cysts at cervix.    One prior C-section, vaginal delivery x 2.  She denies any other acute concerns.  Patient's last menstrual period was 01/20/2023.   Review of Systems:   Pertinent items are noted in HPI Denies fever/chills, dizziness, headaches, visual disturbances, fatigue, shortness of breath, chest pain, abdominal pain, vomiting. Pertinent History Reviewed:   Past Surgical History:  Procedure Laterality Date   ANKLE SURGERY Right 09/17/2022   CESAREAN SECTION     COLONOSCOPY     2017, normal.    ESOPHAGOGASTRODUODENOSCOPY     KNEE SURGERY  1981   TUBAL LIGATION      Past Medical History:  Diagnosis Date   Anxiety    Anxiety    Depression    Depression    Diverticulosis    Encounter for screening for HIV 07/08/2017   Hiatal hernia    Iron deficiency anemia    Polypharmacy 07/08/2017   Reviewed problem list, medications and allergies. Physical Assessment:   Vitals:   02/10/23 1118  BP: 124/76   Pulse: 69  Weight: 212 lb 3.2 oz (96.3 kg)  Height: 5' (1.524 m)  Body mass index is 41.44 kg/m.       Physical Examination:   General appearance: alert, well appearing, and in no distress  Psych: mood appropriate, normal affect  Skin: warm & dry   Cardiovascular: normal heart rate noted  Respiratory: normal respiratory effort, no distress  Abdomen: obese, soft, non-tender   Pelvic: VULVA: normal appearing vulva with no masses, tenderness or lesions, VAGINA: normal appearing vagina with normal color and discharge, no lesions, CERVIX: normal appearing cervix without discharge or lesions  Extremities: no edema   Chaperone: Faith Rogue    Endometrial Biopsy Procedure Note  Pre-operative Diagnosis: AUB  Post-operative Diagnosis: same  Procedure Details   Urine pregnancy test was negative.  The risks (including infection, bleeding, pain, and uterine perforation) and benefits of the procedure were explained to the patient and Written informed consent was obtained.     The patient was placed in the dorsal lithotomy position.  Bimanual exam showed the uterus to be in the neutral position.  A speculum inserted in the vagina, and the cervix prepped with betadine.     A single tooth tenaculum was applied to the anterior lip of the cervix for stabilization.  Os finder was used.  A Pipelle endometrial aspirator was used to sample the endometrium.  Sample was sent  for pathologic examination.  The uterus was sounded to 9 cm.  Mirena  IUD placed per manufacturer's recommendations. The strings were trimmed to approximately 3 cm. The patient tolerated the procedure well.    Condition: Stable  Complications: None    Assessment & Plan:  1) AUB, IUD insertion -Due to prior history patient is a candidate for progesterone only options -Discussed all options including Pops, Depo, IUD or surgical intervention such as endometrial ablation or hysterectomy -EMB obtained to rule out underlying  etiology -After review of all options, questions and concerns were addressed and wishes to proceed with IUD -Mirena placed without difficulty, f/u in 2-3 mos  Return in 3 months (on 05/13/2023) for 2-3 IUD /HMB follow up.   Myna Hidalgo, DO Attending Obstetrician & Gynecologist, [facility] for [employer], [facility]

## 2023-02-12 ENCOUNTER — Other Ambulatory Visit: Payer: Self-pay | Admitting: Family Medicine

## 2023-02-12 DIAGNOSIS — F419 Anxiety disorder, unspecified: Secondary | ICD-10-CM

## 2023-02-15 LAB — SURGICAL PATHOLOGY

## 2023-02-18 ENCOUNTER — Other Ambulatory Visit: Payer: Self-pay | Admitting: Family Medicine

## 2023-02-18 DIAGNOSIS — F419 Anxiety disorder, unspecified: Secondary | ICD-10-CM

## 2023-02-19 ENCOUNTER — Other Ambulatory Visit: Payer: Self-pay | Admitting: Family Medicine

## 2023-02-19 ENCOUNTER — Ambulatory Visit: Payer: BC Managed Care – PPO | Admitting: Nurse Practitioner

## 2023-02-19 ENCOUNTER — Encounter: Payer: Self-pay | Admitting: Nurse Practitioner

## 2023-02-19 VITALS — BP 116/78 | HR 70 | Temp 97.9°F | Ht 60.0 in | Wt 215.6 lb

## 2023-02-19 DIAGNOSIS — F32A Depression, unspecified: Secondary | ICD-10-CM

## 2023-02-19 DIAGNOSIS — R1013 Epigastric pain: Secondary | ICD-10-CM | POA: Diagnosis not present

## 2023-02-19 DIAGNOSIS — K8020 Calculus of gallbladder without cholecystitis without obstruction: Secondary | ICD-10-CM | POA: Insufficient documentation

## 2023-02-19 DIAGNOSIS — K219 Gastro-esophageal reflux disease without esophagitis: Secondary | ICD-10-CM | POA: Diagnosis not present

## 2023-02-19 MED ORDER — ONDANSETRON HCL 4 MG PO TABS: 4.0000 mg | ORAL_TABLET | Freq: Four times a day (QID) | ORAL | 0 refills | Status: DC | PRN

## 2023-02-19 MED ORDER — PANTOPRAZOLE SODIUM 40 MG PO TBEC
40.0000 mg | DELAYED_RELEASE_TABLET | Freq: Every day | ORAL | 0 refills | Status: DC
Start: 2023-02-19 — End: 2023-03-01

## 2023-02-19 NOTE — Patient Instructions (Signed)
SAD-I

## 2023-02-20 ENCOUNTER — Encounter: Payer: Self-pay | Admitting: Nurse Practitioner

## 2023-02-20 DIAGNOSIS — R1013 Epigastric pain: Secondary | ICD-10-CM | POA: Insufficient documentation

## 2023-02-20 DIAGNOSIS — K219 Gastro-esophageal reflux disease without esophagitis: Secondary | ICD-10-CM | POA: Insufficient documentation

## 2023-02-20 NOTE — Progress Notes (Signed)
Subjective:    Patient ID: Nouf Sipe, female    DOB: [birth year], 47 y.o.   MRN: [id number]  HPI Presents for complaints of abdominal pain that began 2 days ago.  Has symptoms about once a month, this 1 was more severe than usual.  Nausea and vomiting.  Pain in the upper epigastric area into the lower sternal area.  Describes it as a feeling of taking her breath away in the upper abdomen.  Slight relief with Carafate Zofran and Protonix.  Does have a history of GERD.  No fevers.  No known contacts.  Symptoms have improved over the past 48 hours.  Has had an EGD in the past showing a hiatal hernia.  Had an ultrasound before that showed gallstones.  No back or shoulder pain. Minimal caffeine intake.  Denies alcohol tobacco or NSAID use.  Takes Protonix 20 mg daily as needed.  Had some mild constipation with her iron supplement but this is resolved with OTC meds.  Stools are dark because of the iron.   Review of Systems  Constitutional:  Negative for fever.  Respiratory:  Negative for cough, chest tightness and shortness of breath.   Cardiovascular:  Negative for chest pain.  Gastrointestinal:  Positive for abdominal pain, nausea and vomiting. Negative for constipation and diarrhea.       Objective:   Physical Exam NAD.  Alert, oriented.  Lungs clear.  Heart regular rate rhythm.  Abdomen soft nondistended with active bowel sounds x 4.  Localized tenderness noted in the very upper portion of the epigastric area.  Otherwise abdomen unremarkable.  No tenderness or obvious masses in the right upper quadrant. Today's Vitals   02/19/23 1457  BP: 116/78  Pulse: 70  Temp: 97.9 F (36.6 C)  SpO2: 98%  Weight: 215 lb 9.6 oz (97.8 kg)  Height: 5' (1.524 m)   Body mass index is 42.11 kg/m.  Abdominal ultrasound dated 04/24/2022 shows calculus of the gallbladder.       Assessment & Plan:   Problem List Items Addressed This Visit       Digestive   Calculus of gallbladder without  cholecystitis without obstruction   Relevant Orders   Lipase   Hepatic function panel   NM Hepato W/Eject Fract   Gastroesophageal reflux disease without esophagitis   Relevant Medications   pantoprazole (PROTONIX) 40 MG tablet   ondansetron (ZOFRAN) 4 MG tablet   Other Relevant Orders   Ambulatory referral to Gastroenterology     Other   Epigastric pain - Primary   Relevant Orders   Lipase   Hepatic function panel   NM Hepato W/Eject Fract   Ambulatory referral to Gastroenterology   Meds ordered this encounter  Medications   pantoprazole (PROTONIX) 40 MG tablet    Sig: Take 1 tablet (40 mg total) by mouth daily. For acid reflux    Dispense:  90 tablet    Refill:  0    Order Specific Question:   Supervising Provider    Answer:   Lilyan Punt A [[id number]]   ondansetron (ZOFRAN) 4 MG tablet    Sig: Take 1 tablet (4 mg total) by mouth every 6 (six) hours as needed for nausea or vomiting.    Dispense:  20 tablet    Refill:  0    Order Specific Question:   Supervising Provider    Answer:   Lilyan Punt A [[id number]]   Increase Protonix to 40 mg daily. Lipase and hepatic function panels pending.  Patient gets regular CBCs through her hematologist. Discussed options.  Will proceed with a HIDA scan. Also referral sent for GI consult.   Warning signs reviewed.  Recheck immediately if any new or worsening symptoms.

## 2023-02-23 ENCOUNTER — Encounter (INDEPENDENT_AMBULATORY_CARE_PROVIDER_SITE_OTHER): Payer: Self-pay | Admitting: *Deleted

## 2023-02-23 DIAGNOSIS — K8020 Calculus of gallbladder without cholecystitis without obstruction: Secondary | ICD-10-CM | POA: Diagnosis not present

## 2023-02-23 DIAGNOSIS — R1013 Epigastric pain: Secondary | ICD-10-CM | POA: Diagnosis not present

## 2023-02-24 ENCOUNTER — Ambulatory Visit (HOSPITAL_COMMUNITY): Payer: BC Managed Care – PPO

## 2023-02-24 LAB — HEPATIC FUNCTION PANEL
ALT: 23 [IU]/L (ref 0–32)
AST: 12 [IU]/L (ref 0–40)
Albumin: 4 g/dL (ref 3.9–4.9)
Alkaline Phosphatase: 94 [IU]/L (ref 44–121)
Bilirubin Total: <0.2 mg/dL (ref 0.0–1.2)
Bilirubin, Direct: <0.1 mg/dL (ref 0.00–0.40)
Total Protein: 6.1 g/dL (ref 6.0–8.5)

## 2023-02-24 LAB — LIPASE: Lipase: 27 U/L (ref 14–72)

## 2023-03-01 ENCOUNTER — Encounter (INDEPENDENT_AMBULATORY_CARE_PROVIDER_SITE_OTHER): Payer: Self-pay | Admitting: Gastroenterology

## 2023-03-01 ENCOUNTER — Ambulatory Visit (INDEPENDENT_AMBULATORY_CARE_PROVIDER_SITE_OTHER): Payer: BC Managed Care – PPO | Admitting: Gastroenterology

## 2023-03-01 VITALS — BP 115/68 | HR 75 | Temp 97.3°F | Ht 60.0 in | Wt 213.0 lb

## 2023-03-01 DIAGNOSIS — R1013 Epigastric pain: Secondary | ICD-10-CM | POA: Diagnosis not present

## 2023-03-01 DIAGNOSIS — Z6841 Body Mass Index (BMI) 40.0 and over, adult: Secondary | ICD-10-CM | POA: Diagnosis not present

## 2023-03-01 DIAGNOSIS — Z800 Family history of malignant neoplasm of digestive organs: Secondary | ICD-10-CM | POA: Diagnosis not present

## 2023-03-01 DIAGNOSIS — K449 Diaphragmatic hernia without obstruction or gangrene: Secondary | ICD-10-CM | POA: Diagnosis not present

## 2023-03-01 DIAGNOSIS — K219 Gastro-esophageal reflux disease without esophagitis: Secondary | ICD-10-CM

## 2023-03-01 HISTORY — DX: Body Mass Index (BMI) 40.0 and over, adult: Z684

## 2023-03-01 MED ORDER — OMEPRAZOLE 40 MG PO CPDR
40.0000 mg | DELAYED_RELEASE_CAPSULE | Freq: Every day | ORAL | 3 refills | Status: DC
Start: 2023-03-01 — End: 2023-05-24

## 2023-03-01 NOTE — Addendum Note (Signed)
Addended by: Vista Lawman on: 03/01/2023 05:53 PM   Modules accepted: Orders

## 2023-03-01 NOTE — Progress Notes (Addendum)
Vista Lawman , M.D. Gastroenterology & Hepatology [facility] [address] [place], [place] [address] Primary Care Physician: Tommie Sams, DO [address] [place] [address]  Chief Complaint:  Abdominal pain , GERD   History of Present Illness: Patricia Becker is a 47 y.o. female with prior history of DVT due to ankle surgery was on Xarelto was 3 months , severe IDA due to menorrhagia (ferritin 5) follows with hematology recently stopped who presents for evaluation of Abdominal pain   Patient reports that for past few months she has these recurrent episodes of midepigastric pain, nonradiating, sharp stabbing in nature, sudden onset, intermittent in nature associated with blood patient reports as " breath taking'  Patient reports that pain is usually after meals but can also come randomly.  No relieving factors.  There is no relationship with with defecation.  She has a Bristol stool scale 4 bowel movement daily without straining.  She is on iron supplementation and has been taking laxatives with it . Patient reports that she had these pains even before being diagnosed with DVT many months before that  Patient had a provoked right posterior tibial vein DVT 10/2022 and was on Xarelto for 3 months.  Patient has menorrhagia complicated with severe iron deficiency anemia ferritin 5 hemoglobin 9.4 was started on IV iron infusion by hematology   The patient denies having any nausea, vomiting, fever, chills, hematochezia, melena, hematemesis,  diarrhea, jaundice, pruritus or weight loss.  Last EGD/ Colonoscopy:06/2022 [facility]  INDICATIONS:  EPIGASTRIC ABDOMINAL PAIN  IMPRESSIONS:  - NORMAL ESOPHAGUS.  - Z-LINE REGULAR, 33 CM FROM THE INCISORS.  - 3 CM HIATAL HERNIA.  - NORMAL STOMACH.  BIOPSIED.   - NORMAL EXAMINED DUODENUM.  INDICATIONS:  COLON CANCER SCREENING IN PATIENT AT INCREASED RISK: FAMILY HISTORY OF  1ST-DEGREE   RELATIVE WITH COLON POLYPS BEFORE AGE 32 YEARS   IMPRESSIONS:  - THREE 2 TO 3 MM POLYPS IN THE SIGMOID COLON, REMOVED WITH A COLD SNARE.  RESECTED AND RETRIEVED.  - DIVERTICULOSIS IN THE SIGMOID COLON AND IN THE TRANSVERSE COLON.  - NON-BLEEDING INTERNAL HEMORRHOIDS.   SOURCE/SPECIMEN  1. STOMACH, RANDOM GASTRIC -BIOPSY  2. COLON, SIGMOID COLON-POLYPECTOMY   1. STOMACH, RANDOM GASTRIC -BIOPSY:  CHRONIC INACTIVE GASTRITIS. NEGATIVE FOR H.PYLORI ON H&E STAIN.   2. COLON, SIGMOID COLON-POLYPECTOMY:  HYPERPLASTIC POLYPS (2).   QMV:HQIONGEX grandmother had ovarian cancer.  Social: neg smoking, alcohol or illicit drug use Surgical: no abdominal surgeries  Past Medical History: Past Medical History:  Diagnosis Date   Anxiety    Anxiety    Depression    Depression    Diverticulosis    Encounter for screening for HIV 07/08/2017   Hiatal hernia    Iron deficiency anemia    Polypharmacy 07/08/2017    Past Surgical History: Past Surgical History:  Procedure Laterality Date   ANKLE SURGERY Right 09/17/2022   CESAREAN SECTION     COLONOSCOPY     2017, normal.    ESOPHAGOGASTRODUODENOSCOPY     KNEE SURGERY  1981   TUBAL LIGATION      Family History: Family History  Problem Relation Age of Onset   Colon polyps Mother    Suicidality Father    Hypertension Sister    Colon cancer Maternal Grandmother    Cancer Maternal Grandmother    Migraines Daughter    Drug abuse Other     Social History: Social History   Tobacco  Use  Smoking Status Never   Passive exposure: Yes  Smokeless Tobacco Never   Social History   Substance and Sexual Activity  Alcohol Use Yes   Alcohol/week: 2.0 standard drinks of alcohol   Types: 2 Glasses of wine per week   Comment: very rarely   Social History   Substance and Sexual Activity  Drug Use No    Allergies: Allergies  Allergen Reactions   Demerol [Meperidine Hcl] Anaphylaxis   Meperidine Anaphylaxis   Aspirin Other (See  Comments)    Upset stomach   Nsaids     Pt reports has had "gi issues" lately and was told not to have steroid or NSAIDS prescribed at this time.    Medications: Current Outpatient Medications  Medication Sig Dispense Refill   acetaminophen (TYLENOL) 500 MG tablet Take by mouth.     ARIPiprazole (ABILIFY) 5 MG tablet TAKE 1 TABLET(5 MG) BY MOUTH DAILY 90 tablet 0   buPROPion (WELLBUTRIN XL) 300 MG 24 hr tablet TAKE 1 TABLET(300 MG) BY MOUTH DAILY 90 tablet 0   busPIRone (BUSPAR) 10 MG tablet TAKE 1 TABLET(10 MG) BY MOUTH TWICE DAILY 180 tablet 0   Docusate Sodium (COLACE PO) Take by mouth.     Ferrous Sulfate (IRON PO) Take by mouth daily.     hydrOXYzine (VISTARIL) 25 MG capsule Take 1 capsule (25 mg total) by mouth every 8 (eight) hours as needed for anxiety. 90 capsule 2   Multiple Vitamin (MULTIVITAMIN) tablet Take 1 tablet by mouth daily.     omeprazole (PRILOSEC) 40 MG capsule Take 1 capsule (40 mg total) by mouth daily. 90 capsule 3   ondansetron (ZOFRAN) 4 MG tablet Take 1 tablet (4 mg total) by mouth every 6 (six) hours as needed for nausea or vomiting. 20 tablet 0   VITAMIN D PO Take by mouth.     No current facility-administered medications for this visit.    Review of Systems: GENERAL: negative for malaise, night sweats HEENT: No changes in hearing or vision, no nose bleeds or other nasal problems. NECK: Negative for lumps, goiter, pain and significant neck swelling RESPIRATORY: Negative for cough, wheezing CARDIOVASCULAR: Negative for chest pain, leg swelling, palpitations, orthopnea GI: SEE HPI MUSCULOSKELETAL: Negative for joint pain or swelling, back pain, and muscle pain. SKIN: Negative for lesions, rash HEMATOLOGY Negative for prolonged bleeding, bruising easily, and swollen nodes. ENDOCRINE: Negative for cold or heat intolerance, polyuria, polydipsia and goiter. NEURO: negative for tremor, gait imbalance, syncope and seizures. The remainder of the review of  systems is noncontributory.   Physical Exam: BP 115/68 (BP Location: Right Arm, Patient Position: Sitting, Cuff Size: Large)   Pulse 75   Temp (!) 97.3 F (36.3 C) (Oral)   Ht 5' (1.524 m)   Wt 213 lb (96.6 kg)   BMI 41.60 kg/m  GENERAL: The patient is AO x3, in no acute distress. HEENT: Head is normocephalic and atraumatic. EOMI are intact. Mouth is well hydrated and without lesions. NECK: Supple. No masses LUNGS: Clear to auscultation. No presence of rhonchi/wheezing/rales. Adequate chest expansion HEART: RRR, normal s1 and s2. ABDOMEN: Soft, nontender, no guarding, no peritoneal signs, and nondistended. BS +. No masses.  Imaging/Labs: as above     Latest Ref Rng & Units 01/08/2023    3:57 PM 04/13/2019   11:32 PM 07/08/2017   10:10 AM  CBC  WBC 3.4 - 10.8 x10E3/uL 5.9  8.7  6.5   Hemoglobin 11.1 - 15.9 g/dL 9.4  12.5  14.1   Hematocrit 34.0 - 46.6 % 30.5  39.7  41.9   Platelets 150 - 450 x10E3/uL 327  234  253    Lab Results  Component Value Date   IRON 14 (L) 01/08/2023   TIBC 446 01/08/2023   FERRITIN 5 (L) 01/08/2023   IMPRESSION:   Cholelithiasis.   No gallbladder wall thickening or pericholecystic fluid.   Common bile duct measuring 0.8 cm.   I personally reviewed and interpreted the available labs, imaging and endoscopic files.  Impression and Plan:   Patricia Becker is a 47 y.o. female with prior history of DVT due to ankle surgery was on Xarelto was 3 months , severe IDA due to menorrhagia (ferritin 5) follows with hematology recently stopped who presents for evaluation of Abdominal pain   #Abdominal pain   Epigastric abdominal pain mostly postprandial, although patient says concomitant, either biliary colic or GERD  Patient had an upper endoscopy February this year reported to only have hernia biopsies were negative hence not peptic ulcer disease  Also pain was present many months before being diagnosed with DVT hence unlikely PE. But patient  advised to go to ER if she is short of breath or any severe pain  Abdominal ultrasound done last year with cholelithiasis and has intermittent postprandial abdominal pain likely biliary in origin.  Patient is scheduled for HIDA scan tomorrow.  Depending on the results can be referred to general surgery for evaluation  Also pain severity improves with PPI and hence with underlying 3 cm hiatal hernia could be referred pain from GERD  Another differential is esophageal spasm or motility disorder.    Recs:  -Will obtain esophagogram Recommend obtaining HIDA scan Will change Protonix to omeprazole 40 mg 30 to 60 minutes before breakfast daily 1) Avoid coffee, tea, cola beverages, carbonated beverages, spicy foods, greasy foods, foods high in acid content (e.g. tomatoes and citrus fruits), chocolate, and peppermint 2) Eat small meals and keep weight within normal range 3) Avoid recumbent posture for 3 hours post-prandially 4) Elevate head of bed  #Family history of CRC   Patient reports first-degree relatives with colon cancer less than age 82.  Is up-to-date to colonoscopy 02/ 2024 reported to have 2 hyperplastic polyp.  Recommend repeat in 5 years  #BMI 41       - walking at a brisk pace/biking at moderate intesity 2.5-5 hours per week     - use pedometer/step counter to track activity     - goal to lose 5-10% of initial body weight     - avoid suagry drinks and juices, use zero calorie beverages     - increase water intake     - eat a low carb diet with plenty of veggies and fruit     - Get sufficient sleep 7-8 hrs nightly     - maitain active lifestyle     - avoid alcohol     - recommend 2-3 cups Coffee daily     - Counsel on lowering cholesterol by having a diet rich in vegetables,          protein (avoid red meats) and good fats(fish, salmon)     All questions were answered.      Vista Lawman, MD Gastroenterology and Hepatology [facility]  Gastroenterology   This chart has been completed using [facility] Dictation software, and while attempts have been made to ensure accuracy , certain words and phrases may not be transcribed as  intended

## 2023-03-01 NOTE — Patient Instructions (Signed)
It was very nice to meet you today, as dicussed with will plan for the following :  1) obtain results of HIDA scan

## 2023-03-02 ENCOUNTER — Encounter (HOSPITAL_COMMUNITY)
Admission: RE | Admit: 2023-03-02 | Discharge: 2023-03-02 | Disposition: A | Discharge status: Home / Self Care | Payer: BC Managed Care – PPO | Source: Ambulatory Visit | Attending: Nurse Practitioner | Admitting: Nurse Practitioner

## 2023-03-02 ENCOUNTER — Encounter (HOSPITAL_COMMUNITY): Payer: Self-pay

## 2023-03-02 DIAGNOSIS — K8020 Calculus of gallbladder without cholecystitis without obstruction: Secondary | ICD-10-CM | POA: Insufficient documentation

## 2023-03-02 DIAGNOSIS — R1013 Epigastric pain: Secondary | ICD-10-CM | POA: Diagnosis not present

## 2023-03-02 MED ORDER — STERILE WATER FOR INJECTION IJ SOLN
INTRAMUSCULAR | Status: AC
  Filled 2023-03-02: qty 10

## 2023-03-02 MED ORDER — SINCALIDE 5 MCG IJ SOLR
INTRAMUSCULAR | Status: AC
  Filled 2023-03-02: qty 5

## 2023-03-02 MED ORDER — TECHNETIUM TC 99M MEBROFENIN IV KIT
5.0000 | PACK | Freq: Once | INTRAVENOUS | Status: AC | PRN
  Administered 2023-03-02: 5.3 via INTRAVENOUS

## 2023-03-02 MED ORDER — STERILE WATER FOR INJECTION IJ SOLN
1.9300 mL | Freq: Once | INTRAMUSCULAR | Status: AC
  Administered 2023-03-02: 1.93 mL via INTRAMUSCULAR

## 2023-03-02 MED ORDER — SINCALIDE 5 MCG IJ SOLR
0.0200 ug/kg | Freq: Once | INTRAMUSCULAR | Status: AC
  Administered 2023-03-02: 1.93 ug via INTRAVENOUS

## 2023-03-03 ENCOUNTER — Encounter: Payer: Self-pay | Admitting: Nurse Practitioner

## 2023-03-03 ENCOUNTER — Encounter (INDEPENDENT_AMBULATORY_CARE_PROVIDER_SITE_OTHER): Payer: Self-pay | Admitting: Gastroenterology

## 2023-03-03 ENCOUNTER — Encounter: Payer: Self-pay | Admitting: Obstetrics & Gynecology

## 2023-03-04 ENCOUNTER — Telehealth (INDEPENDENT_AMBULATORY_CARE_PROVIDER_SITE_OTHER): Payer: Self-pay | Admitting: *Deleted

## 2023-03-04 NOTE — Telephone Encounter (Signed)
Referral sent, they will contact patient with apt

## 2023-03-04 NOTE — Telephone Encounter (Signed)
-----   Message from Franky Macho sent at 03/04/2023  8:39 AM EDT ----- Regarding: surgery referral Hi Brooklynn Brandenburg Can you please refer this patient for Cholecystectomy to our surgeons here

## 2023-03-11 ENCOUNTER — Encounter: Payer: Self-pay | Admitting: Surgery

## 2023-03-11 ENCOUNTER — Ambulatory Visit: Payer: BC Managed Care – PPO | Admitting: Surgery

## 2023-03-11 VITALS — BP 143/85 | HR 62 | Temp 97.5°F | Resp 16 | Ht 60.0 in | Wt 214.0 lb

## 2023-03-11 DIAGNOSIS — K8020 Calculus of gallbladder without cholecystitis without obstruction: Secondary | ICD-10-CM | POA: Diagnosis not present

## 2023-03-11 DIAGNOSIS — K828 Other specified diseases of gallbladder: Secondary | ICD-10-CM | POA: Diagnosis not present

## 2023-03-11 NOTE — Progress Notes (Signed)
[facility] History and Physical  Reason for Referral: Chronic cholecystitis, biliary dyskinesia Referring Physician: Dr. Tasia Catchings  Chief Complaint   New Patient (Initial Visit)     Patricia Becker is a 47 y.o. female.  HPI: Patient presents for evaluation of cholelithiasis and chronic cholecystitis.  Starting a year ago, she started having small upper abdominal pain and concern for gallbladder attacks.  She underwent a right upper quadrant ultrasound last December and it demonstrated cholelithiasis.  She subsequently underwent a HIDA scan this month, which demonstrated concern for biliary dyskinesia/chronic cholecystitis.  She will still have some intermittent attacks with the last one being a week ago.  She will have nausea with her attacks, and will intermittently vomit and that seems to help her pain.  She denies nausea and vomiting currently.  She is having regular bowel movements.  Her past medical history significant for anxiety, depression, and iron deficiency anemia.  She had a DVT this past spring after having ankle surgery.  She was on Xarelto for 3 months, but stopped taking this in August.  She denies use of blood thinning medications.  Her surgical history is significant for a laparoscopic tubal ligation and C-section.  She denies use of tobacco products and illicit drugs.  She rarely drinks alcohol.  Past Medical History:  Diagnosis Date   Anxiety    Anxiety    Depression    Depression    Diverticulosis    Encounter for screening for HIV 07/08/2017   Hiatal hernia    Iron deficiency anemia    Polypharmacy 07/08/2017    Past Surgical History:  Procedure Laterality Date   ANKLE SURGERY Right 09/17/2022   CESAREAN SECTION     COLONOSCOPY     2017, normal.    ESOPHAGOGASTRODUODENOSCOPY     KNEE SURGERY  1981   TUBAL LIGATION      Family History  Problem Relation Age of Onset   Colon polyps Mother    Suicidality Father    Hypertension Sister     Colon cancer Maternal Grandmother    Cancer Maternal Grandmother    Migraines Daughter    Drug abuse Other     Social History   Tobacco Use   Smoking status: Never    Passive exposure: Yes   Smokeless tobacco: Never  Vaping Use   Vaping status: Never Used  Substance Use Topics   Alcohol use: Yes    Alcohol/week: 2.0 standard drinks of alcohol    Types: 2 Glasses of wine per week    Comment: very rarely   Drug use: No    Medications: I have reviewed the patient's current medications. Allergies as of 03/11/2023       Reactions   Demerol [meperidine Hcl] Anaphylaxis   Meperidine Anaphylaxis   Aspirin Other (See Comments)   Upset stomach   Nsaids    Pt reports has had "gi issues" lately and was told not to have steroid or NSAIDS prescribed at this time.        Medication List        Accurate as of March 11, 2023 11:34 AM. If you have any questions, ask your nurse or doctor.          acetaminophen 500 MG tablet Commonly known as: TYLENOL Take by mouth.   ARIPiprazole 5 MG tablet Commonly known as: ABILIFY TAKE 1 TABLET(5 MG) BY MOUTH DAILY   buPROPion 300 MG 24 hr tablet Commonly known as: WELLBUTRIN XL TAKE 1 TABLET(300 MG)  BY MOUTH DAILY   busPIRone 10 MG tablet Commonly known as: BUSPAR TAKE 1 TABLET(10 MG) BY MOUTH TWICE DAILY   COLACE PO Take by mouth.   hydrOXYzine 25 MG capsule Commonly known as: VISTARIL Take 1 capsule (25 mg total) by mouth every 8 (eight) hours as needed for anxiety.   IRON PO Take by mouth daily.   multivitamin tablet Take 1 tablet by mouth daily.   omeprazole 40 MG capsule Commonly known as: PRILOSEC Take 1 capsule (40 mg total) by mouth daily.   ondansetron 4 MG tablet Commonly known as: ZOFRAN Take 1 tablet (4 mg total) by mouth every 6 (six) hours as needed for nausea or vomiting.   VITAMIN D PO Take by mouth.         ROS:  Constitutional: negative for chills, fatigue, and fevers Eyes: negative  for visual disturbance and pain Ears, nose, mouth, throat, and face: negative for ear drainage, sore throat, and sinus problems Respiratory: negative for cough, wheezing, and shortness of breath Cardiovascular: negative for chest pain and palpitations Gastrointestinal: positive for abdominal pain, nausea, and reflux symptoms, negative for vomiting Genitourinary:negative for dysuria and frequency Integument/breast: negative for dryness and rash Hematologic/lymphatic: negative for bleeding and lymphadenopathy Musculoskeletal:negative for back pain and neck pain Neurological: negative for dizziness and tremors Endocrine: negative for temperature intolerance  Blood pressure (!) 143/85, pulse 62, temperature (!) 97.5 F (36.4 C), temperature source Oral, resp. rate 16, height 5' (1.524 m), weight 214 lb (97.1 kg), SpO2 98%. Physical Exam Vitals reviewed.  Constitutional:      Appearance: Normal appearance.  HENT:     Head: Normocephalic and atraumatic.  Eyes:     Extraocular Movements: Extraocular movements intact.     Pupils: Pupils are equal, round, and reactive to light.  Cardiovascular:     Rate and Rhythm: Normal rate and regular rhythm.  Pulmonary:     Effort: Pulmonary effort is normal.     Breath sounds: Normal breath sounds.  Abdominal:     Comments: Abdomen soft, nondistended, no percussion tenderness, mild right upper quadrant tenderness to palpation; no rigidity, guarding, rebound tenderness; negative Murphy sign  Musculoskeletal:        General: Normal range of motion.     Cervical back: Normal range of motion.  Skin:    General: Skin is warm and dry.  Neurological:     General: No focal deficit present.     Mental Status: She is alert and oriented to person, place, and time.  Psychiatric:        Mood and Affect: Mood normal.        Behavior: Behavior normal.     Results: Right upper quadrant ultrasound (04/24/2022): IMPRESSION:   Cholelithiasis.   No  gallbladder wall thickening or pericholecystic fluid.   Common bile duct measuring 0.8 cm.   The technologist was unable to properly evaluate for sonographic Murphy sign.   HIDA (03/02/2023): IMPRESSION: Scintigraphic findings compatible with chronic cholecystitis/biliary dyskinesia.  Assessment & Plan:  Patricia Becker is a 47 y.o. female who presents for evaluation of cholelithiasis, chronic cholecystitis/biliary dyskinesia.  -We discussed the pathophysiology of gallbladder disease, and why we recommend surgical excision. -I counseled the patient about the indication, risks and benefits of robotic assisted laparoscopic cholecystectomy.  She understands there is a very small chance for bleeding, infection, injury to normal structures (including common bile duct), conversion to open surgery, persistent symptoms, evolution of postcholecystectomy diarrhea, need for secondary interventions, anesthesia reaction, cardiopulmonary  issues and other risks not specifically detailed here. I described the expected recovery, the plan for follow-up and the restrictions during the recovery phase.  All questions were answered. -Patient tentatively scheduled for surgery on 12/4 -Information provided to the patient regarding cholelithiasis, biliary dyskinesia, cholecystitis, cholecystectomy, and low-fat diet -Advised that she needs to present to the emergency department if she begins to have fever, chills, worsening right upper quadrant abdominal pain, nausea, and vomiting.  All questions were answered to the satisfaction of the patient.  Theophilus Kinds, DO [facility] [address] [place] [place], [place] [address] [phone] (office)

## 2023-03-29 ENCOUNTER — Other Ambulatory Visit: Payer: Self-pay | Admitting: Obstetrics & Gynecology

## 2023-03-29 DIAGNOSIS — N939 Abnormal uterine and vaginal bleeding, unspecified: Secondary | ICD-10-CM

## 2023-03-29 DIAGNOSIS — M216X1 Other acquired deformities of right foot: Secondary | ICD-10-CM | POA: Diagnosis not present

## 2023-03-29 NOTE — Patient Instructions (Signed)
Patricia Becker  03/29/2023     @PREFPERIOPPHARMACY @   Your procedure is scheduled on 04/14/2023.  Report to [facility] at 8:15 A.M.  Call this number if you have problems the morning of surgery:  [phone]  If you experience any cold or flu symptoms such as cough, fever, chills, shortness of breath, etc. between now and your scheduled surgery, please notify us at the above number.   Remember:  Do not eat or anything after midnight.  You may drink clear liquids until 6:15am .  Clear liquids allowed are:                    Water, Carbonated beverages (diabetics please choose diet or no sugar options), Clear Tea (No creamer, milk, or cream, including half & half and powdered creamer), and Clear Sports drink (No red color; diabetics please choose diet or no sugar options)    Take these medicines the morning of surgery with A SIP OF WATER : Abilify Wellbutrin Buspar Prilosec and Zofran if needed.    Do not wear jewelry, make-up or nail polish, including gel polish,  artificial nails, or any other type of covering on natural nails (fingers and  toes).  Do not wear lotions, powders, or perfumes, or deodorant.  Do not shave 48 hours prior to surgery.  Men may shave face and neck.  Do not bring valuables to the hospital.  [facility] is not responsible for any belongings or valuables.  Contacts, dentures or bridgework may not be worn into surgery.  Leave your suitcase in the car.  After surgery it may be brought to your room.  For patients admitted to the hospital, discharge time will be determined by your treatment team.  Patients discharged the day of surgery will not be allowed to drive home.   Name and phone number of your driver:   Family Special instructions:  N/A  Please read over the following fact sheets that you were given. Care and Recovery After Surgery  Minimally Invasive Cholecystectomy Minimally invasive cholecystectomy is surgery to remove the gallbladder. The  gallbladder is a pear-shaped organ that lies beneath the liver on the right side of the body. The gallbladder stores bile, which is a fluid that helps the body digest fats. Cholecystectomy is often done to treat inflammation (irritation and swelling) of the gallbladder (cholecystitis). This condition is usually caused by a buildup of gallstones (cholelithiasis) in the gallbladder or when the fluid in the gall bladder becomes stagnant because gallstones get stuck in the ducts (tubes) and block the flow of bile. This can result in inflammation and pain. In severe cases, emergency surgery may be required. This procedure is done through small incisions in the abdomen, instead of one large incision. It is also called laparoscopic surgery. A thin scope with a camera (laparoscope) is inserted through one incision. Then surgical instruments are inserted through the other incisions. In some cases, a minimally invasive surgery may need to be changed to a surgery that is done through a larger incision. This is called open surgery. Tell a health care provider about: Any allergies you have. All medicines you are taking, including vitamins, herbs, eye drops, creams, and over-the-counter medicines. Any problems you or family members have had with anesthetic medicines. Any bleeding problems you have. Any surgeries you have had. Any medical conditions you have. Whether you are pregnant or may be pregnant. What are the risks? Generally, this is a safe procedure. However, problems may occur,  including: Infection. Bleeding. Allergic reactions to medicines. Damage to nearby structures or organs. A gallstone remaining in the common bile duct. The common bile duct carries bile from the gallbladder to the small intestine. A bile leak from the liver or cystic duct after your gallbladder is removed. What happens before the procedure? When to stop eating and drinking Follow instructions from your health care provider about  what you may eat and drink before your procedure. These may include: 8 hours before the procedure Stop eating most foods. Do not eat meat, fried foods, or fatty foods. Eat only light foods, such as toast or crackers. All liquids are okay except energy drinks and alcohol. 6 hours before the procedure Stop eating. Drink only clear liquids, such as water, clear fruit juice, black coffee, plain tea, and sports drinks. Do not drink energy drinks or alcohol. 2 hours before the procedure Stop drinking all liquids. You may be allowed to take medicines with small sips of water. If you do not follow your health care provider's instructions, your procedure may be delayed or canceled. Medicines Ask your health care provider about: Changing or stopping your regular medicines. This is especially important if you are taking diabetes medicines or blood thinners. Taking medicines such as aspirin and ibuprofen. These medicines can thin your blood. Do not take these medicines unless your health care provider tells you to take them. Taking over-the-counter medicines, vitamins, herbs, and supplements. General instructions If you will be going home right after the procedure, plan to have a responsible adult: Take you home from the hospital or clinic. You will not be allowed to drive. Care for you for the time you are told. Do not use any products that contain nicotine or tobacco for at least 4 weeks before the procedure. These products include cigarettes, chewing tobacco, and vaping devices, such as e-cigarettes. If you need help quitting, ask your health care provider. Ask your health care provider: How your surgery site will be marked. What steps will be taken to help prevent infection. These may include: Removing hair at the surgery site. Washing skin with a germ-killing soap. Taking antibiotic medicine. What happens during the procedure?  An IV will be inserted into one of your veins. You will be given  one or both of the following: A medicine to help you relax (sedative). A medicine to make you fall asleep (general anesthetic). Your surgeon will make several small incisions in your abdomen. The laparoscope will be inserted through one of the small incisions. The camera on the laparoscope will send images to a monitor in the operating room. This lets your surgeon see inside your abdomen. A gas will be pumped into your abdomen. This will expand your abdomen to give the surgeon more room to perform the surgery. Other tools that are needed for the procedure will be inserted through the other incisions. The gallbladder will be removed through one of the incisions. Your common bile duct may be examined. If stones are found in the common bile duct, they may be removed. After your gallbladder has been removed, the incisions will be closed with stitches (sutures), staples, or skin glue. Your incisions will be covered with a bandage (dressing). The procedure may vary among health care providers and hospitals. What happens after the procedure? Your blood pressure, heart rate, breathing rate, and blood oxygen level will be monitored until you leave the hospital or clinic. You will be given medicines as needed to control your pain. You may have a  drain placed in the incision. The drain will be removed a day or two after the procedure. Summary Minimally invasive cholecystectomy, also called laparoscopic cholecystectomy, is surgery to remove the gallbladder using small incisions. Tell your health care provider about all the medical conditions you have and all the medicines you are taking for those conditions. Before the procedure, follow instructions about when to stop eating and drinking and changing or stopping medicines. Plan to have a responsible adult care for you for the time you are told after you leave the hospital or clinic. This information is not intended to replace advice given to you by your  health care provider. Make sure you discuss any questions you have with your health care provider. Document Revised: 10/29/2020 Document Reviewed: 10/29/2020 Elsevier Patient Education  2024 [employer]   General Anesthesia, Adult General anesthesia is the use of medicine to make you fall asleep (unconscious) for a medical procedure. General anesthesia must be used for certain procedures. It is often recommended for surgery or procedures that: Last a long time. Require you to be still or in an unusual position. Are major and can cause blood loss. Affect your breathing. The medicines used for general anesthesia are called general anesthetics. During general anesthesia, these medicines are given along with medicines that: Prevent pain. Control your blood pressure. Relax your muscles. Prevent nausea and vomiting after the procedure. Tell a health care provider about: Any allergies you have. All medicines you are taking, including vitamins, herbs, eye drops, creams, and over-the-counter medicines. Your history of any: Medical conditions you have, including: High blood pressure. Bleeding problems. Diabetes. Heart or lung conditions, such as: Heart failure. Sleep apnea. Asthma. Chronic obstructive pulmonary disease (COPD). Current or recent illnesses, such as: Upper respiratory, chest, or ear infections. Cough or fever. Tobacco or drug use, including marijuana or alcohol use. Depression or anxiety. Surgeries and types of anesthetics you have had. Problems you or family members have had with anesthetic medicines. Whether you are pregnant or may be pregnant. Whether you have any chipped or loose teeth, dentures, caps, bridgework, or issues with your mouth, swallowing, or choking. What are the risks? Your health care provider will talk with you about risks. These may include: Allergic reaction to the medicines. Lung and heart problems. Inhaling food or liquid from the stomach into  the lungs (aspiration). Nerve injury. Injury to the lips, mouth, teeth, or gums. Stroke. Waking up during your procedure and being unable to move. This is rare. These problems are more likely to develop if you are having a major surgery or if you have an advanced or serious medical condition. You can prevent some of these complications by answering all of your health care provider's questions thoroughly and by following all instructions before your procedure. General anesthesia can cause side effects, including: Nausea or vomiting. A sore throat or hoarseness from the breathing tube. Wheezing or coughing. Shaking chills or feeling cold. Body aches. Sleepiness. Confusion, agitation (delirium), or anxiety. What happens before the procedure? When to stop eating and drinking Follow instructions from your health care provider about what you may eat and drink before your procedure. If you do not follow your health care provider's instructions, your procedure may be delayed or canceled. Medicines Ask your health care provider about: Changing or stopping your regular medicines. These include any diabetes medicines or blood thinners you take. Taking medicines such as aspirin and ibuprofen. These medicines can thin your blood. Do not take them unless your health care  provider tells you to. Taking over-the-counter medicines, vitamins, herbs, and supplements. General instructions Do not use any products that contain nicotine or tobacco for at least 4 weeks before the procedure. These products include cigarettes, chewing tobacco, and vaping devices, such as e-cigarettes. If you need help quitting, ask your health care provider. If you brush your teeth on the morning of the procedure, make sure to spit out all of the water and toothpaste. If told by your health care provider, bring your sleep apnea device with you to surgery (if applicable). If you will be going home right after the procedure, plan to  have a responsible adult: Take you home from the hospital or clinic. You will not be allowed to drive. Care for you for the time you are told. What happens during the procedure?  An IV will be inserted into one of your veins. You will be given one or more of the following through a face mask or IV: A sedative. This helps you relax. Anesthesia. This will: Numb certain areas of your body. Make you fall asleep for surgery. After you are unconscious, a breathing tube may be inserted down your throat to help you breathe. This will be removed before you wake up. An anesthesia provider, such as an anesthesiologist, will stay with you throughout your procedure. The anesthesia provider will: Keep you comfortable and safe by continuing to give you medicines and adjusting the amount of medicine that you get. Monitor your blood pressure, heart rate, and oxygen levels to make sure that the anesthetics do not cause any problems. The procedure may vary among health care providers and hospitals. What happens after the procedure? Your blood pressure, temperature, heart rate, breathing rate, and blood oxygen level will be monitored until you leave the hospital or clinic. You will wake up in a recovery area. You may wake up slowly. You may be given medicine to help you with pain, nausea, or any other side effects from the anesthesia. Summary General anesthesia is the use of medicine to make you fall asleep (unconscious) for a medical procedure. Follow your health care provider's instructions about when to stop eating, drinking, or taking certain medicines before your procedure. Plan to have a responsible adult take you home from the hospital or clinic. This information is not intended to replace advice given to you by your health care provider. Make sure you discuss any questions you have with your health care provider. Document Revised: 07/24/2021 Document Reviewed: 07/24/2021 Elsevier Patient Education   2024 [employer]  How to Use Chlorhexidine Before Surgery Chlorhexidine gluconate (CHG) is a germ-killing (antiseptic) solution that is used to clean the skin. It can get rid of the bacteria that normally live on the skin and can keep them away for about 24 hours. To clean your skin with CHG, you may be given: A CHG solution to use in the shower or as part of a sponge bath. A prepackaged cloth that contains CHG. Cleaning your skin with CHG may help lower the risk for infection: While you are staying in the intensive care unit of the hospital. If you have a vascular access, such as a central line, to provide short-term or long-term access to your veins. If you have a catheter to drain urine from your bladder. If you are on a ventilator. A ventilator is a machine that helps you breathe by moving air in and out of your lungs. After surgery. What are the risks? Risks of using CHG include: A skin  reaction. Hearing loss, if CHG gets in your ears and you have a perforated eardrum. Eye injury, if CHG gets in your eyes and is not rinsed out. The CHG product catching fire. Make sure that you avoid smoking and flames after applying CHG to your skin. Do not use CHG: If you have a chlorhexidine allergy or have previously reacted to chlorhexidine. On babies younger than 73 months of age. How to use CHG solution Use CHG only as told by your health care provider, and follow the instructions on the label. Use the full amount of CHG as directed. Usually, this is one bottle. During a shower Follow these steps when using CHG solution during a shower (unless your health care provider gives you different instructions): Start the shower. Use your normal soap and shampoo to wash your face and hair. Turn off the shower or move out of the shower stream. Pour the CHG onto a clean washcloth. Do not use any type of brush or rough-edged sponge. Starting at your neck, lather your body down to your toes. Make sure  you follow these instructions: If you will be having surgery, pay special attention to the part of your body where you will be having surgery. Scrub this area for at least 1 minute. Do not use CHG on your head or face. If the solution gets into your ears or eyes, rinse them well with water. Avoid your genital area. Avoid any areas of skin that have broken skin, cuts, or scrapes. Scrub your back and under your arms. Make sure to wash skin folds. Let the lather sit on your skin for 1-2 minutes or as long as told by your health care provider. Thoroughly rinse your entire body in the shower. Make sure that all body creases and crevices are rinsed well. Dry off with a clean towel. Do not put any substances on your body afterward--such as powder, lotion, or perfume--unless you are told to do so by your health care provider. Only use lotions that are recommended by the manufacturer. Put on clean clothes or pajamas. If it is the night before your surgery, sleep in clean sheets.  During a sponge bath Follow these steps when using CHG solution during a sponge bath (unless your health care provider gives you different instructions): Use your normal soap and shampoo to wash your face and hair. Pour the CHG onto a clean washcloth. Starting at your neck, lather your body down to your toes. Make sure you follow these instructions: If you will be having surgery, pay special attention to the part of your body where you will be having surgery. Scrub this area for at least 1 minute. Do not use CHG on your head or face. If the solution gets into your ears or eyes, rinse them well with water. Avoid your genital area. Avoid any areas of skin that have broken skin, cuts, or scrapes. Scrub your back and under your arms. Make sure to wash skin folds. Let the lather sit on your skin for 1-2 minutes or as long as told by your health care provider. Using a different clean, wet washcloth, thoroughly rinse your entire  body. Make sure that all body creases and crevices are rinsed well. Dry off with a clean towel. Do not put any substances on your body afterward--such as powder, lotion, or perfume--unless you are told to do so by your health care provider. Only use lotions that are recommended by the manufacturer. Put on clean clothes or pajamas. If it  is the night before your surgery, sleep in clean sheets. How to use CHG prepackaged cloths Only use CHG cloths as told by your health care provider, and follow the instructions on the label. Use the CHG cloth on clean, dry skin. Do not use the CHG cloth on your head or face unless your health care provider tells you to. When washing with the CHG cloth: Avoid your genital area. Avoid any areas of skin that have broken skin, cuts, or scrapes. Before surgery Follow these steps when using a CHG cloth to clean before surgery (unless your health care provider gives you different instructions): Using the CHG cloth, vigorously scrub the part of your body where you will be having surgery. Scrub using a back-and-forth motion for 3 minutes. The area on your body should be completely wet with CHG when you are done scrubbing. Do not rinse. Discard the cloth and let the area air-dry. Do not put any substances on the area afterward, such as powder, lotion, or perfume. Put on clean clothes or pajamas. If it is the night before your surgery, sleep in clean sheets.  For general bathing Follow these steps when using CHG cloths for general bathing (unless your health care provider gives you different instructions). Use a separate CHG cloth for each area of your body. Make sure you wash between any folds of skin and between your fingers and toes. Wash your body in the following order, switching to a new cloth after each step: The front of your neck, shoulders, and chest. Both of your arms, under your arms, and your hands. Your stomach and groin area, avoiding the genitals. Your  right leg and foot. Your left leg and foot. The back of your neck, your back, and your buttocks. Do not rinse. Discard the cloth and let the area air-dry. Do not put any substances on your body afterward--such as powder, lotion, or perfume--unless you are told to do so by your health care provider. Only use lotions that are recommended by the manufacturer. Put on clean clothes or pajamas. Contact a health care provider if: Your skin gets irritated after scrubbing. You have questions about using your solution or cloth. You swallow any chlorhexidine. Call your local poison control center ([phone] in the [place] Get help right away if: Your eyes itch badly, or they become very red or swollen. Your skin itches badly and is red or swollen. Your hearing changes. You have trouble seeing. You have swelling or tingling in your mouth or throat. You have trouble breathing. These symptoms may represent a serious problem that is an emergency. Do not wait to see if the symptoms will go away. Get medical help right away. Call your local emergency services (911 in the [place] Do not drive yourself to the hospital. Summary Chlorhexidine gluconate (CHG) is a germ-killing (antiseptic) solution that is used to clean the skin. Cleaning your skin with CHG may help to lower your risk for infection. You may be given CHG to use for bathing. It may be in a bottle or in a prepackaged cloth to use on your skin. Carefully follow your health care provider's instructions and the instructions on the product label. Do not use CHG if you have a chlorhexidine allergy. Contact your health care provider if your skin gets irritated after scrubbing. This information is not intended to replace advice given to you by your health care provider. Make sure you discuss any questions you have with your health care provider. Document Revised: 08/25/2021 Document  Reviewed: 07/08/2020 Elsevier Patient Education  2023 [employer].

## 2023-03-30 ENCOUNTER — Ambulatory Visit: Payer: BC Managed Care – PPO | Admitting: Obstetrics & Gynecology

## 2023-03-30 ENCOUNTER — Encounter (HOSPITAL_COMMUNITY)
Admission: RE | Admit: 2023-03-30 | Discharge: 2023-03-30 | Disposition: A | Discharge status: Home / Self Care | Payer: BC Managed Care – PPO | Source: Ambulatory Visit | Attending: Surgery | Admitting: Surgery

## 2023-03-30 ENCOUNTER — Encounter (HOSPITAL_COMMUNITY): Payer: Self-pay

## 2023-03-30 ENCOUNTER — Other Ambulatory Visit: Payer: BC Managed Care – PPO | Admitting: Radiology

## 2023-03-30 ENCOUNTER — Encounter: Payer: Self-pay | Admitting: Obstetrics & Gynecology

## 2023-03-30 VITALS — BP 135/73 | HR 65 | Ht 60.0 in | Wt 212.0 lb

## 2023-03-30 VITALS — BP 135/73 | HR 65 | Temp 97.5°F | Resp 16 | Ht 60.0 in | Wt 212.0 lb

## 2023-03-30 DIAGNOSIS — N939 Abnormal uterine and vaginal bleeding, unspecified: Secondary | ICD-10-CM

## 2023-03-30 DIAGNOSIS — Z975 Presence of (intrauterine) contraceptive device: Secondary | ICD-10-CM | POA: Diagnosis not present

## 2023-03-30 DIAGNOSIS — D500 Iron deficiency anemia secondary to blood loss (chronic): Secondary | ICD-10-CM | POA: Insufficient documentation

## 2023-03-30 DIAGNOSIS — Z01818 Encounter for other preprocedural examination: Secondary | ICD-10-CM | POA: Diagnosis not present

## 2023-03-30 HISTORY — DX: Nausea with vomiting, unspecified: R11.2

## 2023-03-30 HISTORY — DX: Gastro-esophageal reflux disease without esophagitis: K21.9

## 2023-03-30 HISTORY — DX: Other specified postprocedural states: Z98.890

## 2023-03-30 LAB — CBC WITH DIFFERENTIAL/PLATELET
Abs Immature Granulocytes: 0.01 10*3/uL (ref 0.00–0.07)
Basophils Absolute: 0.1 10*3/uL (ref 0.0–0.1)
Basophils Relative: 1 %
Eosinophils Absolute: 0.2 10*3/uL (ref 0.0–0.5)
Eosinophils Relative: 2 %
HCT: 44.2 % (ref 36.0–46.0)
Hemoglobin: 13.8 g/dL (ref 12.0–15.0)
Immature Granulocytes: 0 %
Lymphocytes Relative: 32 %
Lymphs Abs: 2.5 10*3/uL (ref 0.7–4.0)
MCH: 29.2 pg (ref 26.0–34.0)
MCHC: 31.2 g/dL (ref 30.0–36.0)
MCV: 93.4 fL (ref 80.0–100.0)
Monocytes Absolute: 0.7 10*3/uL (ref 0.1–1.0)
Monocytes Relative: 9 %
Neutro Abs: 4.4 10*3/uL (ref 1.7–7.7)
Neutrophils Relative %: 56 %
Platelets: 332 10*3/uL (ref 150–400)
RBC: 4.73 MIL/uL (ref 3.87–5.11)
RDW: 19.6 % — ABNORMAL HIGH (ref 11.5–15.5)
WBC: 7.9 10*3/uL (ref 4.0–10.5)
nRBC: 0 % (ref 0.0–0.2)

## 2023-03-30 LAB — POCT PREGNANCY, URINE: Preg Test, Ur: NEGATIVE

## 2023-03-30 NOTE — Progress Notes (Signed)
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

## 2023-03-30 NOTE — Progress Notes (Signed)
   GYN VISIT Patient name: Patricia Becker MRN [id number]  Date of birth: [birth year] Chief Complaint:   Follow-up (Ultrasound today)  History of Present Illness:   Patricia Becker is a 47 y.o. [phone] female being seen today for follow up regarding:  AUB: At her last visit, 10/2- Mirena was placed.  In October she had noted menses every 2 weeks lasting up to 14 days with moderate to heavy bleeding and soaking through pads every hour.  Since placement on 10/2-she has had daily bleeding, but she notes the bleeding has decreased in amount.  Currently using one pad daily.  Starting to turn brown and now its getting to be minimal bleeding.  Denies significant pelvic or abdominal pain.  Patient is concerned about her weight.  She has an upcoming cholecystectomy scheduled with Dr. Robyne Peers on 12/4  [place] completed today: IUD in appropriate position.  Normal ovaries bilaterally.  2 small intramural fibroids noted less than 15 mm in size.  Patient's last menstrual period was 02/13/2023 (exact date).    Review of Systems:   Pertinent items are noted in HPI Denies fever/chills, dizziness, headaches, visual disturbances, fatigue, shortness of breath, chest pain, abdominal pain, vomiting. Pertinent History Reviewed:   Past Surgical History:  Procedure Laterality Date   ANKLE SURGERY Right 09/17/2022   CESAREAN SECTION     COLONOSCOPY     2017, normal.    ESOPHAGOGASTRODUODENOSCOPY     KNEE SURGERY  1981   TUBAL LIGATION      Past Medical History:  Diagnosis Date   Anxiety    Anxiety    Depression    Depression    Diverticulosis    DVT (deep venous thrombosis) (HCC) 10/2022   right leg  patient was on xarelto for 3 months   Encounter for screening for HIV 07/08/2017   GERD (gastroesophageal reflux disease)    Hiatal hernia    Iron deficiency anemia    Polypharmacy 07/08/2017   PONV (postoperative nausea and vomiting)    Reviewed problem list, medications and allergies. Physical  Assessment:   Vitals:   03/30/23 1240  BP: 135/73  Pulse: 65  Weight: 212 lb (96.2 kg)  Height: 5' (1.524 m)  Body mass index is 41.4 kg/m.       Physical Examination:   General appearance: alert, well appearing, and in no distress  Psych: mood appropriate, normal affect  Skin: warm & dry   Cardiovascular: normal heart rate noted  Respiratory: normal respiratory effort, no distress  Abdomen: soft, non-tender   Pelvic: examination not indicated  Extremities: no edema   Chaperone: N/A    Assessment & Plan:  1) AUB, IUD in place -Reassured patient that some irregular bleeding with IUD is normal and based on her history it seems to be improving -Reviewed pelvic ultrasound that confirmed IUD in proper location -Plan to continue to monitor symptoms and will follow-up in July for her annual and review her symptoms  Return for July 2025 annual.   Myna Hidalgo, DO Attending Obstetrician & Gynecologist, [facility] [employer], [facility]

## 2023-04-02 ENCOUNTER — Other Ambulatory Visit (HOSPITAL_COMMUNITY): Payer: BC Managed Care – PPO

## 2023-04-11 HISTORY — PX: OTHER SURGICAL HISTORY: SHX169

## 2023-04-12 ENCOUNTER — Inpatient Hospital Stay: Payer: BC Managed Care – PPO | Attending: Hematology

## 2023-04-12 DIAGNOSIS — Z7901 Long term (current) use of anticoagulants: Secondary | ICD-10-CM | POA: Insufficient documentation

## 2023-04-12 DIAGNOSIS — Z8041 Family history of malignant neoplasm of ovary: Secondary | ICD-10-CM | POA: Insufficient documentation

## 2023-04-12 DIAGNOSIS — N920 Excessive and frequent menstruation with regular cycle: Secondary | ICD-10-CM | POA: Insufficient documentation

## 2023-04-12 DIAGNOSIS — D508 Other iron deficiency anemias: Secondary | ICD-10-CM

## 2023-04-12 DIAGNOSIS — D500 Iron deficiency anemia secondary to blood loss (chronic): Secondary | ICD-10-CM | POA: Diagnosis not present

## 2023-04-12 DIAGNOSIS — Z800 Family history of malignant neoplasm of digestive organs: Secondary | ICD-10-CM | POA: Insufficient documentation

## 2023-04-12 DIAGNOSIS — Z86718 Personal history of other venous thrombosis and embolism: Secondary | ICD-10-CM | POA: Insufficient documentation

## 2023-04-12 LAB — CBC
HCT: 43.5 % (ref 36.0–46.0)
Hemoglobin: 14.1 g/dL (ref 12.0–15.0)
MCH: 30.2 pg (ref 26.0–34.0)
MCHC: 32.4 g/dL (ref 30.0–36.0)
MCV: 93.1 fL (ref 80.0–100.0)
Platelets: 220 10*3/uL (ref 150–400)
RBC: 4.67 MIL/uL (ref 3.87–5.11)
RDW: 16 % — ABNORMAL HIGH (ref 11.5–15.5)
WBC: 9.7 10*3/uL (ref 4.0–10.5)
nRBC: 0 % (ref 0.0–0.2)

## 2023-04-12 LAB — IRON AND TIBC
Iron: 106 ug/dL (ref 28–170)
Saturation Ratios: 30 % (ref 10.4–31.8)
TIBC: 354 ug/dL (ref 250–450)
UIBC: 248 ug/dL

## 2023-04-12 LAB — FERRITIN: Ferritin: 30 ng/mL (ref 11–307)

## 2023-04-14 ENCOUNTER — Encounter (HOSPITAL_COMMUNITY): Payer: Self-pay | Admitting: Surgery

## 2023-04-14 ENCOUNTER — Ambulatory Visit (HOSPITAL_COMMUNITY): Payer: BC Managed Care – PPO | Admitting: Anesthesiology

## 2023-04-14 ENCOUNTER — Ambulatory Visit (HOSPITAL_COMMUNITY)
Admission: RE | Admit: 2023-04-14 | Discharge: 2023-04-14 | Disposition: A | Discharge status: Home / Self Care | Payer: BC Managed Care – PPO | Attending: Surgery | Admitting: Surgery

## 2023-04-14 ENCOUNTER — Other Ambulatory Visit: Payer: Self-pay

## 2023-04-14 ENCOUNTER — Encounter (HOSPITAL_COMMUNITY)
Admission: RE | Disposition: A | Discharge status: Home / Self Care | Payer: Self-pay | Source: Home / Self Care | Attending: Surgery

## 2023-04-14 DIAGNOSIS — Z01818 Encounter for other preprocedural examination: Secondary | ICD-10-CM

## 2023-04-14 DIAGNOSIS — K8020 Calculus of gallbladder without cholecystitis without obstruction: Secondary | ICD-10-CM | POA: Diagnosis not present

## 2023-04-14 DIAGNOSIS — Z7901 Long term (current) use of anticoagulants: Secondary | ICD-10-CM | POA: Diagnosis not present

## 2023-04-14 DIAGNOSIS — F419 Anxiety disorder, unspecified: Secondary | ICD-10-CM | POA: Insufficient documentation

## 2023-04-14 DIAGNOSIS — Z9889 Other specified postprocedural states: Secondary | ICD-10-CM | POA: Insufficient documentation

## 2023-04-14 DIAGNOSIS — D509 Iron deficiency anemia, unspecified: Secondary | ICD-10-CM | POA: Diagnosis not present

## 2023-04-14 DIAGNOSIS — F32A Depression, unspecified: Secondary | ICD-10-CM | POA: Insufficient documentation

## 2023-04-14 DIAGNOSIS — Z9851 Tubal ligation status: Secondary | ICD-10-CM | POA: Insufficient documentation

## 2023-04-14 DIAGNOSIS — E66813 Obesity, class 3: Secondary | ICD-10-CM | POA: Insufficient documentation

## 2023-04-14 DIAGNOSIS — Z6841 Body Mass Index (BMI) 40.0 and over, adult: Secondary | ICD-10-CM | POA: Diagnosis not present

## 2023-04-14 DIAGNOSIS — K8010 Calculus of gallbladder with chronic cholecystitis without obstruction: Secondary | ICD-10-CM | POA: Insufficient documentation

## 2023-04-14 DIAGNOSIS — Z86718 Personal history of other venous thrombosis and embolism: Secondary | ICD-10-CM | POA: Diagnosis not present

## 2023-04-14 LAB — POCT PREGNANCY, URINE: Preg Test, Ur: NEGATIVE

## 2023-04-14 SURGERY — CHOLECYSTECTOMY, ROBOT-ASSISTED, LAPAROSCOPIC
Anesthesia: General | Site: Abdomen

## 2023-04-14 MED ORDER — STERILE WATER FOR IRRIGATION IR SOLN
Status: DC | PRN
  Administered 2023-04-14: 500 mL

## 2023-04-14 MED ORDER — OXYCODONE HCL 5 MG PO TABS
5.0000 mg | ORAL_TABLET | Freq: Four times a day (QID) | ORAL | 0 refills | Status: DC | PRN
Start: 2023-04-14 — End: 2023-05-25

## 2023-04-14 MED ORDER — PROPOFOL 10 MG/ML IV BOLUS
INTRAVENOUS | Status: DC | PRN
  Administered 2023-04-14: 160 mg via INTRAVENOUS

## 2023-04-14 MED ORDER — INDOCYANINE GREEN 25 MG IV SOLR
INTRAVENOUS | Status: AC
  Administered 2023-04-14: 2.5 mg via INTRAVENOUS
  Filled 2023-04-14: qty 10

## 2023-04-14 MED ORDER — HEMOSTATIC AGENTS (NO CHARGE) OPTIME
TOPICAL | Status: DC | PRN
  Administered 2023-04-14: 1 via TOPICAL

## 2023-04-14 MED ORDER — SODIUM CHLORIDE 0.9 % IV SOLN
2.0000 g | INTRAVENOUS | Status: AC
  Administered 2023-04-14: 2 g via INTRAVENOUS
  Filled 2023-04-14: qty 2

## 2023-04-14 MED ORDER — BUPIVACAINE HCL (PF) 0.5 % IJ SOLN
INTRAMUSCULAR | Status: AC
Start: 2023-04-14 — End: ?
  Filled 2023-04-14: qty 30

## 2023-04-14 MED ORDER — SODIUM CHLORIDE 0.9 % IR SOLN
Status: DC | PRN
  Administered 2023-04-14: 3000 mL

## 2023-04-14 MED ORDER — CHLORHEXIDINE GLUCONATE CLOTH 2 % EX PADS: 6.0000 | MEDICATED_PAD | Freq: Once | CUTANEOUS | Status: DC

## 2023-04-14 MED ORDER — FENTANYL CITRATE (PF) 250 MCG/5ML IJ SOLN
INTRAMUSCULAR | Status: AC
  Filled 2023-04-14: qty 5

## 2023-04-14 MED ORDER — MIDAZOLAM HCL 2 MG/2ML IJ SOLN
INTRAMUSCULAR | Status: AC
Start: 2023-04-14 — End: ?
  Filled 2023-04-14: qty 2

## 2023-04-14 MED ORDER — INDOCYANINE GREEN 25 MG IV SOLR: 2.5000 mg | Freq: Once | INTRAVENOUS | Status: AC

## 2023-04-14 MED ORDER — AMISULPRIDE (ANTIEMETIC) 5 MG/2ML IV SOLN: 10.0000 mg | Freq: Once | INTRAVENOUS | Status: DC

## 2023-04-14 MED ORDER — ROCURONIUM BROMIDE 100 MG/10ML IV SOLN
INTRAVENOUS | Status: DC | PRN
  Administered 2023-04-14: 70 mg via INTRAVENOUS

## 2023-04-14 MED ORDER — ONDANSETRON HCL 4 MG/2ML IJ SOLN
4.0000 mg | Freq: Once | INTRAMUSCULAR | Status: AC | PRN
  Administered 2023-04-14: 4 mg via INTRAVENOUS
  Filled 2023-04-14: qty 2

## 2023-04-14 MED ORDER — DEXAMETHASONE SODIUM PHOSPHATE 4 MG/ML IJ SOLN
INTRAMUSCULAR | Status: AC
Start: 2023-04-14 — End: ?
  Filled 2023-04-14: qty 3

## 2023-04-14 MED ORDER — HYDROMORPHONE HCL 1 MG/ML IJ SOLN
0.2500 mg | INTRAMUSCULAR | Status: DC | PRN
  Administered 2023-04-14: 0.5 mg via INTRAVENOUS
  Filled 2023-04-14: qty 0.5

## 2023-04-14 MED ORDER — DEXAMETHASONE SODIUM PHOSPHATE 10 MG/ML IJ SOLN
INTRAMUSCULAR | Status: DC | PRN
  Administered 2023-04-14: 10 mg via INTRAVENOUS

## 2023-04-14 MED ORDER — LACTATED RINGERS IV SOLN: INTRAVENOUS | Status: DC | PRN

## 2023-04-14 MED ORDER — LIDOCAINE HCL (CARDIAC) PF 100 MG/5ML IV SOSY
PREFILLED_SYRINGE | INTRAVENOUS | Status: DC | PRN
  Administered 2023-04-14: 60 mg via INTRAVENOUS

## 2023-04-14 MED ORDER — DEXAMETHASONE SODIUM PHOSPHATE 10 MG/ML IJ SOLN
10.0000 mg | Freq: Once | INTRAMUSCULAR | Status: AC
  Administered 2023-04-14: 10 mg via INTRAVENOUS

## 2023-04-14 MED ORDER — ONDANSETRON HCL 4 MG/2ML IJ SOLN
INTRAMUSCULAR | Status: DC | PRN
  Administered 2023-04-14: 4 mg via INTRAVENOUS

## 2023-04-14 MED ORDER — PROPOFOL 10 MG/ML IV BOLUS
INTRAVENOUS | Status: AC
  Filled 2023-04-14: qty 20

## 2023-04-14 MED ORDER — OXYCODONE HCL 5 MG/5ML PO SOLN: 5.0000 mg | Freq: Once | ORAL | Status: DC | PRN

## 2023-04-14 MED ORDER — CHLORHEXIDINE GLUCONATE 0.12 % MT SOLN
OROMUCOSAL | Status: AC
  Filled 2023-04-14: qty 15

## 2023-04-14 MED ORDER — OXYCODONE HCL 5 MG PO TABS: 5.0000 mg | ORAL_TABLET | Freq: Once | ORAL | Status: DC | PRN

## 2023-04-14 MED ORDER — BUPIVACAINE HCL (PF) 0.5 % IJ SOLN
INTRAMUSCULAR | Status: DC | PRN
  Administered 2023-04-14: 30 mL

## 2023-04-14 MED ORDER — DEXMEDETOMIDINE HCL IN NACL 80 MCG/20ML IV SOLN
INTRAVENOUS | Status: DC | PRN
  Administered 2023-04-14: 20 ug via INTRAVENOUS

## 2023-04-14 MED ORDER — ACETAMINOPHEN 500 MG PO TABS: 1000.0000 mg | ORAL_TABLET | Freq: Four times a day (QID) | ORAL | 0 refills | Status: AC

## 2023-04-14 MED ORDER — MIDAZOLAM HCL 5 MG/5ML IJ SOLN
INTRAMUSCULAR | Status: DC | PRN
  Administered 2023-04-14: 2 mg via INTRAVENOUS

## 2023-04-14 MED ORDER — SUGAMMADEX SODIUM 500 MG/5ML IV SOLN
INTRAVENOUS | Status: DC | PRN
  Administered 2023-04-14: 200 mg via INTRAVENOUS

## 2023-04-14 MED ORDER — FENTANYL CITRATE (PF) 100 MCG/2ML IJ SOLN
INTRAMUSCULAR | Status: DC | PRN
  Administered 2023-04-14 (×2): 100 ug via INTRAVENOUS
  Administered 2023-04-14: 50 ug via INTRAVENOUS

## 2023-04-14 SURGICAL SUPPLY — 43 items
BLADE SURG 15 STRL LF DISP TIS (BLADE) ×1 IMPLANT
CAUTERY HOOK MNPLR 1.6 DVNC XI (INSTRUMENTS) ×1 IMPLANT
CHLORAPREP W/TINT 26 (MISCELLANEOUS) ×1 IMPLANT
CLIP LIGATING HEM O LOK PURPLE (MISCELLANEOUS) ×1 IMPLANT
DEFOGGER SCOPE WARMER CLEARIFY (MISCELLANEOUS) ×1 IMPLANT
DERMABOND ADVANCED .7 DNX12 (GAUZE/BANDAGES/DRESSINGS) ×1 IMPLANT
DRAPE ARM DVNC X/XI (DISPOSABLE) ×4 IMPLANT
DRAPE COLUMN DVNC XI (DISPOSABLE) ×1 IMPLANT
ELECT REM PT RETURN 9FT ADLT (ELECTROSURGICAL) ×1
ELECTRODE REM PT RTRN 9FT ADLT (ELECTROSURGICAL) ×1 IMPLANT
FORCEPS BPLR R/ABLATION 8 DVNC (INSTRUMENTS) ×1 IMPLANT
FORCEPS PROGRASP DVNC XI (FORCEP) ×1 IMPLANT
GLOVE BIOGEL PI IND STRL 6.5 (GLOVE) ×2 IMPLANT
GLOVE BIOGEL PI IND STRL 7.0 (GLOVE) ×3 IMPLANT
GLOVE SURG SS PI 6.5 STRL IVOR (GLOVE) ×2 IMPLANT
GOWN STRL REUS W/TWL LRG LVL3 (GOWN DISPOSABLE) ×3 IMPLANT
GRASPER SUT TROCAR 14GX15 (MISCELLANEOUS) ×1 IMPLANT
HEMOSTAT SNOW SURGICEL 2X4 (HEMOSTASIS) IMPLANT
IRRIGATOR SUCT 8 DISP DVNC XI (IRRIGATION / IRRIGATOR) IMPLANT
IV NS IRRIG 3000ML ARTHROMATIC (IV SOLUTION) IMPLANT
KIT TURNOVER KIT A (KITS) ×1 IMPLANT
MANIFOLD NEPTUNE II (INSTRUMENTS) ×1 IMPLANT
NDL HYPO 21X1.5 SAFETY (NEEDLE) ×1 IMPLANT
NDL INSUFFLATION 14GA 120MM (NEEDLE) ×1 IMPLANT
NEEDLE HYPO 21X1.5 SAFETY (NEEDLE) ×1 IMPLANT
NEEDLE INSUFFLATION 14GA 120MM (NEEDLE) ×1 IMPLANT
OBTURATOR OPTICAL STND 8 DVNC (TROCAR) ×1
OBTURATOR OPTICALSTD 8 DVNC (TROCAR) ×1 IMPLANT
PACK LAP CHOLE LZT030E (CUSTOM PROCEDURE TRAY) ×1 IMPLANT
PAD ARMBOARD 7.5X6 YLW CONV (MISCELLANEOUS) ×1 IMPLANT
PENCIL HANDSWITCHING (ELECTRODE) IMPLANT
POSITIONER HEAD 8X9X4 ADT (SOFTGOODS) ×1 IMPLANT
POWDER SURGICEL 3.0 GRAM (HEMOSTASIS) IMPLANT
SEAL UNIV 5-12 XI (MISCELLANEOUS) ×3 IMPLANT
SET BASIN LINEN APH (SET/KITS/TRAYS/PACK) ×1 IMPLANT
SET TUBE SMOKE EVAC HIGH FLOW (TUBING) ×1 IMPLANT
SUT MNCRL AB 4-0 PS2 18 (SUTURE) ×2 IMPLANT
SUT VICRYL 0 AB UR-6 (SUTURE) IMPLANT
SYR 30ML LL (SYRINGE) ×1 IMPLANT
SYS RETRIEVAL 5MM INZII UNIV (BASKET) ×1
SYSTEM RETRIEVL 5MM INZII UNIV (BASKET) IMPLANT
TIP ENDOSCOPIC SURGICEL (TIP) IMPLANT
WATER STERILE IRR 500ML POUR (IV SOLUTION) ×1 IMPLANT

## 2023-04-14 NOTE — Anesthesia Postprocedure Evaluation (Signed)
Anesthesia Post Note  Patient: Patricia Becker  Procedure(s) Performed: XI ROBOTIC ASSISTED LAPAROSCOPIC CHOLECYSTECTOMY (Abdomen)  Patient location during evaluation: PACU Anesthesia Type: General Level of consciousness: awake and alert Pain management: pain level controlled Vital Signs Assessment: post-procedure vital signs reviewed and stable Respiratory status: spontaneous breathing, nonlabored ventilation, respiratory function stable and patient connected to nasal cannula oxygen Cardiovascular status: blood pressure returned to baseline and stable Postop Assessment: no apparent nausea or vomiting Anesthetic complications: no   There were no known notable events for this encounter.   Last Vitals:  Vitals:   04/14/23 1245 04/14/23 1300  BP: (!) 105/57 (!) 104/59  Pulse: 81 83  Resp: 17 (!) 22  Temp:  36.6 C  SpO2: (!) 89% 90%    Last Pain:  Vitals:   04/14/23 1300  PainSc: 5                  Ludy Messamore L Drucilla Cumber

## 2023-04-14 NOTE — Progress Notes (Signed)
[facility]  Spoke with the patient's husband on the phone.  I explained that she tolerated the procedure without difficulty.  She has dissolvable stitches under the skin with overlying skin glue.  This will flake off in 10 to 14 days.  I discharged her home with a prescription for narcotic pain medication that they should take as needed for pain.  I also want her taking scheduled Tylenol.  If they take the narcotic pain medication, they should take a stool softener as well.  The patient will follow-up with me in 2 weeks for phone follow-up.  All questions were answered to his expressed satisfaction.  Feather Berrie, DO [facility] [address] [place],  [address] [phone] (office)  

## 2023-04-14 NOTE — Transfer of Care (Signed)
Immediate Anesthesia Transfer of Care Note  Patient: Patricia Becker  Procedure(s) Performed: XI ROBOTIC ASSISTED LAPAROSCOPIC CHOLECYSTECTOMY (Abdomen)  Patient Location: PACU  Anesthesia Type:General  Level of Consciousness: awake  Airway & Oxygen Therapy: Patient Spontanous Breathing  Post-op Assessment: Post -op Vital signs reviewed and stable  Post vital signs: stable  Last Vitals:  Vitals Value Taken Time  BP 104/59 04/14/23 1300  Temp 36.6 C 04/14/23 1300  Pulse 90 04/14/23 1313  Resp 27 04/14/23 1313  SpO2 93 % 04/14/23 1313  Vitals shown include unfiled device data.  Last Pain:  Vitals:   04/14/23 1300  PainSc: 5       Patients Stated Pain Goal: 7 (04/14/23 1300)  Complications: There were no known notable events for this encounter.

## 2023-04-14 NOTE — H&P (Signed)
[facility] History and Physical   Reason for Referral: Chronic cholecystitis, biliary dyskinesia Referring Physician: Dr. Tasia Catchings   Chief Complaint   New Patient (Initial Visit)        Patricia Becker is a 47 y.o. female.  HPI: Patient presents for evaluation of cholelithiasis and chronic cholecystitis.  Starting a year ago, she started having small upper abdominal pain and concern for gallbladder attacks.  She underwent a right upper quadrant ultrasound last December and it demonstrated cholelithiasis.  She subsequently underwent a HIDA scan this month, which demonstrated concern for biliary dyskinesia/chronic cholecystitis.  She will still have some intermittent attacks with the last one being a week ago.  She will have nausea with her attacks, and will intermittently vomit and that seems to help her pain.  She denies nausea and vomiting currently.  She is having regular bowel movements.  Her past medical history significant for anxiety, depression, and iron deficiency anemia.  She had a DVT this past spring after having ankle surgery.  She was on Xarelto for 3 months, but stopped taking this in August.  She denies use of blood thinning medications.  Her surgical history is significant for a laparoscopic tubal ligation and C-section.  She denies use of tobacco products and illicit drugs.  She rarely drinks alcohol.       Past Medical History:  Diagnosis Date   Anxiety     Anxiety     Depression     Depression     Diverticulosis     Encounter for screening for HIV 07/08/2017   Hiatal hernia     Iron deficiency anemia     Polypharmacy 07/08/2017               Past Surgical History:  Procedure Laterality Date   ANKLE SURGERY Right 09/17/2022   CESAREAN SECTION       COLONOSCOPY        2017, normal.    ESOPHAGOGASTRODUODENOSCOPY       KNEE SURGERY   1981   TUBAL LIGATION                   Family History  Problem Relation Age of Onset   Colon polyps Mother      Suicidality Father     Hypertension Sister     Colon cancer Maternal Grandmother     Cancer Maternal Grandmother     Migraines Daughter     Drug abuse Other            Social History  Social History         Tobacco Use   Smoking status: Never      Passive exposure: Yes   Smokeless tobacco: Never  Vaping Use   Vaping status: Never Used  Substance Use Topics   Alcohol use: Yes      Alcohol/week: 2.0 standard drinks of alcohol      Types: 2 Glasses of wine per week      Comment: very rarely   Drug use: No        Medications: I have reviewed the patient's current medications. Allergies as of 03/11/2023         Reactions    Demerol [meperidine Hcl] Anaphylaxis    Meperidine Anaphylaxis    Aspirin Other (See Comments)    Upset stomach    Nsaids      Pt reports has had "gi issues" lately and was told not to have steroid or NSAIDS  prescribed at this time.            Medication List           Accurate as of March 11, 2023 11:34 AM. If you have any questions, ask your nurse or doctor.              acetaminophen 500 MG tablet Commonly known as: TYLENOL Take by mouth.    ARIPiprazole 5 MG tablet Commonly known as: ABILIFY TAKE 1 TABLET(5 MG) BY MOUTH DAILY    buPROPion 300 MG 24 hr tablet Commonly known as: WELLBUTRIN XL TAKE 1 TABLET(300 MG) BY MOUTH DAILY    busPIRone 10 MG tablet Commonly known as: BUSPAR TAKE 1 TABLET(10 MG) BY MOUTH TWICE DAILY    COLACE PO Take by mouth.    hydrOXYzine 25 MG capsule Commonly known as: VISTARIL Take 1 capsule (25 mg total) by mouth every 8 (eight) hours as needed for anxiety.    IRON PO Take by mouth daily.    multivitamin tablet Take 1 tablet by mouth daily.    omeprazole 40 MG capsule Commonly known as: PRILOSEC Take 1 capsule (40 mg total) by mouth daily.    ondansetron 4 MG tablet Commonly known as: ZOFRAN Take 1 tablet (4 mg total) by mouth every 6 (six) hours as needed for nausea or  vomiting.    VITAMIN D PO Take by mouth.               ROS:  Constitutional: negative for chills, fatigue, and fevers Eyes: negative for visual disturbance and pain Ears, nose, mouth, throat, and face: negative for ear drainage, sore throat, and sinus problems Respiratory: negative for cough, wheezing, and shortness of breath Cardiovascular: negative for chest pain and palpitations Gastrointestinal: positive for abdominal pain, nausea, and reflux symptoms, negative for vomiting Genitourinary:negative for dysuria and frequency Integument/breast: negative for dryness and rash Hematologic/lymphatic: negative for bleeding and lymphadenopathy Musculoskeletal:negative for back pain and neck pain Neurological: negative for dizziness and tremors Endocrine: negative for temperature intolerance   Blood pressure (!) 143/85, pulse 62, temperature (!) 97.5 F (36.4 C), temperature source Oral, resp. rate 16, height 5' (1.524 m), weight 214 lb (97.1 kg), SpO2 98%. Physical Exam Vitals reviewed.  Constitutional:      Appearance: Normal appearance.  HENT:     Head: Normocephalic and atraumatic.  Eyes:     Extraocular Movements: Extraocular movements intact.     Pupils: Pupils are equal, round, and reactive to light.  Cardiovascular:     Rate and Rhythm: Normal rate and regular rhythm.  Pulmonary:     Effort: Pulmonary effort is normal.     Breath sounds: Normal breath sounds.  Abdominal:     Comments: Abdomen soft, nondistended, no percussion tenderness, mild right upper quadrant tenderness to palpation; no rigidity, guarding, rebound tenderness; negative Murphy sign  Musculoskeletal:        General: Normal range of motion.     Cervical back: Normal range of motion.  Skin:    General: Skin is warm and dry.  Neurological:     General: No focal deficit present.     Mental Status: She is alert and oriented to person, place, and time.  Psychiatric:        Mood and Affect: Mood normal.         Behavior: Behavior normal.        Results: Right upper quadrant ultrasound (04/24/2022): IMPRESSION:   Cholelithiasis.   No gallbladder wall thickening  or pericholecystic fluid.   Common bile duct measuring 0.8 cm.   The technologist was unable to properly evaluate for sonographic Murphy sign.    HIDA (03/02/2023): IMPRESSION: Scintigraphic findings compatible with chronic cholecystitis/biliary dyskinesia.   Assessment & Plan:  Patricia Becker is a 47 y.o. female who presents for evaluation of cholelithiasis, chronic cholecystitis/biliary dyskinesia.   -We discussed the pathophysiology of gallbladder disease, and why we recommend surgical excision. -I counseled the patient about the indication, risks and benefits of robotic assisted laparoscopic cholecystectomy.  She understands there is a very small chance for bleeding, infection, injury to normal structures (including common bile duct), conversion to open surgery, persistent symptoms, evolution of postcholecystectomy diarrhea, need for secondary interventions, anesthesia reaction, cardiopulmonary issues and other risks not specifically detailed here. I described the expected recovery, the plan for follow-up and the restrictions during the recovery phase.  All questions were answered. -Patient tentatively scheduled for surgery on 12/4 -Information provided to the patient regarding cholelithiasis, biliary dyskinesia, cholecystitis, cholecystectomy, and low-fat diet -Advised that she needs to present to the emergency department if she begins to have fever, chills, worsening right upper quadrant abdominal pain, nausea, and vomiting.   All questions were answered to the satisfaction of the patient.   Theophilus Kinds, DO [facility] [address] [place], [place] [address] [phone] (office)

## 2023-04-14 NOTE — Anesthesia Preprocedure Evaluation (Addendum)
Anesthesia Evaluation  Patient identified by MRN, date of birth, ID band Patient awake    Reviewed: Allergy & Precautions, H&P , NPO status , Patient's Chart, lab work & pertinent test results, reviewed documented beta blocker date and time   History of Anesthesia Complications (+) PONV and history of anesthetic complications  Airway Mallampati: II  TM Distance: >3 FB Neck ROM: full    Dental no notable dental hx. (+) Dental Advisory Given, Teeth Intact   Pulmonary neg pulmonary ROS   Pulmonary exam normal breath sounds clear to auscultation       Cardiovascular Exercise Tolerance: Good negative cardio ROS Normal cardiovascular exam Rhythm:regular Rate:Normal     Neuro/Psych  PSYCHIATRIC DISORDERS Anxiety Depression    negative neurological ROS     GI/Hepatic Neg liver ROS, hiatal hernia,GERD  ,,  Endo/Other    Class 3 obesity  Renal/GU negative Renal ROS  negative genitourinary   Musculoskeletal   Abdominal   Peds  Hematology negative hematology ROS (+)   Anesthesia Other Findings DVT history  Reproductive/Obstetrics negative OB ROS                             Anesthesia Physical Anesthesia Plan  ASA: 3  Anesthesia Plan: General   Post-op Pain Management: Dilaudid IV   Induction:   PONV Risk Score and Plan: 3 and Ondansetron, Dexamethasone, Midazolam and Scopolamine patch - Pre-op  Airway Management Planned: Oral ETT  Additional Equipment: None  Intra-op Plan:   Post-operative Plan:   Informed Consent: I have reviewed the patients History and Physical, chart, labs and discussed the procedure including the risks, benefits and alternatives for the proposed anesthesia with the patient or authorized representative who has indicated his/her understanding and acceptance.     Dental Advisory Given  Plan Discussed with: CRNA  Anesthesia Plan Comments:          Anesthesia Quick Evaluation

## 2023-04-14 NOTE — Discharge Instructions (Signed)
Ambulatory Surgery Discharge Instructions  General Anesthesia or Sedation Do not drive or operate heavy machinery for 24 hours.  Do not consume alcohol, tranquilizers, sleeping medications, or any non-prescribed medications for 24 hours. Do not make important decisions or sign any important papers in the next 24 hours. You should have someone with you tonight at home.  Activity  You are advised to go directly home from the hospital.  Restrict your activities and rest for a day.  Resume light activity tomorrow. No heavy lifting over 10 lbs or strenuous exercise.  Fluids and Diet Begin with clear liquids, bouillon, dry toast, soda crackers.  If not nauseated, you may go to a regular diet when you desire.  Greasy and spicy foods are not advised.  Medications  If you have not had a bowel movement in 24 hours, take 2 tablespoons over the counter Milk of mag.             You May resume your blood thinners tomorrow (Aspirin, coumadin, or other).  You are being discharged with prescriptions for Opioid/Narcotic Medications: There are some specific considerations for these medications that you should know. Opioid Meds have risks & benefits. Addiction to these meds is always a concern with prolonged use Take medication only as directed Do not drive while taking narcotic pain medication Do not crush tablets or capsules Do not use a different container than medication was dispensed in Lock the container of medication in a cool, dry place out of reach of children and pets. Opioid medication can cause addiction Do not share with anyone else (this is a felony) Do not store medications for future use. Dispose of them properly.     Disposal:  Find a [facility] household drug take back site near you.  If you can't get to a drug take back site, use the recipe below as a last resort to dispose of expired, unused or unwanted drugs. Disposal  (Do not dispose chemotherapy drugs this way, talk to your  prescribing doctor instead.) Step 1: Mix drugs (do not crush) with dirt, kitty litter, or used coffee grounds and add a small amount of water to dissolve any solid medications. Step 2: Seal drugs in plastic bag. Step 3: Place plastic bag in trash. Step 4: Take prescription container and scratch out personal information, then recycle or throw away.  Operative Site  You have a liquid bandage over your incisions, this will begin to flake off in about a week. Ok to shower tomorrow. Keep wound clean and dry. No baths or swimming. No lifting more than 10 pounds.  Contact Information: If you have questions or concerns, please call our office, [phone], Monday- Thursday 8AM-5PM and Friday 8AM-12Noon.  If it is after hours or on the weekend, please call [facility] Main Number, [phone], and ask to speak to the surgeon on call for Dr. Bahar Shelden at [facility].   SPECIFIC COMPLICATIONS TO WATCH FOR: Inability to urinate Fever over 101? F by mouth Nausea and vomiting lasting longer than 24 hours. Pain not relieved by medication ordered Swelling around the operative site Increased redness, warmth, hardness, around operative area Numbness, tingling, or cold fingers or toes Blood -soaked dressing, (small amounts of oozing may be normal) Increasing and progressive drainage from surgical area or exam site  

## 2023-04-14 NOTE — Op Note (Signed)
[facility] Operative Note  04/14/23  Preoperative Diagnosis: Symptomatic Cholelithiasis   Postoperative Diagnosis: Same   Procedure(s) Performed: Robotic Assisted Laparoscopic Cholecystectomy   Surgeon: Theophilus Kinds, DO   Assistants: No qualified resident was available    Anesthesia: General endotracheal   Anesthesiologist: Ronelle Nigh, MD    Specimens: Gallbladder   Estimated Blood Loss: 20 cc   Blood Replacement: None    Complications: None   Wound Class: Clean contaminated   Operative Indications: The patient was found to have cholelithiasis on imaging and was symptomatic.  We discussed the risk of the procedure including but not limited to bleeding, infection, injury to the common bile duct, bile leak, need for further procedures, chance of subtotal cholecystectomy.   Findings:  Non-inflamed gallbladder Critical view of safety noted All clips intact at the end of the case Adequate hemostasis   Procedure: Firefly was given in the preoperative area. The patient was taken to the operating room and placed supine. General endotracheal anesthesia was induced. Intravenous antibiotics were administered per protocol.  An orogastric tube positioned to decompress the stomach. The abdomen was prepared and draped in the usual sterile fashion. A time-out was completed verifying correct patient, procedure, site, positioning, and implant(s) and/or special equipment prior to beginning this procedure.  Veress needle was placed at the infraumbilical area and insufflation was started after confirming a positive saline drop test and no immediate increase in abdominal pressure.  After reaching 15 mm, the Veress needle was removed and a 8 mm port was placed via optiview technique infraumbilical, measuring 20 mm away from the suspected position of the gallbladder.  The abdomen was inspected and no abnormalities or injuries were found.  Under direct vision, ports were  placed in the following locations in a semi curvilinear position around the target of the gallbladder: Two 8 mm ports on the patient's right each having 8cm clearance to the adjacent ports and one 8 mm port placed on the patient's left 8 cm from the umbilical port. Once ports were placed, the table was placed in the reverse Trendelenburg position with the right side up. The Xi platform was brought into the operative field and docked to the ports successfully.  An endoscope was placed through the umbilical port, prograsp through the most lateral right port, fenestrated bipolar to the port just right of the umbilicus, and then a hook cautery in the left port.   The dome of the gallbladder was grasped with prograsp and retracted over the dome of the liver. Adhesions between the gallbladder and omentum, duodenum and transverse colon were lysed via hook cautery. The infundibulum was grasped with the fenestrated grasper and retracted toward the right lower quadrant. This maneuver exposed Calot's triangle. Firefly was used throughout the dissection to ensure safe visualization of the cystic duct.  The peritoneum overlying the gallbladder infundibulum was then dissected and the cystic duct and cystic artery identified.  Critical view of safety with the liver bed clearly visible behind the duct and artery with no additional structures noted.  The cystic duct and cystic artery were doubly clipped and divided close to the gallbladder.    The gallbladder was then dissected from its peritoneal and liver bed attachments by electrocautery. Hemostasis was checked prior to removing the hook cautery.  Surgicel powder and surgical SNOW were placed in the gallbladder fossa.  The [employer] was undocked and moved out of the field.  A 5mm Endo Catch bag was then placed through the umbilical port and  the gallbladder was removed.  The gallbladder was passed off the table as a specimen. There was no evidence of bleeding from the gallbladder  fossa or cystic artery or leakage of the bile from the cystic duct stump. The umbilical port site closed with a 0 vicryl with a PMI needle.  The abdomen was desufflated and secondary trocars were removed under direct vision. No bleeding was noted. Incisions were localized with marcaine.  All skin incisions were closed with subcuticular sutures of 4-0 monocryl and dermabond.   Final inspection revealed acceptable hemostasis. All counts were correct at the end of the case. The patient was awakened from anesthesia and extubated without complication. The OG tube was removed.  The patient went to the PACU in stable condition.   Theophilus Kinds, DO [facility] [address] [place], [place] [address] [phone] (office)

## 2023-04-14 NOTE — Anesthesia Procedure Notes (Signed)
Procedure Name: Intubation Date/Time: 04/14/2023 10:15 AM  Performed by: Shanon Payor, CRNAPre-anesthesia Checklist: Patient identified, Emergency Drugs available, Suction available, Patient being monitored and Timeout performed Patient Re-evaluated:Patient Re-evaluated prior to induction Oxygen Delivery Method: Circle system utilized Preoxygenation: Pre-oxygenation with 100% oxygen Induction Type: IV induction Ventilation: Mask ventilation without difficulty Laryngoscope Size: Mac and 3 Grade View: Grade I Tube type: Oral Tube size: 7.0 mm Number of attempts: 1 Airway Equipment and Method: Stylet Placement Confirmation: ETT inserted through vocal cords under direct vision, positive ETCO2, CO2 detector and breath sounds checked- equal and bilateral Secured at: 23 cm Tube secured with: Tape Dental Injury: Teeth and Oropharynx as per pre-operative assessment

## 2023-04-15 LAB — SURGICAL PATHOLOGY

## 2023-04-16 ENCOUNTER — Other Ambulatory Visit: Payer: BC Managed Care – PPO

## 2023-04-18 ENCOUNTER — Encounter: Payer: Self-pay | Admitting: Physician Assistant

## 2023-04-18 NOTE — Progress Notes (Unsigned)
[facility] [address], [place] [address]   CLINIC:  Medical Oncology/Hematology  PCP:  Tommie Sams, DO [address] [place] [address] [phone]   REASON FOR VISIT:  Follow-up for iron deficiency anemia  CURRENT THERAPY: Intermittent IV iron  INTERVAL HISTORY:   Ms. Klaphake 47 y.o. female returns for routine follow-up of iron deficiency anemia.  She was seen by Dr. Ellin Saba on 01/14/2023 and received Feraheme x 2 in September 2024.  ***  *** Since her last visit, she underwent laparoscopic cholecystectomy on 04/14/2023.  *** ***At today's visit, she reports feeling ***. *** She felt *** after IV iron. *** Fatigue, pica, HA, CP, DOE, LH, restless legs and numbness in hands *** She continues to report menorrhagia with 14 days of bleeding every 28 days. *** She denies any rectal bleeding or melena.  *** Xarelto for DVT??  ***  She has ***% energy and ***% appetite. She endorses that she is maintaining a stable weight.   ASSESSMENT & PLAN:  1.  Severe iron deficiency anemia: - Iron deficiency state caused by excessive menstrual bleeding, complicated by Xarelto (June-October 2024 for treatment of distal DVT *** - LMP 12/14/2022, 14 days of bleeding/28 days.*** - No BRBPR/melena.*** - Started taking iron tablet daily in September 2024 *** - Received Feraheme x 2 in September 2024 ***.  Tolerated infusions well without side effects.  *** - *** Symptoms *** - Most recent labs (04/12/2023): Hgb 14.1/MCV 93.1, ferritin 30, iron saturation 30% - PLAN: *** IV iron if symptomatic ***, otherwise continue daily iron supplement and recheck in 3 months ***  2.  Provoked right posterior tibial/peroneal vein DVT: - Patient reported right calf pain s/p right Achilles tendon surgery on 09/17/2022 - Vascular US right lower extremity (11/02/2022) was consistent with acute DVT involving right posterior tibial veins and right peroneal veins. - Treated with  Xarelto x 3 months *** - PLAN: ***  3.  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  - Mother and sister have anemia. Maternal grandmother had ovarian cancer.   PLAN SUMMARY: >> *** >> *** >> ***   [facility] at [facility] **VISIT SUMMARY & IMPORTANT INSTRUCTIONS **   You were seen today by Rojelio Brenner PA-C for your ***.    *** ***  *** ***  LABS: Return in ***   OTHER TESTS: ***  MEDICATIONS: ***  FOLLOW-UP APPOINTMENT: ***     REVIEW OF SYSTEMS: ***  Review of Systems - Oncology   PHYSICAL EXAM:  ECOG PERFORMANCE STATUS: {CHL ONC ECOG GN:[id number]} *** There were no vitals filed for this visit. There were no vitals filed for this visit. Physical Exam  PAST MEDICAL/SURGICAL HISTORY:  Past Medical History:  Diagnosis Date  . Anxiety   . Anxiety   . Depression   . Depression   . Diverticulosis   . DVT (deep venous thrombosis) (HCC) 10/2022   right leg  patient was on xarelto for 3 months  . Encounter for screening for HIV 07/08/2017  . GERD (gastroesophageal reflux disease)   . Hiatal hernia   . Iron deficiency anemia   . Polypharmacy 07/08/2017  . PONV (postoperative nausea and vomiting)    Past Surgical History:  Procedure Laterality Date  . ANKLE SURGERY Right 09/17/2022  . CESAREAN SECTION    . COLONOSCOPY     2017, normal.   . ESOPHAGOGASTRODUODENOSCOPY    . KNEE SURGERY  1981  . TUBAL LIGATION      SOCIAL HISTORY:  Social History   Socioeconomic History  . Marital status: Married    Spouse name: Dwayne  . Number of children: 3  . Years of education: Not on file  . Highest education level: Associate degree: academic program  Occupational History  . Not on file  Tobacco Use  . Smoking status: Never    Passive exposure: Yes  . Smokeless tobacco: Never  Vaping Use  . Vaping status: Never Used  Substance and Sexual Activity  . Alcohol use: Yes    Alcohol/week: 2.0 standard drinks of  alcohol    Types: 2 Glasses of wine per week    Comment: very rarely  . Drug use: No  . Sexual activity: Yes    Birth control/protection: Surgical    Comment: tubal  Other Topics Concern  . Not on file  Social History Narrative   Moved from [place], [place] in 01/2017. Had lived there for over 20 years. Moved with husband, son, and daughter.       Daughter is 6 y/o-married, expecting and lives in [place]   Daughter is 6 y/o married, has 2 children: 3 and 1 y/o living with her right now   Son is 29 y/o.      Daugther is 68 y/o- husband daughter-step daughter. Lives with her mother now.      Enjoys walking trials, and working out when she could, netflix and chill, and family      Diet: reports she use to meal prep, but has stopped, is supplementing with green powders, meats, fruits   Caffeine: 20 oz 3-4 time a week, no soda, tea- at times mixed sweet and unsweet   Water: 5-8 cups daily      Wears seatbelt.    Wears sunscreen.   Smoke detectors at home.   Does not use phone while driving.   Social Determinants of Health   Financial Resource Strain: Low Risk  (02/18/2023)   Overall Financial Resource Strain (CARDIA)   . Difficulty of Paying Living Expenses: Not very hard  Food Insecurity: No Food Insecurity (02/18/2023)   Hunger Vital Sign   . Worried About Programme researcher, broadcasting/film/video in the Last Year: Never true   . Ran Out of Food in the Last Year: Never true  Transportation Needs: No Transportation Needs (02/18/2023)   PRAPARE - Transportation   . Lack of Transportation (Medical): No   . Lack of Transportation (Non-Medical): No  Physical Activity: Insufficiently Active (02/18/2023)   Exercise Vital Sign   . Days of Exercise per Week: 3 days   . Minutes of Exercise per Session: 30 min  Stress: Stress Concern Present (02/18/2023)   [employer] of Occupational Health - Occupational Stress Questionnaire   . Feeling of Stress : To some extent  Social Connections: Moderately  Integrated (02/18/2023)   Social Connection and Isolation Panel [NHANES]   . Frequency of Communication with Friends and Family: More than three times a week   . Frequency of Social Gatherings with Friends and Family: Once a week   . Attends Religious Services: 1 to 4 times per year   . Active Member of Clubs or Organizations: No   . Attends Banker Meetings: Never   . Marital Status: Married  Catering manager Violence: Not At Risk (01/14/2023)   Humiliation, Afraid, Rape, and Kick questionnaire   . Fear of Current or Ex-Partner: No   . Emotionally Abused: No   .  Physically Abused: No   . Sexually Abused: No    FAMILY HISTORY:  Family History  Problem Relation Age of Onset  . Colon polyps Mother   . Suicidality Father   . Hypertension Sister   . Colon cancer Maternal Grandmother   . Cancer Maternal Grandmother   . Migraines Daughter   . Drug abuse Other     CURRENT MEDICATIONS:  Outpatient Encounter Medications as of 04/19/2023  Medication Sig Note  . acetaminophen (TYLENOL) 500 MG tablet Take 2 tablets (1,000 mg total) by mouth every 6 (six) hours.   . ARIPiprazole (ABILIFY) 5 MG tablet TAKE 1 TABLET(5 MG) BY MOUTH DAILY   . buPROPion (WELLBUTRIN XL) 300 MG 24 hr tablet TAKE 1 TABLET(300 MG) BY MOUTH DAILY   . busPIRone (BUSPAR) 10 MG tablet TAKE 1 TABLET(10 MG) BY MOUTH TWICE DAILY (Patient taking differently: Take 10 mg by mouth daily.)   . docusate sodium (COLACE) 100 MG capsule Take 100 mg by mouth daily.   . Ferrous Sulfate (IRON PO) Take 325 mg by mouth daily.   . hydrOXYzine (VISTARIL) 25 MG capsule Take 1 capsule (25 mg total) by mouth every 8 (eight) hours as needed for anxiety.   . Multiple Vitamin (MULTIVITAMIN) tablet Take 1 tablet by mouth daily. 03/24/2023: On hold per patient until patient purchases more  . omeprazole (PRILOSEC) 40 MG capsule Take 1 capsule (40 mg total) by mouth daily.   . ondansetron (ZOFRAN) 4 MG tablet Take 1 tablet (4 mg total)  by mouth every 6 (six) hours as needed for nausea or vomiting.   Marland Kitchen oxyCODONE (ROXICODONE) 5 MG immediate release tablet Take 1 tablet (5 mg total) by mouth every 6 (six) hours as needed.   . sucralfate (CARAFATE) 1 g tablet Take 1 g by mouth 4 (four) times daily as needed (upset stomach).   Marland Kitchen VITAMIN D PO Take 2,000 Units by mouth daily.    No facility-administered encounter medications on file as of 04/19/2023.    ALLERGIES:  Allergies  Allergen Reactions  . Demerol [Meperidine Hcl] Anaphylaxis  . Aspirin Other (See Comments)    Upset stomach  . Nsaids     Pt reports has had "gi issues" lately and was told not to have steroid or NSAIDS prescribed at this time.    LABORATORY DATA:  I have reviewed the labs as listed.  CBC    Component Value Date/Time   WBC 9.7 04/12/2023 1430   RBC 4.67 04/12/2023 1430   HGB 14.1 04/12/2023 1430   HGB 9.4 (L) 01/08/2023 1557   HCT 43.5 04/12/2023 1430   HCT 30.5 (L) 01/08/2023 1557   PLT 220 04/12/2023 1430   PLT 327 01/08/2023 1557   MCV 93.1 04/12/2023 1430   MCV 79 01/08/2023 1557   MCH 30.2 04/12/2023 1430   MCHC 32.4 04/12/2023 1430   RDW 16.0 (H) 04/12/2023 1430   RDW 13.6 01/08/2023 1557   LYMPHSABS 2.5 03/30/2023 1423   LYMPHSABS 1.9 01/08/2023 1557   MONOABS 0.7 03/30/2023 1423   EOSABS 0.2 03/30/2023 1423   EOSABS 0.1 01/08/2023 1557   BASOSABS 0.1 03/30/2023 1423   BASOSABS 0.1 01/08/2023 1557      Latest Ref Rng & Units 02/23/2023    9:01 AM 01/08/2023    3:57 PM 04/13/2019   11:32 PM  CMP  Glucose 70 - 99 mg/dL  91  86   BUN 6 - 24 mg/dL  9  16   Creatinine 4.09 -  1.00 mg/dL  4.54  0.98   Sodium 119 - 144 mmol/L  139  140   Potassium 3.5 - 5.2 mmol/L  4.3  3.9   Chloride 96 - 106 mmol/L  104  109   CO2 20 - 29 mmol/L  23  23   Calcium 8.7 - 10.2 mg/dL  9.0  8.4   Total Protein 6.0 - 8.5 g/dL 6.1  6.4  6.3   Total Bilirubin 0.0 - 1.2 mg/dL <1.4  0.3  0.4   Alkaline Phos 44 - 121 IU/L 94  85  69   AST 0 - 40 IU/L  12  19  13    ALT 0 - 32 IU/L 23  32  22     DIAGNOSTIC IMAGING:  I have independently reviewed the relevant imaging and discussed with the patient.   WRAP UP:  All questions were answered. The patient knows to call the clinic with any problems, questions or concerns.  Medical decision making: ***  Time spent on visit: I spent *** minutes counseling the patient face to face. The total time spent in the appointment was *** minutes and more than 50% was on counseling.  Carnella Guadalajara, PA-C  ***

## 2023-04-19 ENCOUNTER — Inpatient Hospital Stay: Payer: BC Managed Care – PPO | Admitting: Physician Assistant

## 2023-04-19 ENCOUNTER — Encounter: Payer: Self-pay | Admitting: Family Medicine

## 2023-04-19 VITALS — BP 129/75 | HR 68 | Temp 98.4°F | Resp 17 | Ht 60.0 in | Wt 209.6 lb

## 2023-04-19 DIAGNOSIS — D500 Iron deficiency anemia secondary to blood loss (chronic): Secondary | ICD-10-CM

## 2023-04-19 DIAGNOSIS — Z86718 Personal history of other venous thrombosis and embolism: Secondary | ICD-10-CM | POA: Diagnosis not present

## 2023-04-19 DIAGNOSIS — Z800 Family history of malignant neoplasm of digestive organs: Secondary | ICD-10-CM | POA: Diagnosis not present

## 2023-04-19 DIAGNOSIS — Z8041 Family history of malignant neoplasm of ovary: Secondary | ICD-10-CM | POA: Diagnosis not present

## 2023-04-19 DIAGNOSIS — Z7901 Long term (current) use of anticoagulants: Secondary | ICD-10-CM | POA: Diagnosis not present

## 2023-04-19 DIAGNOSIS — D508 Other iron deficiency anemias: Secondary | ICD-10-CM | POA: Diagnosis not present

## 2023-04-19 DIAGNOSIS — N920 Excessive and frequent menstruation with regular cycle: Secondary | ICD-10-CM | POA: Diagnosis not present

## 2023-04-19 NOTE — Patient Instructions (Signed)
[facility] at [facility] **VISIT SUMMARY & IMPORTANT INSTRUCTIONS **   You were seen today by Rojelio Brenner PA-C for your iron deficiency anemia.   Your blood levels look much better! Your iron levels are still mildly low, but since you are not anemic and are not having any symptoms of iron deficiency, we will hold off on any additional IV iron for the time being and will see how your numbers trend on oral iron supplementation alone.  FOLLOW-UP APPOINTMENT: Same day labs and office visit in 3 months  ** Thank you for trusting me with your healthcare!  I strive to provide all of my patients with quality care at each visit.  If you receive a survey for this visit, I would be so grateful to you for taking the time to provide feedback.  Thank you in advance!  ~ Antionio Negron                   Dr. Doreatha Massed   &   Rojelio Brenner, PA-C   - - - - - - - - - - - - - - - - - -    Thank you for choosing [facility] at [facility] to provide your oncology and hematology care.  To afford each patient quality time with our provider, please arrive at least 15 minutes before your scheduled appointment time.   If you have a lab appointment with the Cancer Center please come in thru the Main Entrance and check in at the main information desk.  You need to re-schedule your appointment should you arrive 10 or more minutes late.  We strive to give you quality time with our providers, and arriving late affects you and other patients whose appointments are after yours.  Also, if you no show three or more times for appointments you may be dismissed from the clinic at the providers discretion.     Again, thank you for choosing [facility].  Our hope is that these requests will decrease the amount of time that you wait before being seen by our physicians.       _____________________________________________________________  Should you have questions  after your visit to [facility], please contact our office at [phone] and follow the prompts.  Our office hours are 8:00 a.m. and 4:30 p.m. Monday - Friday.  Please note that voicemails left after 4:00 p.m. may not be returned until the following business day.  We are closed weekends and major holidays.  You do have access to a nurse 24-7, just call the main number to the clinic [phone] and do not press any options, hold on the line and a nurse will answer the phone.    For prescription refill requests, have your pharmacy contact our office and allow 72 hours.

## 2023-04-23 ENCOUNTER — Ambulatory Visit: Payer: BC Managed Care – PPO | Admitting: Oncology

## 2023-04-25 ENCOUNTER — Encounter: Payer: Self-pay | Admitting: Nurse Practitioner

## 2023-04-28 ENCOUNTER — Ambulatory Visit (INDEPENDENT_AMBULATORY_CARE_PROVIDER_SITE_OTHER): Payer: BC Managed Care – PPO | Admitting: Surgery

## 2023-04-28 DIAGNOSIS — Z09 Encounter for follow-up examination after completed treatment for conditions other than malignant neoplasm: Secondary | ICD-10-CM

## 2023-04-28 NOTE — Progress Notes (Signed)
[facility]  I am calling the patient for post operative evaluation. This is not a billable encounter as it is under the global charges for the surgery.  The patient had a robotic assisted laparoscopic cholecystectomy on 12/4. The patient reports that she is doing well. She is tolerating a diet, having good pain control, and having regular Bms.  The incisions are healing well.  She still has the skin glue in place, but it is starting to flake up.  Advised that she can peel off the glue, and use antibiotic ointment prior to showering to get the remaining glue off. The patient has no concerns.   Pathology: A. GALLBLADDER, CHOLECYSTECTOMY:       Chronic cholecystitis.       Cholelithiasis.   Will see the patient PRN.   Theophilus Kinds, DO [facility] [address] [place], [place] [address] [phone] (office)

## 2023-05-03 ENCOUNTER — Telehealth: Payer: Self-pay | Admitting: *Deleted

## 2023-05-03 NOTE — Telephone Encounter (Signed)
Surgical Date: 04/14/2023 Procedure: XI ROBOTIC ASSISTED LAPAROSCOPIC CHOLECYSTECTOMY  Received VM from patient [phone]~ telephone.  Patient reports that an incision is opening up. Call placed to patient. LMTRC.

## 2023-05-08 ENCOUNTER — Encounter: Payer: Self-pay | Admitting: Obstetrics & Gynecology

## 2023-05-10 DIAGNOSIS — M25571 Pain in right ankle and joints of right foot: Secondary | ICD-10-CM | POA: Diagnosis not present

## 2023-05-10 NOTE — Telephone Encounter (Signed)
Call placed to patient.   Reports that she has small area of opening in the umbilical incision. States that scab was removed and slight opening noted with clear drainage.   States that she has been using ABTx ointment. Advised to stop using ABTx ointment if there is no Sx of infection.   Advised to keep area clean with warm soapy water and dry. Advised if infectious drainage noted, to contact office for further recommendations.

## 2023-05-15 ENCOUNTER — Encounter: Payer: Self-pay | Admitting: Physician Assistant

## 2023-05-17 ENCOUNTER — Ambulatory Visit: Payer: BC Managed Care – PPO | Admitting: Nurse Practitioner

## 2023-05-17 ENCOUNTER — Other Ambulatory Visit: Payer: Self-pay | Admitting: *Deleted

## 2023-05-17 DIAGNOSIS — D500 Iron deficiency anemia secondary to blood loss (chronic): Secondary | ICD-10-CM

## 2023-05-18 ENCOUNTER — Other Ambulatory Visit: Payer: Self-pay | Admitting: Family Medicine

## 2023-05-18 DIAGNOSIS — F419 Anxiety disorder, unspecified: Secondary | ICD-10-CM

## 2023-05-24 ENCOUNTER — Telehealth: Payer: Self-pay | Admitting: Family Medicine

## 2023-05-24 ENCOUNTER — Inpatient Hospital Stay: Payer: BC Managed Care – PPO | Attending: Hematology

## 2023-05-24 DIAGNOSIS — N920 Excessive and frequent menstruation with regular cycle: Secondary | ICD-10-CM | POA: Insufficient documentation

## 2023-05-24 DIAGNOSIS — D500 Iron deficiency anemia secondary to blood loss (chronic): Secondary | ICD-10-CM | POA: Insufficient documentation

## 2023-05-24 DIAGNOSIS — K219 Gastro-esophageal reflux disease without esophagitis: Secondary | ICD-10-CM

## 2023-05-24 LAB — CBC
HCT: 42.8 % (ref 36.0–46.0)
Hemoglobin: 14 g/dL (ref 12.0–15.0)
MCH: 31 pg (ref 26.0–34.0)
MCHC: 32.7 g/dL (ref 30.0–36.0)
MCV: 94.9 fL (ref 80.0–100.0)
Platelets: 256 10*3/uL (ref 150–400)
RBC: 4.51 MIL/uL (ref 3.87–5.11)
RDW: 12.9 % (ref 11.5–15.5)
WBC: 7.7 10*3/uL (ref 4.0–10.5)
nRBC: 0 % (ref 0.0–0.2)

## 2023-05-24 LAB — IRON AND TIBC
Iron: 64 ug/dL (ref 28–170)
Saturation Ratios: 19 % (ref 10.4–31.8)
TIBC: 336 ug/dL (ref 250–450)
UIBC: 272 ug/dL

## 2023-05-24 LAB — FERRITIN: Ferritin: 44 ng/mL (ref 11–307)

## 2023-05-24 MED ORDER — OMEPRAZOLE 40 MG PO CPDR: 40.0000 mg | DELAYED_RELEASE_CAPSULE | Freq: Every day | ORAL | 0 refills | Status: DC

## 2023-05-24 NOTE — Telephone Encounter (Signed)
 Prescription sent electronically to pharmacy.

## 2023-05-24 NOTE — Telephone Encounter (Signed)
 Patient is requesting a prescription for pantoprazole 40 mg send to [facility]

## 2023-05-24 NOTE — Telephone Encounter (Signed)
 Everlene Other G, DO     Please go ahead and send in.

## 2023-05-25 ENCOUNTER — Encounter: Payer: Self-pay | Admitting: Obstetrics & Gynecology

## 2023-05-25 ENCOUNTER — Telehealth: Payer: Self-pay | Admitting: Family Medicine

## 2023-05-25 ENCOUNTER — Other Ambulatory Visit: Payer: Self-pay | Admitting: Nurse Practitioner

## 2023-05-25 ENCOUNTER — Other Ambulatory Visit: Payer: Self-pay | Admitting: Physician Assistant

## 2023-05-25 ENCOUNTER — Ambulatory Visit: Payer: BC Managed Care – PPO | Admitting: Obstetrics & Gynecology

## 2023-05-25 VITALS — BP 122/75 | HR 77 | Ht 60.0 in

## 2023-05-25 DIAGNOSIS — F32A Depression, unspecified: Secondary | ICD-10-CM

## 2023-05-25 DIAGNOSIS — N938 Other specified abnormal uterine and vaginal bleeding: Secondary | ICD-10-CM | POA: Diagnosis not present

## 2023-05-25 DIAGNOSIS — Z30432 Encounter for removal of intrauterine contraceptive device: Secondary | ICD-10-CM | POA: Diagnosis not present

## 2023-05-25 MED ORDER — ARIPIPRAZOLE 5 MG PO TABS: ORAL_TABLET | ORAL | 0 refills | Status: DC

## 2023-05-25 MED ORDER — MEGESTROL ACETATE 40 MG PO TABS: ORAL_TABLET | ORAL | 0 refills | Status: DC

## 2023-05-25 NOTE — Progress Notes (Signed)
 Follow up appointment: Removal IUD  Chief Complaint  Patricia Becker presents with   Procedure    Iud removal    Blood pressure 122/75, pulse 77, height 5' (1.524 m).  Pt had IUD placed for bleeding volume has had continuous spotting Discussed options fo rmanagement but she declines and wants removed but wants megestrol  sync with taper then see how she does thereafter  Mirena  IUD removed without difficulty  MEDS ordered this encounter: Meds ordered this encounter  Medications   megestrol  (MEGACE ) 40 MG tablet    Sig: 3 tablets a day for 5 days, 2 tablets a day for 5 days then 1 tablet daily    Dispense:  45 tablet    Refill:  0    Orders for this encounter: No orders of the defined types were placed in this encounter.   Impression + Management Plan   ICD-10-CM   1. DUB (dysfunctional uterine bleeding)  N93.8    since having IUD placed, desires removal discussed synchronization techniques, high dose progesterone with megestrol  taper, removed + taper    2. Encounter for IUD removal  Z30.432       Follow Up: Return if symptoms worsen or fail to improve.     All questions were answered.  Past Medical History:  Diagnosis Date   Anxiety    Anxiety    Depression    Depression    Diverticulosis    DVT (deep venous thrombosis) (HCC) 10/2022   right leg  Patricia Becker was on xarelto  for 3 months   Encounter for screening for HIV 07/08/2017   GERD (gastroesophageal reflux disease)    Hiatal hernia    Iron  deficiency anemia    Polypharmacy 07/08/2017   PONV (postoperative nausea and vomiting)     Past Surgical History:  Procedure Laterality Date   ANKLE SURGERY Right 09/17/2022   CESAREAN SECTION     COLONOSCOPY     2017, normal.    ESOPHAGOGASTRODUODENOSCOPY     KNEE SURGERY  1981   TUBAL LIGATION      OB History     Gravida  3   Para  3   Term  3   Preterm      AB      Living  3      SAB      IAB      Ectopic      Multiple      Live Births  3            Allergies  Allergen Reactions   Demerol [Meperidine Hcl] Anaphylaxis   Aspirin Other (See Comments)    Upset stomach   Nsaids     Pt reports has had gi issues lately and was told not to have steroid or NSAIDS prescribed at this time.    Social History   Socioeconomic History   Marital status: Married    Spouse name: Dwayne   Number of children: 3   Years of education: Not on file   Highest education level: Professional school degree (e.g., MD, DDS, DVM, JD)  Occupational History   Not on file  Tobacco Use   Smoking status: Never    Passive exposure: Yes   Smokeless tobacco: Never  Vaping Use   Vaping status: Never Used  Substance and Sexual Activity   Alcohol use: Yes    Alcohol/week: 2.0 standard drinks of alcohol    Types: 2 Glasses of wine per week    Comment: very rarely  Drug use: No   Sexual activity: Yes    Birth control/protection: Surgical    Comment: tubal  Other Topics Concern   Not on file  Social History Narrative   Moved from [place], [place] in 01/2017. Had lived there for over 20 years. Moved with husband, son, and daughter.       Daughter is 31 y/o-married, expecting and lives in [place]   Daughter is 47 y/o married, has 2 children: 3 and 1 y/o living with her right now   Son is 21 y/o.      Daugther is 58 y/o- husband daughter-step daughter. Lives with her mother now.      Enjoys walking trials, and working out when she could, netflix and chill, and family      Diet: reports she use to meal prep, but has stopped, is supplementing with green powders, meats, fruits   Caffeine: 20 oz 3-4 time a week, no soda, tea- at times mixed sweet and unsweet   Water : 5-8 cups daily      Wears seatbelt.    Wears sunscreen.   Smoke detectors at home.   Does not use phone while driving.   Social Drivers of Corporate Investment Banker Strain: Low Risk  (05/15/2023)   Overall Financial Resource Strain (CARDIA)    Difficulty of Paying Living  Expenses: Not very hard  Food Insecurity: No Food Insecurity (05/15/2023)   Hunger Vital Sign    Worried About Running Out of Food in the Last Year: Never true    Ran Out of Food in the Last Year: Never true  Transportation Needs: No Transportation Needs (05/15/2023)   PRAPARE - Administrator, Civil Service (Medical): No    Lack of Transportation (Non-Medical): No  Physical Activity: Sufficiently Active (05/15/2023)   Exercise Vital Sign    Days of Exercise per Week: 3 days    Minutes of Exercise per Session: 60 min  Recent Concern: Physical Activity - Insufficiently Active (02/18/2023)   Exercise Vital Sign    Days of Exercise per Week: 3 days    Minutes of Exercise per Session: 30 min  Stress: Stress Concern Present (05/15/2023)   [employer] of Occupational Health - Occupational Stress Questionnaire    Feeling of Stress : To some extent  Social Connections: Moderately Integrated (05/15/2023)   Social Connection and Isolation Panel [NHANES]    Frequency of Communication with Friends and Family: Three times a week    Frequency of Social Gatherings with Friends and Family: Once a week    Attends Religious Services: 1 to 4 times per year    Active Member of [employer] or Organizations: No    Attends Engineer, Structural: Not on file    Marital Status: Married    Family History  Problem Relation Age of Onset   Colon polyps Mother    Suicidality Father    Hypertension Sister    Colon cancer Maternal Grandmother    Cancer Maternal Grandmother    Migraines Daughter    Drug abuse Other

## 2023-05-25 NOTE — Telephone Encounter (Signed)
 Refill on ARIPiprazole (ABILIFY) 5 MG tablet  send to [facility]

## 2023-05-25 NOTE — Progress Notes (Signed)
 Patient contacted clinic last week to report symptoms of iron  deficiency. Repeat labs on 05/24/2023 showed normal Hgb 14.0, but ferritin 44. Since she has symptomatic iron  deficiency with ferritin <100, we will schedule her for IV Venofer  400 mg x 1 dose.  Pleasant CHRISTELLA Barefoot, PA-C 05/25/23 8:39 AM

## 2023-05-26 ENCOUNTER — Encounter: Payer: Self-pay | Admitting: Obstetrics & Gynecology

## 2023-05-31 ENCOUNTER — Ambulatory Visit: Payer: BC Managed Care – PPO | Admitting: Nurse Practitioner

## 2023-05-31 VITALS — BP 108/71 | HR 71 | Temp 97.2°F | Ht 60.0 in | Wt 213.0 lb

## 2023-05-31 DIAGNOSIS — F419 Anxiety disorder, unspecified: Secondary | ICD-10-CM

## 2023-05-31 DIAGNOSIS — Z6841 Body Mass Index (BMI) 40.0 and over, adult: Secondary | ICD-10-CM

## 2023-05-31 DIAGNOSIS — E669 Obesity, unspecified: Secondary | ICD-10-CM

## 2023-05-31 DIAGNOSIS — F32A Depression, unspecified: Secondary | ICD-10-CM | POA: Diagnosis not present

## 2023-05-31 NOTE — Patient Instructions (Signed)
Wegovy Zepbound [employer] or No[employer]

## 2023-06-01 ENCOUNTER — Encounter: Payer: Self-pay | Admitting: Nurse Practitioner

## 2023-06-01 ENCOUNTER — Encounter (INDEPENDENT_AMBULATORY_CARE_PROVIDER_SITE_OTHER): Payer: Self-pay | Admitting: Gastroenterology

## 2023-06-01 ENCOUNTER — Ambulatory Visit (INDEPENDENT_AMBULATORY_CARE_PROVIDER_SITE_OTHER): Payer: BC Managed Care – PPO | Admitting: Gastroenterology

## 2023-06-01 ENCOUNTER — Inpatient Hospital Stay: Payer: BC Managed Care – PPO

## 2023-06-01 VITALS — BP 122/79 | HR 106 | Temp 97.1°F | Ht 60.0 in | Wt 213.9 lb

## 2023-06-01 VITALS — BP 136/65 | HR 57 | Temp 96.4°F | Resp 18

## 2023-06-01 DIAGNOSIS — Z9049 Acquired absence of other specified parts of digestive tract: Secondary | ICD-10-CM

## 2023-06-01 DIAGNOSIS — N920 Excessive and frequent menstruation with regular cycle: Secondary | ICD-10-CM | POA: Diagnosis not present

## 2023-06-01 DIAGNOSIS — D500 Iron deficiency anemia secondary to blood loss (chronic): Secondary | ICD-10-CM | POA: Diagnosis not present

## 2023-06-01 DIAGNOSIS — K219 Gastro-esophageal reflux disease without esophagitis: Secondary | ICD-10-CM

## 2023-06-01 MED ORDER — SODIUM CHLORIDE 0.9 % IV SOLN
400.0000 mg | Freq: Once | INTRAVENOUS | Status: AC
  Administered 2023-06-01: 400 mg via INTRAVENOUS
  Filled 2023-06-01: qty 400

## 2023-06-01 MED ORDER — CETIRIZINE HCL 10 MG PO TABS
10.0000 mg | ORAL_TABLET | Freq: Once | ORAL | Status: AC
  Administered 2023-06-01: 10 mg via ORAL
  Filled 2023-06-01: qty 1

## 2023-06-01 MED ORDER — METHYLPREDNISOLONE SODIUM SUCC 125 MG IJ SOLR
125.0000 mg | Freq: Once | INTRAMUSCULAR | Status: AC
  Administered 2023-06-01: 125 mg via INTRAVENOUS
  Filled 2023-06-01: qty 2

## 2023-06-01 MED ORDER — FAMOTIDINE IN NACL 20-0.9 MG/50ML-% IV SOLN
20.0000 mg | Freq: Once | INTRAVENOUS | Status: AC
  Administered 2023-06-01: 20 mg via INTRAVENOUS
  Filled 2023-06-01: qty 50

## 2023-06-01 MED ORDER — SODIUM CHLORIDE 0.9 % IV SOLN: INTRAVENOUS | Status: DC

## 2023-06-01 NOTE — Progress Notes (Signed)
Patient presents today for Venofer, patient reports taking Tylenol this morning. Patient tolerated iron infusion with no complaints voiced. Peripheral IV site clean and dry with good blood return noted before and after infusion. Band aid applied. VSS with discharge and left in satisfactory condition with no s/s of distress noted.

## 2023-06-01 NOTE — Patient Instructions (Addendum)
Please continue with omeprazole 40mg  daily, as discussed if your PCP is okay to continue filling this, we can see you on an as needed basis, unless you have any new or worrisome GI issues  We will plan for repeat colonoscopy in February of 2029 (5 years from your last) given history of polyps  It was a pleasure to see you today. I want to create trusting relationships with patients and provide genuine, compassionate, and quality care. I truly value your feedback! please be on the lookout for a survey regarding your visit with me today. I appreciate your input about our visit and your time in completing this!    Stefana Lodico L. Jeanmarie Hubert, MSN, APRN, AGNP-C Adult-Gerontology Nurse Practitioner [facility]

## 2023-06-01 NOTE — Progress Notes (Signed)
   Subjective:    Patient ID: Patricia Becker, female    DOB: 14-Jul-1975, 48 y.o.   MRN: [id number]  HPI Presents to discuss weight loss.  Patient is currently on weight watchers, this worked well at the beginning but now is struggling to lose weight.  Is interested in trying a GLP-1 injection.  Denies any personal history of pancreatitis.  Has had a cholecystectomy.  No family history of pancreatic or thyroid cancer.   Review of Systems  Constitutional:  Positive for fatigue.  Respiratory:  Negative for cough, chest tightness and shortness of breath.   Cardiovascular:  Negative for chest pain.      02/19/2023    3:06 PM  Depression screen PHQ 2/9  Decreased Interest 1  Down, Depressed, Hopeless 1  PHQ - 2 Score 2  Altered sleeping 2  Tired, decreased energy 3  Change in appetite 1  Feeling bad or failure about yourself  0  Trouble concentrating 0  Moving slowly or fidgety/restless 0  Suicidal thoughts 0  PHQ-9 Score 8  Difficult doing work/chores Somewhat difficult      02/19/2023    3:06 PM 01/08/2023    3:33 PM 10/08/2022    2:04 PM 07/29/2022    1:40 PM  GAD 7 : Generalized Anxiety Score  Nervous, Anxious, on Edge 1 1 2 1   Control/stop worrying 1 2 2 2   Worry too much - different things 1 2 2 2   Trouble relaxing 0 2 2 2   Restless 0 2 2 2   Easily annoyed or irritable 1 2 2 2   Afraid - awful might happen 1 1 2 2   Total GAD 7 Score 5 12 14 13   Anxiety Difficulty Somewhat difficult Somewhat difficult Somewhat difficult Somewhat difficult         Objective:   Physical Exam NAD.  Alert, oriented.  Mildly fatigued in appearance.  Calm affect.  Making good eye contact.  Speech clear.  Lungs clear.  Heart regular rate rhythm.   Today's Vitals   05/31/23 1421  BP: 108/71  Pulse: 71  Temp: (!) 97.2 F (36.2 C)  SpO2: 98%  Weight: 213 lb (96.6 kg)  Height: 5' (1.524 m)   Body mass index is 41.6 kg/m.  Significant central obesity noted.      Assessment & Plan:    Problem List Items Addressed This Visit       Other   Anxiety and depression - Primary   Obesity (BMI 30-39.9)   Patient defers changes in her mental health medications at this time.  Has been struggling but feels a lot of this is due to fatigue and self-image related to her weight. Patient will look into coverage of Wegovy or Zepbound with her insurance and notify office about coverage and where to send it.  Advised patient that cost can range from $300-$500 a month.  Use caution if she decides to do a compounded form of the medication.  Continue her efforts with weight watchers. Recheck here as needed.

## 2023-06-01 NOTE — Progress Notes (Signed)
Referring Provider: Tommie Sams, DO Primary Care Physician:  Tommie Sams, DO Primary GI Physician: Dr. Tasia Catchings  Chief Complaint  Patient presents with   Follow-up    Patient here today for a follow up visit. She says she had her gallbladder removed April 14, 2023. She says she is doing much better since this has been done. She says she has an occasional bout with reflux,but prilosec seems to be working for her.    HPI:   Patricia Becker is a 48 y.o. female with past medical history of DVT due to ankle surgery was on Xarelto was 3 months , severe IDA due to menorrhagia   Patient presenting today for for follow up of GERD and abdominal pain  Last seen October 2024, at that time having some recurrent midepigastric pain.  Taking laxative and iron pill.  Recommended to obtain esophagram, HIDA scan, stop Protonix and start omeprazole 40 mg daily, reflux precautions  HIDA scan in October with findings compatible for chronic cholecystitis/biliary dyskinesia She underwent laparoscopic cholecystectomy on 04/14/2023  Last hemoglobin 14, iron 64, TIBC 336, saturation 19Patient with history of colon cancer in 2022 status post ilio, ferritin 44 on 05/24/2023.  Present: Feeling much better since GB removal in december. GERD well controlled on prilosec, maybe 2 episodes of reflux since she started this in October though these occurred when she ate something triggering. She does note she had some nausea this morning but also woke up with a headache, recently had IUD removed (due to ongoing bleeding) and thinks her hormones are playing a role. She has had more normal menstrual cycles since IUD removed. She has had some diarrhea if she eats certain foods like greasy foods since GB removal so mostly trying to avoid these things. Denies any rectal bleeding or melena. She is maintained on PO iron. Stools are mostly green in color. She did have recent iron infusion.   Last EGD/ Colonoscopy:06/2022 [facility]   INDICATIONS:  EPIGASTRIC ABDOMINAL PAIN  IMPRESSIONS:  - NORMAL ESOPHAGUS.  - Z-LINE REGULAR, 33 CM FROM THE INCISORS.  - 3 CM HIATAL HERNIA.  - NORMAL STOMACH.  BIOPSIED.   - NORMAL EXAMINED DUODENUM.  INDICATIONS:  COLON CANCER SCREENING IN PATIENT AT INCREASED RISK: FAMILY HISTORY OF  1ST-DEGREE  RELATIVE WITH COLON POLYPS BEFORE AGE 31 YEARS   IMPRESSIONS:  - THREE 2 TO 3 MM POLYPS IN THE SIGMOID COLON, REMOVED WITH A COLD SNARE.  RESECTED AND RETRIEVED.  - DIVERTICULOSIS IN THE SIGMOID COLON AND IN THE TRANSVERSE COLON.  - NON-BLEEDING INTERNAL HEMORRHOIDS.   SOURCE/SPECIMEN  1. STOMACH, RANDOM GASTRIC -BIOPSY  2. COLON, SIGMOID COLON-POLYPECTOMY    1. STOMACH, RANDOM GASTRIC -BIOPSY:  CHRONIC INACTIVE GASTRITIS. NEGATIVE FOR H.PYLORI ON H&E STAIN.   2. COLON, SIGMOID COLON-POLYPECTOMY:  HYPERPLASTIC POLYPS (2).    Past Medical History:  Diagnosis Date   Anxiety    Anxiety    Depression    Depression    Diverticulosis    DVT (deep venous thrombosis) (HCC) 10/2022   right leg  patient was on xarelto for 3 months   Encounter for screening for HIV 07/08/2017   GERD (gastroesophageal reflux disease)    Hiatal hernia    Iron deficiency anemia    Polypharmacy 07/08/2017   PONV (postoperative nausea and vomiting)     Past Surgical History:  Procedure Laterality Date   ANKLE SURGERY Right 09/17/2022   CESAREAN SECTION     COLONOSCOPY  2017, normal.    ESOPHAGOGASTRODUODENOSCOPY     KNEE SURGERY  1981   TUBAL LIGATION      Current Outpatient Medications  Medication Sig Dispense Refill   acetaminophen (TYLENOL) 500 MG tablet Take 2 tablets (1,000 mg total) by mouth every 6 (six) hours. 30 tablet 0   ARIPiprazole (ABILIFY) 5 MG tablet TAKE 1 TABLET(5 MG) BY MOUTH DAILY 90 tablet 0   buPROPion (WELLBUTRIN XL) 300 MG 24 hr tablet TAKE 1 TABLET(300 MG) BY MOUTH DAILY 90 tablet 0   busPIRone (BUSPAR) 10 MG tablet TAKE 1 TABLET(10 MG) BY MOUTH TWICE  DAILY (Patient taking differently: Take 10 mg by mouth daily.) 180 tablet 0   docusate sodium (COLACE) 100 MG capsule Take 100 mg by mouth daily.     Ferrous Sulfate (IRON PO) Take 325 mg by mouth daily.     hydrOXYzine (VISTARIL) 25 MG capsule Take 1 capsule (25 mg total) by mouth every 8 (eight) hours as needed for anxiety. 90 capsule 2   omeprazole (PRILOSEC) 40 MG capsule Take 1 capsule (40 mg total) by mouth daily. 90 capsule 0   ondansetron (ZOFRAN) 4 MG tablet Take 1 tablet (4 mg total) by mouth every 6 (six) hours as needed for nausea or vomiting. 20 tablet 0   sucralfate (CARAFATE) 1 g tablet Take 1 g by mouth 4 (four) times daily as needed (upset stomach).     VITAMIN D PO Take 2,000 Units by mouth daily.     Multiple Vitamin (MULTIVITAMIN) tablet Take 1 tablet by mouth daily. (Patient not taking: Reported on 06/01/2023)     No current facility-administered medications for this visit.    Allergies as of 06/01/2023 - Review Complete 06/01/2023  Allergen Reaction Noted   Demerol [meperidine hcl] Anaphylaxis 07/08/2017   Aspirin Other (See Comments) 07/08/2017   Nsaids  07/24/2022    Family History  Problem Relation Age of Onset   Colon polyps Mother    Suicidality Father    Hypertension Sister    Colon cancer Maternal Grandmother    Cancer Maternal Grandmother    Migraines Daughter    Drug abuse Other     Social History   Socioeconomic History   Marital status: Married    Spouse name: Dwayne   Number of children: 3   Years of education: Not on file   Highest education level: Professional school degree (e.g., MD, DDS, DVM, JD)  Occupational History   Not on file  Tobacco Use   Smoking status: Never    Passive exposure: Yes   Smokeless tobacco: Never  Vaping Use   Vaping status: Never Used  Substance and Sexual Activity   Alcohol use: Yes    Alcohol/week: 2.0 standard drinks of alcohol    Types: 2 Glasses of wine per week    Comment: very rarely   Drug use: No    Sexual activity: Yes    Birth control/protection: Surgical    Comment: tubal  Other Topics Concern   Not on file  Social History Narrative   Moved from [place], [place] in 01/2017. Had lived there for over 20 years. Moved with husband, son, and daughter.       Daughter is 78 y/o-married, expecting and lives in [place]   Daughter is 78 y/o married, has 2 children: 3 and 1 y/o living with her right now   Son is 57 y/o.      Daugther is 50 y/o- husband daughter-step daughter. Lives with her mother  now.      Enjoys walking trials, and working out when she could, netflix and chill, and family      Diet: reports she use to meal prep, but has stopped, is supplementing with green powders, meats, fruits   Caffeine: 20 oz 3-4 time a week, no soda, tea- at times mixed sweet and unsweet   Water: 5-8 cups daily      Wears seatbelt.    Wears sunscreen.   Smoke detectors at home.   Does not use phone while driving.   Social Drivers of Corporate investment banker Strain: Low Risk  (05/28/2023)   Overall Financial Resource Strain (CARDIA)    Difficulty of Paying Living Expenses: Not very hard  Food Insecurity: No Food Insecurity (05/28/2023)   Hunger Vital Sign    Worried About Running Out of Food in the Last Year: Never true    Ran Out of Food in the Last Year: Never true  Transportation Needs: No Transportation Needs (05/28/2023)   PRAPARE - Administrator, Civil Service (Medical): No    Lack of Transportation (Non-Medical): No  Physical Activity: Sufficiently Active (05/28/2023)   Exercise Vital Sign    Days of Exercise per Week: 3 days    Minutes of Exercise per Session: 60 min  Stress: Stress Concern Present (05/28/2023)   [employer] of Occupational Health - Occupational Stress Questionnaire    Feeling of Stress : To some extent  Social Connections: Moderately Integrated (05/28/2023)   Social Connection and Isolation Panel [NHANES]    Frequency of Communication with  Friends and Family: Three times a week    Frequency of Social Gatherings with Friends and Family: Once a week    Attends Religious Services: 1 to 4 times per year    Active Member of [employer] or Organizations: No    Attends Engineer, structural: Not on file    Marital Status: Married    Review of systems General: negative for malaise, night sweats, fever, chills, weight loss Neck: Negative for lumps, goiter, pain and significant neck swelling Resp: Negative for cough, wheezing, dyspnea at rest CV: Negative for chest pain, leg swelling, palpitations, orthopnea GI: denies melena, hematochezia, nausea, vomiting, diarrhea, constipation, dysphagia, odyonophagia, early satiety or unintentional weight loss.  MSK: Negative for joint pain or swelling, back pain, and muscle pain. Psych: Denies depression, anxiety, memory loss, confusion. No homicidal or suicidal ideation.  Heme: Negative for prolonged bleeding, bruising easily, and swollen nodes. Neuro: negative for tremor, gait imbalance, syncope and seizures. The remainder of the review of systems is noncontributory.  Physical Exam: BP 122/79 (BP Location: Left Arm, Patient Position: Sitting, Cuff Size: Large)   Pulse (!) 106   Temp (!) 97.1 F (36.2 C) (Temporal)   Ht 5' (1.524 m)   Wt 213 lb 14.4 oz (97 kg)   BMI 41.77 kg/m  General:   Alert and oriented. No distress noted. Pleasant and cooperative.  Head:  Normocephalic and atraumatic. Eyes:  Conjuctiva clear without scleral icterus. Mouth:  Oral mucosa pink and moist. Good dentition. No lesions. Heart: Normal rate and rhythm, s1 and s2 heart sounds present.  Lungs: Clear lung sounds in all lobes. Respirations equal and unlabored. Abdomen:  +BS, soft, non-tender and non-distended. No rebound or guarding. No HSM or masses noted. Neurologic:  Alert and  oriented x4 Psych:  Alert and cooperative. Normal mood and affect.  Invalid input(s): "6 MONTHS"   ASSESSMENT: Sinclair Compton is a  48 y.o. female presenting today for follow up of abdominal pain and GERD  Resolution of abdominal pain after GB removal in December. GERD well managed on omeprazole 40mg  daily. Having rare breakthrough unless she eats certain foods. Overall she has no GI complaints today. As PCP has filled her omeprazole, we can plan to see patient on as needed basis and will place her on recall list for 5 years. She was encouraged to reach out if she develops any new or worsening GI issues in the meantime.    PLAN:  -continue with omeprazole 40mg  daily (PCP filling this) -good reflux precautions  -place in reminder for repeat colonoscopy in 5 year  All questions were answered, patient verbalized understanding and is in agreement with plan as outlined above.   Follow Up: PRN   Tracy Kinner L. Jeanmarie Hubert, MSN, APRN, AGNP-C Adult-Gerontology Nurse Practitioner [facility]

## 2023-06-01 NOTE — Patient Instructions (Signed)
 [facility] [place] - A DEPT OF [facility]  Discharge Instructions: Thank you for choosing [facility] to provide your oncology and hematology care.  If you have a lab appointment with the Cancer Center - please note that after April 8th, 2024, all labs will be drawn in the cancer center.  You do not have to check in or register with the main entrance as you have in the past but will complete your check-in in the cancer center.  Wear comfortable clothing and clothing appropriate for easy access to any Portacath or PICC line.   We strive to give you quality time with your provider. You may need to reschedule your appointment if you arrive late (15 or more minutes).  Arriving late affects you and other patients whose appointments are after yours.  Also, if you miss three or more appointments without notifying the office, you may be dismissed from the clinic at the provider's discretion.      For prescription refill requests, have your pharmacy contact our office and allow 72 hours for refills to be completed.    Today you received the following Venofer, return as scheduled.   To help prevent nausea and vomiting after your treatment, we encourage you to take your nausea medication as directed.  BELOW ARE SYMPTOMS THAT SHOULD BE REPORTED IMMEDIATELY: *FEVER GREATER THAN 100.4 F (38 C) OR HIGHER *CHILLS OR SWEATING *NAUSEA AND VOMITING THAT IS NOT CONTROLLED WITH YOUR NAUSEA MEDICATION *UNUSUAL SHORTNESS OF BREATH *UNUSUAL BRUISING OR BLEEDING *URINARY PROBLEMS (pain or burning when urinating, or frequent urination) *BOWEL PROBLEMS (unusual diarrhea, constipation, pain near the anus) TENDERNESS IN MOUTH AND THROAT WITH OR WITHOUT PRESENCE OF ULCERS (sore throat, sores in mouth, or a toothache) UNUSUAL RASH, SWELLING OR PAIN  UNUSUAL VAGINAL DISCHARGE OR ITCHING   Items with * indicate a potential emergency and should be followed up as soon as possible or  go to the Emergency Department if any problems should occur.  Please show the CHEMOTHERAPY ALERT CARD or IMMUNOTHERAPY ALERT CARD at check-in to the Emergency Department and triage nurse.  Should you have questions after your visit or need to cancel or reschedule your appointment, please contact [facility] [place] - A DEPT OF [facility] [phone]  and follow the prompts.  Office hours are 8:00 a.m. to 4:30 p.m. Monday - Friday. Please note that voicemails left after 4:00 p.m. may not be returned until the following business day.  We are closed weekends and major holidays. You have access to a nurse at all times for urgent questions. Please call the main number to the clinic [phone] and follow the prompts.  For any non-urgent questions, you may also contact your provider using MyChart. We now offer e-Visits for anyone 1 and older to request care online for non-urgent symptoms. For details visit [email or web address].   Also download the MyChart app! Go to the app store, search "MyChart", open the app, select [place], and log in with your MyChart username and password.

## 2023-06-10 LAB — LAB REPORT - SCANNED
A1c: 5.4
EGFR: 91
TSH: 2.48 (ref 0.41–5.90)

## 2023-06-21 DIAGNOSIS — M25571 Pain in right ankle and joints of right foot: Secondary | ICD-10-CM | POA: Diagnosis not present

## 2023-06-28 ENCOUNTER — Ambulatory Visit: Payer: BC Managed Care – PPO | Admitting: Nurse Practitioner

## 2023-06-28 ENCOUNTER — Encounter: Payer: Self-pay | Admitting: Nurse Practitioner

## 2023-06-28 VITALS — BP 116/80 | HR 73 | Temp 97.2°F | Ht 60.0 in | Wt 207.0 lb

## 2023-06-28 DIAGNOSIS — L602 Onychogryphosis: Secondary | ICD-10-CM | POA: Diagnosis not present

## 2023-06-28 DIAGNOSIS — L089 Local infection of the skin and subcutaneous tissue, unspecified: Secondary | ICD-10-CM

## 2023-06-28 MED ORDER — CEPHALEXIN 500 MG PO CAPS: 500.0000 mg | ORAL_CAPSULE | Freq: Three times a day (TID) | ORAL | 1 refills | Status: DC

## 2023-06-29 DIAGNOSIS — L602 Onychogryphosis: Secondary | ICD-10-CM | POA: Insufficient documentation

## 2023-06-29 DIAGNOSIS — L089 Local infection of the skin and subcutaneous tissue, unspecified: Secondary | ICD-10-CM | POA: Insufficient documentation

## 2023-06-29 NOTE — Progress Notes (Signed)
   Subjective:    Patient ID: Patricia Becker, female    DOB: 07-Jan-1976, 48 y.o.   MRN: [id number]  HPI Presents for complaints of thickened toenails on both great toes has been going on for a long time.  States she has a history of fungal infections.  Also history of ingrown toenails.  Over the past few days has developed some tenderness and swelling at the lateral part of the right great toe with some drainage.  No fever.        Objective:   Physical Exam NAD.  Alert, oriented.  Both great toes have very thickened nails at least 1 cm or more.  Along the lateral edge of the right great toe there is some slight yellowish drainage and a very small amount of blood with mild edema and tenderness.  Mild erythema around the toe.  Does not extend past the toenail. Today's Vitals   06/28/23 1530  BP: 116/80  Pulse: 73  Temp: (!) 97.2 F (36.2 C)  SpO2: 98%  Weight: 207 lb (93.9 kg)  Height: 5' (1.524 m)   Body mass index is 40.43 kg/m.        Assessment & Plan:   Problem List Items Addressed This Visit       Musculoskeletal and Integument   Onychauxis   Relevant Orders   Ambulatory referral to Podiatry     Other   Infection of great toe - Primary   Relevant Medications   cephALEXin (KEFLEX) 500 MG capsule   Other Relevant Orders   Ambulatory referral to Podiatry   Meds ordered this encounter  Medications   cephALEXin (KEFLEX) 500 MG capsule    Sig: Take 1 capsule (500 mg total) by mouth 3 (three) times daily.    Dispense:  21 capsule    Refill:  1    Supervising Provider:   Lilyan Punt A [[id number]]   Start Keflex as directed.  Warm soaks.  May apply some antibacterial ointment to the area.  Warning signs reviewed including worsening redness or spreading up the toe or foot. Referred to podiatry for evaluation. Refill given on Keflex in the meantime in case it is needed.  Call back to the office in the meantime if any problems.

## 2023-07-08 ENCOUNTER — Encounter: Payer: Self-pay | Admitting: Nurse Practitioner

## 2023-07-14 DIAGNOSIS — M25571 Pain in right ankle and joints of right foot: Secondary | ICD-10-CM | POA: Diagnosis not present

## 2023-07-19 DIAGNOSIS — B351 Tinea unguium: Secondary | ICD-10-CM | POA: Diagnosis not present

## 2023-07-19 DIAGNOSIS — M79674 Pain in right toe(s): Secondary | ICD-10-CM | POA: Diagnosis not present

## 2023-07-19 DIAGNOSIS — L03031 Cellulitis of right toe: Secondary | ICD-10-CM | POA: Diagnosis not present

## 2023-07-19 NOTE — Progress Notes (Unsigned)
 [facility] [address] [place], [place] [address]   CLINIC:  Medical Oncology/Hematology  PCP:  Tommie Sams, DO [address] [place] [address] [phone]   REASON FOR VISIT:  Follow-up for iron deficiency anemia  CURRENT THERAPY: Intermittent IV iron  INTERVAL HISTORY:   Patricia Becker 48 y.o. female returns for routine follow-up of iron deficiency anemia.  She was last seen by Rojelio Brenner PA-C on 04/19/2023.  She contacted clinic in January 2025 to report some increased symptoms of iron deficiency, and repeat labs showed ferritin <100, therefore she was scheduled for Venofer 400 mg x 1 (given on 06/01/2023).  Patient reports feeling *** after IV iron. ***She felt improved energy after IV iron, with resolution of restless legs and decreased paresthesias in hands. *** She denies any pica, headaches, chest pain, dyspnea on exertion, or lightheadedness. *** She had IUD placed around October 2024, and while menorrhagia has resolved, she has had daily spotting for the past ***(2)*** months.  *** *** She denies any rectal bleeding or melena, but reports that stool is dark green from iron pill.  She has 75***% energy and 75***% appetite. She endorses that she is maintaining a stable weight.  ASSESSMENT & PLAN:  1.  Severe iron deficiency anemia: - Iron deficiency state caused by excessive menstrual bleeding, complicated by Xarelto (June-October 2024 for treatment of distal DVT) - She reported menorrhagia, with 14 days of bleeding every 28 days.  IUD placed in October 2024, with resolution of menorrhagia but daily spotting x 2*** months. - No BRBPR/melena.*** - Started taking iron tablet daily in September 2024.  Tolerating this well with stool softener. - Most recent IV iron with Venofer 400 mg x 1 on 06/01/2023.  Tolerates IV iron well with PREMEDICATION.  *** - Fatigue and restless legs resolved after IV iron.  *** - Most recent labs (***): Hgb ***/MCV  ***, ferritin ***, iron saturation ***% - PLAN: *** TBD.  *** We will hold off on IV iron at present, since she is not anemic and is asymptomatic even though ferritin is <100. - Recommend continuing daily iron supplement and rechecking iron panel in 3 months.  2.  Provoked right posterior tibial/peroneal vein DVT: - Patient reported right calf pain s/p right Achilles tendon surgery on 09/17/2022 - Vascular US right lower extremity (11/02/2022) was consistent with acute DVT involving right posterior tibial veins and right peroneal veins. - Treated with Xarelto x 3 months   3.  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  - Mother and sister have anemia. Maternal grandmother had ovarian cancer.   PLAN SUMMARY: *** TBD *** >> Same day labs (CBC/D, ferritin, iron/TIBC) + OFFICE visit in 3 months (MONDAY or TUESDAY only)     REVIEW OF SYSTEMS: ***  Review of Systems  Constitutional:  Negative for appetite change, chills, diaphoresis, fatigue, fever and unexpected weight change.  HENT:   Negative for lump/mass and nosebleeds.   Eyes:  Negative for eye problems.  Respiratory:  Negative for cough, hemoptysis and shortness of breath.   Cardiovascular:  Negative for chest pain, leg swelling and palpitations.  Gastrointestinal:  Positive for abdominal pain (S/p laparoscopic cholecystectomy 5 days ago) and constipation. Negative for blood in stool, diarrhea, nausea and vomiting.  Genitourinary:  Negative for hematuria.   Skin: Negative.   Neurological:  Positive for numbness. Negative for dizziness, headaches and light-headedness.  Hematological:  Does not bruise/bleed easily.  Psychiatric/Behavioral:  Positive for depression. The patient is nervous/anxious.      PHYSICAL EXAM:  ECOG PERFORMANCE STATUS: 1 - Symptomatic but completely ambulatory *** There were no vitals filed for this visit.  There were no vitals filed for this visit.  Physical Exam Constitutional:      Appearance:  Normal appearance. She is morbidly obese.  Cardiovascular:     Heart sounds: Normal heart sounds.  Pulmonary:     Breath sounds: Normal breath sounds.  Neurological:     General: No focal deficit present.     Mental Status: Mental status is at baseline.  Psychiatric:        Behavior: Behavior normal. Behavior is cooperative.     PAST MEDICAL/SURGICAL HISTORY:  Past Medical History:  Diagnosis Date   Anxiety    Anxiety    Depression    Depression    Diverticulosis    DVT (deep venous thrombosis) (HCC) 10/2022   right leg  patient was on xarelto for 3 months   Encounter for screening for HIV 07/08/2017   GERD (gastroesophageal reflux disease)    Hiatal hernia    Iron deficiency anemia    Polypharmacy 07/08/2017   PONV (postoperative nausea and vomiting)    Past Surgical History:  Procedure Laterality Date   ANKLE SURGERY Right 09/17/2022   CESAREAN SECTION     COLONOSCOPY     2017, normal.    ESOPHAGOGASTRODUODENOSCOPY     KNEE SURGERY  1981   TUBAL LIGATION      SOCIAL HISTORY:  Social History   Socioeconomic History   Marital status: Married    Spouse name: Dwayne   Number of children: 3   Years of education: Not on file   Highest education level: Professional school degree (e.g., MD, DDS, DVM, JD)  Occupational History   Not on file  Tobacco Use   Smoking status: Never    Passive exposure: Yes   Smokeless tobacco: Never  Vaping Use   Vaping status: Never Used  Substance and Sexual Activity   Alcohol use: Yes    Alcohol/week: 2.0 standard drinks of alcohol    Types: 2 Glasses of wine per week    Comment: very rarely   Drug use: No   Sexual activity: Yes    Birth control/protection: Surgical    Comment: tubal  Other Topics Concern   Not on file  Social History Narrative   Moved from [place], [place] in 01/2017. Had lived there for over 20 years. Moved with husband, son, and daughter.       Daughter is 48 y/o-married, expecting and lives in [place]    Daughter is 63 y/o married, has 2 children: 3 and 1 y/o living with her right now   Son is 28 y/o.      Daugther is 83 y/o- husband daughter-step daughter. Lives with her mother now.      Enjoys walking trials, and working out when she could, netflix and chill, and family      Diet: reports she use to meal prep, but has stopped, is supplementing with green powders, meats, fruits   Caffeine: 20 oz 3-4 time a week, no soda, tea- at times mixed sweet and unsweet   Water: 5-8 cups daily      Wears seatbelt.    Wears sunscreen.   Smoke detectors at home.   Does not use phone while driving.   Social Drivers of Health   Financial Resource Strain: Low Risk  (05/28/2023)  Overall Financial Resource Strain (CARDIA)    Difficulty of Paying Living Expenses: Not very hard  Food Insecurity: No Food Insecurity (05/28/2023)   Hunger Vital Sign    Worried About Running Out of Food in the Last Year: Never true    Ran Out of Food in the Last Year: Never true  Transportation Needs: No Transportation Needs (05/28/2023)   PRAPARE - Administrator, Civil Service (Medical): No    Lack of Transportation (Non-Medical): No  Physical Activity: Sufficiently Active (05/28/2023)   Exercise Vital Sign    Days of Exercise per Week: 3 days    Minutes of Exercise per Session: 60 min  Stress: Stress Concern Present (05/28/2023)   [employer] of Occupational Health - Occupational Stress Questionnaire    Feeling of Stress : To some extent  Social Connections: Moderately Integrated (05/28/2023)   Social Connection and Isolation Panel [NHANES]    Frequency of Communication with Friends and Family: Three times a week    Frequency of Social Gatherings with Friends and Family: Once a week    Attends Religious Services: 1 to 4 times per year    Active Member of [employer] or Organizations: No    Attends Engineer, structural: Not on file    Marital Status: Married  Catering manager Violence: Not At  Risk (01/14/2023)   Humiliation, Afraid, Rape, and Kick questionnaire    Fear of Current or Ex-Partner: No    Emotionally Abused: No    Physically Abused: No    Sexually Abused: No    FAMILY HISTORY:  Family History  Problem Relation Age of Onset   Colon polyps Mother    Suicidality Father    Hypertension Sister    Colon cancer Maternal Grandmother    Cancer Maternal Grandmother    Migraines Daughter    Drug abuse Other     CURRENT MEDICATIONS:  Outpatient Encounter Medications as of 07/20/2023  Medication Sig Note   acetaminophen (TYLENOL) 500 MG tablet Take 2 tablets (1,000 mg total) by mouth every 6 (six) hours. 06/01/2023: Bid    ARIPiprazole (ABILIFY) 5 MG tablet TAKE 1 TABLET(5 MG) BY MOUTH DAILY    buPROPion (WELLBUTRIN XL) 300 MG 24 hr tablet TAKE 1 TABLET(300 MG) BY MOUTH DAILY    busPIRone (BUSPAR) 10 MG tablet TAKE 1 TABLET(10 MG) BY MOUTH TWICE DAILY (Patient taking differently: Take 10 mg by mouth daily.)    cephALEXin (KEFLEX) 500 MG capsule Take 1 capsule (500 mg total) by mouth 3 (three) times daily.    docusate sodium (COLACE) 100 MG capsule Take 100 mg by mouth daily.    Ferrous Sulfate (IRON PO) Take 325 mg by mouth daily.    hydrOXYzine (VISTARIL) 25 MG capsule Take 1 capsule (25 mg total) by mouth every 8 (eight) hours as needed for anxiety.    Multiple Vitamin (MULTIVITAMIN) tablet Take 1 tablet by mouth daily. 03/24/2023: On hold per patient until patient purchases more   omeprazole (PRILOSEC) 40 MG capsule Take 1 capsule (40 mg total) by mouth daily.    ondansetron (ZOFRAN) 4 MG tablet Take 1 tablet (4 mg total) by mouth every 6 (six) hours as needed for nausea or vomiting.    Semaglutide,0.25 or 0.5MG /DOS, 2 MG/1.5ML SOPN Inject into the skin.    sucralfate (CARAFATE) 1 g tablet Take 1 g by mouth 4 (four) times daily as needed (upset stomach).    VITAMIN D PO Take 2,000 Units by mouth daily.  No facility-administered encounter medications on file as of  07/20/2023.    ALLERGIES:  Allergies  Allergen Reactions   Demerol [Meperidine Hcl] Anaphylaxis   Aspirin Other (See Comments)    Upset stomach   Nsaids     Pt reports has had "gi issues" lately and was told not to have steroid or NSAIDS prescribed at this time.    LABORATORY DATA:  I have reviewed the labs as listed.  CBC    Component Value Date/Time   WBC 7.7 05/24/2023 1305   RBC 4.51 05/24/2023 1305   HGB 14.0 05/24/2023 1305   HGB 9.4 (L) 01/08/2023 1557   HCT 42.8 05/24/2023 1305   HCT 30.5 (L) 01/08/2023 1557   PLT 256 05/24/2023 1305   PLT 327 01/08/2023 1557   MCV 94.9 05/24/2023 1305   MCV 79 01/08/2023 1557   MCH 31.0 05/24/2023 1305   MCHC 32.7 05/24/2023 1305   RDW 12.9 05/24/2023 1305   RDW 13.6 01/08/2023 1557   LYMPHSABS 2.5 03/30/2023 1423   LYMPHSABS 1.9 01/08/2023 1557   MONOABS 0.7 03/30/2023 1423   EOSABS 0.2 03/30/2023 1423   EOSABS 0.1 01/08/2023 1557   BASOSABS 0.1 03/30/2023 1423   BASOSABS 0.1 01/08/2023 1557      Latest Ref Rng & Units 02/23/2023    9:01 AM 01/08/2023    3:57 PM 04/13/2019   11:32 PM  CMP  Glucose 70 - 99 mg/dL  91  86   BUN 6 - 24 mg/dL  9  16   Creatinine 1.61 - 1.00 mg/dL  0.96  0.45   Sodium 409 - 144 mmol/L  139  140   Potassium 3.5 - 5.2 mmol/L  4.3  3.9   Chloride 96 - 106 mmol/L  104  109   CO2 20 - 29 mmol/L  23  23   Calcium 8.7 - 10.2 mg/dL  9.0  8.4   Total Protein 6.0 - 8.5 g/dL 6.1  6.4  6.3   Total Bilirubin 0.0 - 1.2 mg/dL <8.1  0.3  0.4   Alkaline Phos 44 - 121 IU/L 94  85  69   AST 0 - 40 IU/L 12  19  13    ALT 0 - 32 IU/L 23  32  22     DIAGNOSTIC IMAGING:  I have independently reviewed the relevant imaging and discussed with the patient.   WRAP UP:  All questions were answered. The patient knows to call the clinic with any problems, questions or concerns.  Medical decision making: Moderate***  Time spent on visit: I spent *** minutes counseling the patient face to face. The total time  spent in the appointment was *** minutes and more than 50% was on counseling.  Carnella Guadalajara, PA-C  ***

## 2023-07-20 ENCOUNTER — Inpatient Hospital Stay: Payer: BC Managed Care – PPO

## 2023-07-20 ENCOUNTER — Inpatient Hospital Stay: Payer: BC Managed Care – PPO | Attending: Hematology | Admitting: Physician Assistant

## 2023-07-20 VITALS — BP 130/83 | HR 71 | Temp 98.2°F | Resp 16 | Wt 201.7 lb

## 2023-07-20 DIAGNOSIS — Z8041 Family history of malignant neoplasm of ovary: Secondary | ICD-10-CM | POA: Insufficient documentation

## 2023-07-20 DIAGNOSIS — D500 Iron deficiency anemia secondary to blood loss (chronic): Secondary | ICD-10-CM | POA: Diagnosis not present

## 2023-07-20 DIAGNOSIS — Z86718 Personal history of other venous thrombosis and embolism: Secondary | ICD-10-CM | POA: Insufficient documentation

## 2023-07-20 DIAGNOSIS — N920 Excessive and frequent menstruation with regular cycle: Secondary | ICD-10-CM | POA: Insufficient documentation

## 2023-07-20 DIAGNOSIS — M25571 Pain in right ankle and joints of right foot: Secondary | ICD-10-CM | POA: Diagnosis not present

## 2023-07-20 LAB — FERRITIN: Ferritin: 94 ng/mL (ref 11–307)

## 2023-07-20 LAB — CBC WITH DIFFERENTIAL/PLATELET
Abs Immature Granulocytes: 0.02 10*3/uL (ref 0.00–0.07)
Basophils Absolute: 0.1 10*3/uL (ref 0.0–0.1)
Basophils Relative: 1 %
Eosinophils Absolute: 0.1 10*3/uL (ref 0.0–0.5)
Eosinophils Relative: 1 %
HCT: 45.1 % (ref 36.0–46.0)
Hemoglobin: 14.6 g/dL (ref 12.0–15.0)
Immature Granulocytes: 0 %
Lymphocytes Relative: 34 %
Lymphs Abs: 3 10*3/uL (ref 0.7–4.0)
MCH: 30.5 pg (ref 26.0–34.0)
MCHC: 32.4 g/dL (ref 30.0–36.0)
MCV: 94.4 fL (ref 80.0–100.0)
Monocytes Absolute: 0.7 10*3/uL (ref 0.1–1.0)
Monocytes Relative: 8 %
Neutro Abs: 5.1 10*3/uL (ref 1.7–7.7)
Neutrophils Relative %: 56 %
Platelets: 300 10*3/uL (ref 150–400)
RBC: 4.78 MIL/uL (ref 3.87–5.11)
RDW: 12.7 % (ref 11.5–15.5)
WBC: 9 10*3/uL (ref 4.0–10.5)
nRBC: 0 % (ref 0.0–0.2)

## 2023-07-20 LAB — IRON AND TIBC
Iron: 76 ug/dL (ref 28–170)
Saturation Ratios: 24 % (ref 10.4–31.8)
TIBC: 318 ug/dL (ref 250–450)
UIBC: 242 ug/dL

## 2023-07-26 DIAGNOSIS — M25571 Pain in right ankle and joints of right foot: Secondary | ICD-10-CM | POA: Diagnosis not present

## 2023-08-04 ENCOUNTER — Other Ambulatory Visit: Payer: Self-pay | Admitting: Nurse Practitioner

## 2023-08-04 MED ORDER — ONDANSETRON HCL 4 MG PO TABS: 4.0000 mg | ORAL_TABLET | Freq: Four times a day (QID) | ORAL | 0 refills | Status: DC | PRN

## 2023-08-19 ENCOUNTER — Other Ambulatory Visit: Payer: Self-pay | Admitting: *Deleted

## 2023-08-19 ENCOUNTER — Encounter: Payer: Self-pay | Admitting: Physician Assistant

## 2023-08-19 DIAGNOSIS — D500 Iron deficiency anemia secondary to blood loss (chronic): Secondary | ICD-10-CM

## 2023-08-23 ENCOUNTER — Inpatient Hospital Stay: Attending: Hematology

## 2023-08-23 DIAGNOSIS — Z86718 Personal history of other venous thrombosis and embolism: Secondary | ICD-10-CM | POA: Insufficient documentation

## 2023-08-23 DIAGNOSIS — Z7901 Long term (current) use of anticoagulants: Secondary | ICD-10-CM | POA: Insufficient documentation

## 2023-08-23 DIAGNOSIS — B351 Tinea unguium: Secondary | ICD-10-CM | POA: Diagnosis not present

## 2023-08-23 DIAGNOSIS — M79674 Pain in right toe(s): Secondary | ICD-10-CM | POA: Diagnosis not present

## 2023-08-23 DIAGNOSIS — N920 Excessive and frequent menstruation with regular cycle: Secondary | ICD-10-CM | POA: Insufficient documentation

## 2023-08-23 DIAGNOSIS — D500 Iron deficiency anemia secondary to blood loss (chronic): Secondary | ICD-10-CM | POA: Diagnosis not present

## 2023-08-23 LAB — CBC WITH DIFFERENTIAL/PLATELET
Abs Immature Granulocytes: 0.02 10*3/uL (ref 0.00–0.07)
Basophils Absolute: 0 10*3/uL (ref 0.0–0.1)
Basophils Relative: 0 %
Eosinophils Absolute: 0.1 10*3/uL (ref 0.0–0.5)
Eosinophils Relative: 2 %
HCT: 43.3 % (ref 36.0–46.0)
Hemoglobin: 14.5 g/dL (ref 12.0–15.0)
Immature Granulocytes: 0 %
Lymphocytes Relative: 27 %
Lymphs Abs: 1.9 10*3/uL (ref 0.7–4.0)
MCH: 31.5 pg (ref 26.0–34.0)
MCHC: 33.5 g/dL (ref 30.0–36.0)
MCV: 93.9 fL (ref 80.0–100.0)
Monocytes Absolute: 0.6 10*3/uL (ref 0.1–1.0)
Monocytes Relative: 8 %
Neutro Abs: 4.6 10*3/uL (ref 1.7–7.7)
Neutrophils Relative %: 63 %
Platelets: 255 10*3/uL (ref 150–400)
RBC: 4.61 MIL/uL (ref 3.87–5.11)
RDW: 12.4 % (ref 11.5–15.5)
WBC: 7.2 10*3/uL (ref 4.0–10.5)
nRBC: 0 % (ref 0.0–0.2)

## 2023-08-23 LAB — IRON AND TIBC
Iron: 76 ug/dL (ref 28–170)
Saturation Ratios: 27 % (ref 10.4–31.8)
TIBC: 282 ug/dL (ref 250–450)
UIBC: 206 ug/dL

## 2023-08-23 LAB — FERRITIN: Ferritin: 85 ng/mL (ref 11–307)

## 2023-08-24 ENCOUNTER — Other Ambulatory Visit: Payer: Self-pay | Admitting: Physician Assistant

## 2023-08-24 NOTE — Progress Notes (Signed)
 Patient messaged to report symptoms of iron deficiency.  Ferritin remains <100.  Will proceed with IV Venofer 400 mg x 1.  Sheril Dines PA-C 08/24/2023 11:37 AM

## 2023-08-29 ENCOUNTER — Encounter (HOSPITAL_COMMUNITY): Payer: Self-pay | Admitting: Emergency Medicine

## 2023-08-29 ENCOUNTER — Other Ambulatory Visit: Payer: Self-pay

## 2023-08-29 ENCOUNTER — Emergency Department (HOSPITAL_COMMUNITY)
Admission: EM | Admit: 2023-08-29 | Discharge: 2023-08-29 | Disposition: A | Discharge status: Home / Self Care | Attending: Emergency Medicine | Admitting: Emergency Medicine

## 2023-08-29 DIAGNOSIS — H5789 Other specified disorders of eye and adnexa: Secondary | ICD-10-CM | POA: Diagnosis not present

## 2023-08-29 DIAGNOSIS — H1031 Unspecified acute conjunctivitis, right eye: Secondary | ICD-10-CM | POA: Diagnosis not present

## 2023-08-29 DIAGNOSIS — H109 Unspecified conjunctivitis: Secondary | ICD-10-CM | POA: Diagnosis not present

## 2023-08-29 MED ORDER — MOXIFLOXACIN HCL 0.5 % OP SOLN: 1.0000 [drp] | Freq: Three times a day (TID) | OPHTHALMIC | 0 refills | Status: DC

## 2023-08-29 MED ORDER — TETRACAINE HCL 0.5 % OP SOLN
2.0000 [drp] | Freq: Once | OPHTHALMIC | Status: AC
  Administered 2023-08-29: 2 [drp] via OPHTHALMIC
  Filled 2023-08-29: qty 4

## 2023-08-29 MED ORDER — FLUORESCEIN SODIUM 1 MG OP STRP
1.0000 | ORAL_STRIP | Freq: Once | OPHTHALMIC | Status: AC
  Administered 2023-08-29: 1 via OPHTHALMIC
  Filled 2023-08-29: qty 1

## 2023-08-29 NOTE — ED Provider Notes (Signed)
 [place] EMERGENCY DEPARTMENT AT [facility] Provider Note   CSN: [id number] Arrival date & time: 08/29/23  1523     History  Chief Complaint  Patient presents with   Eye Problem    Patricia Becker is a 48 y.o. female.  48 year old female presenting with discharge and irritation of the right eye.  Patient woke up from her nap this afternoon and noticed she was having a "jellylike" clear discharge from her right eye along with redness.  She describes her eye as feeling "irritated", she does note that she was rubbing her eyes pretty aggressively before taking a nap but she does not believe that she scratched her eye.  She denies changes in vision, no pain with movement of her eye, no seasonal allergies, no history of glaucoma.  She has a history of styes, but does not believe that she has had any lately.   Eye Problem      Home Medications Prior to Admission medications   Medication Sig Start Date End Date Taking? Authorizing Provider  moxifloxacin  (VIGAMOX ) 0.5 % ophthalmic solution Place 1 drop into the right eye 3 (three) times daily for 5 days. 08/29/23 09/03/23 Yes Maury Groninger, Angelita Kendall, PA-C  acetaminophen  (TYLENOL ) 500 MG tablet Take 2 tablets (1,000 mg total) by mouth every 6 (six) hours. 04/14/23   Pappayliou, Bynum Cassis A, DO  ARIPiprazole  (ABILIFY ) 5 MG tablet TAKE 1 TABLET(5 MG) BY MOUTH DAILY 05/25/23   Hoskins, Carolyn C, NP  buPROPion  (WELLBUTRIN  XL) 300 MG 24 hr tablet TAKE 1 TABLET(300 MG) BY MOUTH DAILY 05/18/23   Cook, Jayce G, DO  busPIRone  (BUSPAR ) 10 MG tablet TAKE 1 TABLET(10 MG) BY MOUTH TWICE DAILY Patient taking differently: Take 10 mg by mouth daily. 02/19/23   Derenda Flax, NP  docusate sodium (COLACE) 100 MG capsule Take 100 mg by mouth daily.    [provider]  doxycycline (VIBRAMYCIN) 100 MG capsule Take 100 mg by mouth 2 (two) times daily. 07/19/23   [provider]  Ferrous Sulfate (IRON  PO) Take 325 mg by mouth daily.     [provider]  hydrOXYzine  (VISTARIL ) 25 MG capsule Take 1 capsule (25 mg total) by mouth every 8 (eight) hours as needed for anxiety. 01/21/22   Cook, Jayce G, DO  Multiple Vitamin (MULTIVITAMIN) tablet Take 1 tablet by mouth daily.    [provider]  omeprazole  (PRILOSEC) 40 MG capsule Take 1 capsule (40 mg total) by mouth daily. 05/24/23   Cook, Jayce G, DO  ondansetron  (ZOFRAN ) 4 MG tablet Take 1 tablet (4 mg total) by mouth every 6 (six) hours as needed for nausea or vomiting. 08/04/23   Derenda Flax, NP  Semaglutide,0.25 or 0.5MG /DOS, 2 MG/1.5ML SOPN Inject into the skin.    [provider]  silver sulfADIAZINE (SILVADENE) 1 % cream Apply topically. 07/19/23   [provider]  sucralfate (CARAFATE) 1 g tablet Take 1 g by mouth 4 (four) times daily as needed (upset stomach).    [provider]  VITAMIN D  PO Take 2,000 Units by mouth daily.    [provider]      Allergies    Demerol [meperidine hcl], Aspirin, and Nsaids    Review of Systems   Review of Systems  Physical Exam Updated Vital Signs BP (!) 113/56 (BP Location: Right Arm)   Pulse 75   Temp 97.8 F (36.6 C) (Oral)   Resp 18   Ht 5' (1.524 m)   Wt  86.2 kg   SpO2 99%   BMI 37.11 kg/m  Physical Exam Vitals and nursing note reviewed.  HENT:     Head: Normocephalic and atraumatic.  Eyes:     General: Lids are everted, no foreign bodies appreciated.        Right eye: Discharge (thick, clear) present.        Left eye: No discharge.     Intraocular pressure: Right eye pressure is 14 mmHg. Left eye pressure is 16 mmHg. Measurements were taken using a handheld tonometer.    Extraocular Movements: Extraocular movements intact.     Conjunctiva/sclera:     Right eye: Right conjunctiva is injected.     Pupils: Pupils are equal, round, and reactive to light.     Comments: Full EOM's without pain.   Trace edema of the skin superior and inferior to medial  canthus Visual acuity with correction: - OD: 20/20 - OS: 20/25 No corneal uptake of fluorescein  dye in the right eye  Pulmonary:     Effort: Pulmonary effort is normal.  Neurological:     Mental Status: She is alert.     ED Results / Procedures / Treatments   Labs (all labs ordered are listed, but only abnormal results are displayed) Labs Reviewed - No data to display  EKG None  Radiology No results found.  Procedures Procedures    Medications Ordered in ED Medications  fluorescein  ophthalmic strip 1 strip (1 strip Right Eye Given 08/29/23 1845)  tetracaine  (PONTOCAINE) 0.5 % ophthalmic solution 2 drop (2 drops Both Eyes Given 08/29/23 1845)    ED Course/ Medical Decision Making/ A&P                                 Medical Decision Making 48 year old female presenting with right eye discharge and irritation.  Differential diagnosis includes allergic conjunctivitis, viral conjunctivitis, bacterial conjunctivitis, corneal abrasion, corneal ulcer.  Vision is intact, 20/20 in the right eye and 20/25 in the left eye with glasses on.  Intraocular pressures are within normal limits, 14 in the right eye and 16 in the left eye.  Physical exam is notable for scleral injection with clear discharge of the right eye.  No corneal uptake of fluorescein , low suspicion for corneal abrasion, corneal ulcer, other corneal defect.  Low suspicion for acute angle-closure glaucoma given patient's normal intraocular pressures, no eye pain, no changes in vision, equally reactive pupils.  Symptoms are likely attributed to unspecified conjunctivitis, low suspicion for bacterial given drainage is clear other than purulent.  Start olopatadine 1 to 2 drops/day in the right eye, if no improvement switch to moxifloxacin  drops, 1 drop administered to the right eye 3 times daily.  Apply cool compresses to the right eye.  Follow-up with optometry as needed  Risk Prescription drug  management.           Final Clinical Impression(s) / ED Diagnoses Final diagnoses:  Conjunctivitis of right eye, unspecified conjunctivitis type    Rx / DC Orders ED Discharge Orders          Ordered    moxifloxacin  (VIGAMOX ) 0.5 % ophthalmic solution  3 times daily        08/29/23 1856              Adolm Ahumada 08/29/23 1901    Early Glisson, MD 08/30/23 1538

## 2023-08-29 NOTE — Discharge Instructions (Signed)
 Apply cold compresses as needed to the right eye.  Start olopatadine 1 to 2 drops daily in the right eye, if no improvement switch to moxifloxacin  1 drop into the right eye 3 times daily.  Follow-up with optometry as needed.

## 2023-08-29 NOTE — ED Triage Notes (Signed)
 Pt to ER with c/o eye drainage and sclera edema.  Pt states symptoms started this AM.  Denies known injury.

## 2023-08-31 ENCOUNTER — Encounter: Payer: Self-pay | Admitting: Nurse Practitioner

## 2023-08-31 ENCOUNTER — Inpatient Hospital Stay

## 2023-08-31 VITALS — BP 112/74 | HR 69 | Temp 98.5°F | Resp 16

## 2023-08-31 DIAGNOSIS — Z86718 Personal history of other venous thrombosis and embolism: Secondary | ICD-10-CM | POA: Diagnosis not present

## 2023-08-31 DIAGNOSIS — Z7901 Long term (current) use of anticoagulants: Secondary | ICD-10-CM | POA: Diagnosis not present

## 2023-08-31 DIAGNOSIS — N920 Excessive and frequent menstruation with regular cycle: Secondary | ICD-10-CM | POA: Diagnosis not present

## 2023-08-31 DIAGNOSIS — D500 Iron deficiency anemia secondary to blood loss (chronic): Secondary | ICD-10-CM | POA: Diagnosis not present

## 2023-08-31 MED ORDER — SODIUM CHLORIDE 0.9 % IV SOLN: INTRAVENOUS | Status: DC

## 2023-08-31 MED ORDER — METHYLPREDNISOLONE SODIUM SUCC 125 MG IJ SOLR
125.0000 mg | Freq: Once | INTRAMUSCULAR | Status: AC
  Administered 2023-08-31: 125 mg via INTRAVENOUS
  Filled 2023-08-31: qty 2

## 2023-08-31 MED ORDER — CETIRIZINE HCL 10 MG PO TABS
10.0000 mg | ORAL_TABLET | Freq: Once | ORAL | Status: AC
  Administered 2023-08-31: 10 mg via ORAL
  Filled 2023-08-31: qty 1

## 2023-08-31 MED ORDER — ACETAMINOPHEN 325 MG PO TABS
650.0000 mg | ORAL_TABLET | Freq: Once | ORAL | Status: AC
  Administered 2023-08-31: 650 mg via ORAL
  Filled 2023-08-31: qty 2

## 2023-08-31 MED ORDER — FAMOTIDINE IN NACL 20-0.9 MG/50ML-% IV SOLN
20.0000 mg | Freq: Once | INTRAVENOUS | Status: AC
  Administered 2023-08-31: 20 mg via INTRAVENOUS
  Filled 2023-08-31: qty 50

## 2023-08-31 MED ORDER — SODIUM CHLORIDE 0.9 % IV SOLN
400.0000 mg | Freq: Once | INTRAVENOUS | Status: AC
  Administered 2023-08-31: 400 mg via INTRAVENOUS
  Filled 2023-08-31: qty 400

## 2023-08-31 NOTE — Patient Instructions (Signed)
 [facility] - A DEPT OF [facility]  Discharge Instructions: Thank you for choosing [facility] to provide your oncology and hematology care.  If you have a lab appointment with the Cancer Center - please note that after April 8th, 2024, all labs will be drawn in the cancer center.  You do not have to check in or register with the main entrance as you have in the past but will complete your check-in in the cancer center.  Wear comfortable clothing and clothing appropriate for easy access to any Portacath or PICC line.   We strive to give you quality time with your provider. You may need to reschedule your appointment if you arrive late (15 or more minutes).  Arriving late affects you and other patients whose appointments are after yours.  Also, if you miss three or more appointments without notifying the office, you may be dismissed from the clinic at the provider's discretion.      For prescription refill requests, have your pharmacy contact our office and allow 72 hours for refills to be completed.    Today you received the following infusion : Venofer  iron  infusion   To help prevent nausea and vomiting after your treatment, we encourage you to take your nausea medication as directed.  BELOW ARE SYMPTOMS THAT SHOULD BE REPORTED IMMEDIATELY: *FEVER GREATER THAN 100.4 F (38 C) OR HIGHER *CHILLS OR SWEATING *NAUSEA AND VOMITING THAT IS NOT CONTROLLED WITH YOUR NAUSEA MEDICATION *UNUSUAL SHORTNESS OF BREATH *UNUSUAL BRUISING OR BLEEDING *URINARY PROBLEMS (pain or burning when urinating, or frequent urination) *BOWEL PROBLEMS (unusual diarrhea, constipation, pain near the anus) TENDERNESS IN MOUTH AND THROAT WITH OR WITHOUT PRESENCE OF ULCERS (sore throat, sores in mouth, or a toothache) UNUSUAL RASH, SWELLING OR PAIN  UNUSUAL VAGINAL DISCHARGE OR ITCHING   Items with * indicate a potential emergency and should be followed up as soon as possible  or go to the Emergency Department if any problems should occur.  Please show the CHEMOTHERAPY ALERT CARD or IMMUNOTHERAPY ALERT CARD at check-in to the Emergency Department and triage nurse.  Should you have questions after your visit or need to cancel or reschedule your appointment, please contact [facility] [place] - A DEPT OF [facility] [phone]  and follow the prompts.  Office hours are 8:00 a.m. to 4:30 p.m. Monday - Friday. Please note that voicemails left after 4:00 p.m. may not be returned until the following business day.  We are closed weekends and major holidays. You have access to a nurse at all times for urgent questions. Please call the main number to the clinic [phone] and follow the prompts.  For any non-urgent questions, you may also contact your provider using MyChart. We now offer e-Visits for anyone 64 and older to request care online for non-urgent symptoms. For details visit [email or web address].   Also download the MyChart app! Go to the app store, search "MyChart", open the app, select , and log in with your MyChart username and password.

## 2023-08-31 NOTE — Progress Notes (Signed)
Venofer iron infusion given per orders. Patient tolerated it well without problems. Vitals stable and discharged home from clinic ambulatory. Follow up as scheduled.

## 2023-09-01 ENCOUNTER — Other Ambulatory Visit: Payer: Self-pay | Admitting: Family Medicine

## 2023-09-01 DIAGNOSIS — K219 Gastro-esophageal reflux disease without esophagitis: Secondary | ICD-10-CM

## 2023-09-01 DIAGNOSIS — F419 Anxiety disorder, unspecified: Secondary | ICD-10-CM

## 2023-09-02 ENCOUNTER — Other Ambulatory Visit: Payer: Self-pay | Admitting: Nurse Practitioner

## 2023-09-10 ENCOUNTER — Other Ambulatory Visit: Payer: Self-pay

## 2023-09-10 DIAGNOSIS — F32A Depression, unspecified: Secondary | ICD-10-CM

## 2023-09-12 MED ORDER — ARIPIPRAZOLE 5 MG PO TABS: ORAL_TABLET | ORAL | 0 refills | Status: DC

## 2023-09-13 DIAGNOSIS — B351 Tinea unguium: Secondary | ICD-10-CM | POA: Diagnosis not present

## 2023-09-13 DIAGNOSIS — M79675 Pain in left toe(s): Secondary | ICD-10-CM | POA: Diagnosis not present

## 2023-09-13 DIAGNOSIS — L600 Ingrowing nail: Secondary | ICD-10-CM | POA: Diagnosis not present

## 2023-09-24 ENCOUNTER — Encounter: Payer: Self-pay | Admitting: Nurse Practitioner

## 2023-09-27 DIAGNOSIS — B351 Tinea unguium: Secondary | ICD-10-CM | POA: Diagnosis not present

## 2023-09-27 DIAGNOSIS — L600 Ingrowing nail: Secondary | ICD-10-CM | POA: Diagnosis not present

## 2023-09-28 ENCOUNTER — Ambulatory Visit: Admitting: Nurse Practitioner

## 2023-09-28 ENCOUNTER — Encounter: Payer: Self-pay | Admitting: Nurse Practitioner

## 2023-09-28 VITALS — BP 105/72 | HR 70 | Temp 97.7°F | Ht 60.0 in | Wt 186.0 lb

## 2023-09-28 DIAGNOSIS — F419 Anxiety disorder, unspecified: Secondary | ICD-10-CM | POA: Diagnosis not present

## 2023-09-28 DIAGNOSIS — F32A Depression, unspecified: Secondary | ICD-10-CM | POA: Diagnosis not present

## 2023-09-28 DIAGNOSIS — E669 Obesity, unspecified: Secondary | ICD-10-CM

## 2023-09-28 NOTE — Progress Notes (Signed)
 Subjective:    Patient ID: Patricia Becker, female    DOB: 29-Nov-1975, 48 y.o.   MRN: [id number]  HPI Presents to discuss medication options for weight loss.  Recent changes from the FDA stopped compounding GLP-1 medications.  Purchasing out-of-pocket is too expensive, not covered by her insurance.  Would like to look at other options.  Continues to eat a healthy diet, following the weight watchers program.  Has been very active going to the gym 3 days a week, this has decreased briefly due to toenail removal on both great toes.  Plans to pick this back up.  Of note she has taken phentermine in the past without difficulty. Also of note patient is doing very well on her current regimen for anxiety and depression.  States she is feeling much better.  Review of Systems  Respiratory:  Negative for cough, chest tightness, shortness of breath and wheezing.   Cardiovascular:  Negative for palpitations.      09/28/2023    8:32 AM  Depression screen PHQ 2/9  Decreased Interest 0  Down, Depressed, Hopeless 0  PHQ - 2 Score 0  Altered sleeping 1  Tired, decreased energy 1  Change in appetite 0  Feeling bad or failure about yourself  0  Trouble concentrating 0  Moving slowly or fidgety/restless 0  Suicidal thoughts 0  PHQ-9 Score 2  Difficult doing work/chores Not difficult at all      09/28/2023    8:32 AM 06/28/2023    3:40 PM 02/19/2023    3:06 PM 01/08/2023    3:33 PM  GAD 7 : Generalized Anxiety Score  Nervous, Anxious, on Edge 0 0 1 1  Control/stop worrying 0 0 1 2  Worry too much - different things 0 0 1 2  Trouble relaxing 0 0 0 2  Restless 0 0 0 2  Easily annoyed or irritable 0 0 1 2  Afraid - awful might happen 0 0 1 1  Total GAD 7 Score 0 0 5 12  Anxiety Difficulty Not difficult at all Not difficult at all Somewhat difficult Somewhat difficult         Objective:   Physical Exam NAD.  Alert, oriented.  Calm cheerful affect.  Making good eye contact.  Dressed  appropriately for the weather.  Speech clear.  Thoughts logical coherent and relevant.  Lungs clear.  Heart regular rate rhythm. Today's Vitals   09/28/23 0821  BP: 105/72  Pulse: 70  Temp: 97.7 F (36.5 C)  SpO2: 99%  Weight: 186 lb (84.4 kg)  Height: 5' (1.524 m)   Body mass index is 36.33 kg/m.  Her weight in January was 213 pounds.       Assessment & Plan:   Problem List Items Addressed This Visit       Other   Anxiety and depression - Primary   Obesity (BMI 30-39.9)   Discussed medication options specifically phentermine and Qsymia.  If Qsymia can be paid for out-of-pocket reasonable cost we will try this first.  Discussed potential adverse effects.  Patient has had tubal ligation and has no plans for pregnancy. Meds ordered this encounter  Medications   Phentermine-Topiramate (QSYMIA) 3.75-23 MG CP24    Sig: Take one cap po qam x 2 weeks    Dispense:  14 capsule    Refill:  0    Supervising Provider:   Charlotta Cook A [[id number]]   Phentermine-Topiramate (QSYMIA) 7.5-46 MG CP24    Sig: Take one cap  po qam    Dispense:  30 capsule    Refill:  0    Supervising Provider:   Charlotta Cook A [[id number]]   Start Qsymia as directed. Reviewed potential adverse effects. DC med and contact office if any problems.  Continue healthy diet and activity.  Return in about 3 months (around 12/29/2023).

## 2023-09-29 MED ORDER — PHENTERMINE-TOPIRAMATE ER 7.5-46 MG PO CP24: ORAL_CAPSULE | ORAL | 0 refills | Status: DC

## 2023-09-29 MED ORDER — QSYMIA 3.75-23 MG PO CP24: ORAL_CAPSULE | ORAL | 0 refills | Status: DC

## 2023-10-13 ENCOUNTER — Encounter: Payer: Self-pay | Admitting: Nurse Practitioner

## 2023-10-20 NOTE — Progress Notes (Signed)
 [facility] [address], [place] [address]   CLINIC:  Medical Oncology/Hematology  PCP:  Cook, Jayce G, DO [address] [place] [address] [phone]   REASON FOR VISIT:  Follow-up for iron  deficiency anemia  CURRENT THERAPY: Intermittent IV iron   INTERVAL HISTORY:   Ms. Brines 48 y.o. female returns for routine follow-up of iron  deficiency anemia.  She was last seen by Sheril Dines PA-C on 07/20/2023.  She contacted clinic in April 2025 to report some increased symptoms of iron  deficiency, and repeat labs showed ferritin <100, therefore she was scheduled for Venofer  400 mg x 1 (given on 08/31/2023).   She felt improved energy after IV iron , with resolution of restless legs and decreased paresthesias in hands. She denies any pica, headaches, chest pain, dyspnea on exertion. She does have some lightheadedness after finishing her shift at work. She continues to have moderate menstrual bleeding lasting 5 out of every 28 days.   She denies any rectal bleeding or melena, but reports that stool is dark green from iron  pill. She has 80% energy and 80% appetite. She endorses that she is maintaining a stable weight.  ASSESSMENT & PLAN:  1.  Severe iron  deficiency anemia: - Iron  deficiency state caused by excessive menstrual bleeding, complicated by Xarelto  (June-October 2024 for treatment of distal DVT) - IUD from October 2024 through January 2025.  Since removal of IUD in January 2025, periods have been 5 out of every 28 days with moderate bleeding - No BRBPR/melena. - Started taking iron  tablet daily in September 2024.  Tolerating this well with stool softener. - Most recent IV iron  with Venofer  400 mg x 1 on 08/31/2023.  Tolerates IV iron  well with PREMEDICATION.   - Fatigue paresthesias, and restless legs resolved after IV iron .   - Most recent labs (10/25/23): Hgb 14.4/MCV 95.8, ferritin 123, iron  saturation 26% - PLAN: No IV iron  needed.   Recommend continuing daily iron  supplement and rechecking iron  panel in 6 months.  She can return earlier for repeat labs if she has any recurrent symptoms of iron  deficiency.  2.  Provoked right posterior tibial/peroneal vein DVT: - Patient reported right calf pain s/p right Achilles tendon surgery on 09/17/2022 - Vascular US  right lower extremity (11/02/2022) was consistent with acute DVT involving right posterior tibial veins and right peroneal veins. - Treated with Xarelto  x 3 months   3.  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  - Mother and sister have anemia. Maternal grandmother had ovarian cancer.   PLAN SUMMARY: >> Same day labs (CBC/D, ferritin, iron /TIBC) + OFFICE visit in 6 months (MONDAY or TUESDAY only)     REVIEW OF SYSTEMS:   Review of Systems  Constitutional:  Negative for appetite change, chills, diaphoresis, fatigue, fever and unexpected weight change.  HENT:   Negative for lump/mass and nosebleeds.   Eyes:  Negative for eye problems.  Respiratory:  Negative for cough, hemoptysis and shortness of breath.   Cardiovascular:  Negative for chest pain, leg swelling and palpitations.  Gastrointestinal:  Negative for abdominal pain, blood in stool, constipation, diarrhea, nausea and vomiting.  Genitourinary:  Negative for hematuria.   Skin: Negative.   Neurological:  Positive for light-headedness. Negative for dizziness, headaches and numbness.  Hematological:  Does not bruise/bleed easily.  Psychiatric/Behavioral:  Negative for depression. The patient is not nervous/anxious.      PHYSICAL EXAM:  ECOG PERFORMANCE STATUS: 1 - Symptomatic but completely ambulatory  Vitals:   10/25/23 1035  BP: (!) 117/58  Pulse: 76  Resp: 18  Temp: 98.3 F (36.8 C)  SpO2: 98%   Filed Weights   10/25/23 1035  Weight: 184 lb 1.6 oz (83.5 kg)   Physical Exam Constitutional:      Appearance: Normal appearance. She is obese.   Cardiovascular:     Heart sounds: Normal  heart sounds.  Pulmonary:     Breath sounds: Normal breath sounds.   Neurological:     General: No focal deficit present.     Mental Status: Mental status is at baseline.   Psychiatric:        Behavior: Behavior normal. Behavior is cooperative.    PAST MEDICAL/SURGICAL HISTORY:  Past Medical History:  Diagnosis Date   Adult BMI 40.0-44.9 kg/sq m (HCC) 03/01/2023   Anxiety    Anxiety    Depression    Depression    Diverticulosis    DVT (deep venous thrombosis) (HCC) 10/2022   right leg  patient was on xarelto  for 3 months   Encounter for screening for HIV 07/08/2017   GERD (gastroesophageal reflux disease)    Hiatal hernia    Iron  deficiency anemia    Polypharmacy 07/08/2017   PONV (postoperative nausea and vomiting)    Past Surgical History:  Procedure Laterality Date   ANKLE SURGERY Right 09/17/2022   CESAREAN SECTION     COLONOSCOPY     2017, normal.    ESOPHAGOGASTRODUODENOSCOPY     KNEE SURGERY  1981   TUBAL LIGATION      SOCIAL HISTORY:  Social History   Socioeconomic History   Marital status: Married    Spouse name: Dwayne   Number of children: 3   Years of education: Not on file   Highest education level: Professional school degree (e.g., MD, DDS, DVM, JD)  Occupational History   Not on file  Tobacco Use   Smoking status: Never    Passive exposure: Yes   Smokeless tobacco: Never  Vaping Use   Vaping status: Never Used  Substance and Sexual Activity   Alcohol use: Yes    Alcohol/week: 2.0 standard drinks of alcohol    Types: 2 Glasses of wine per week    Comment: very rarely   Drug use: No   Sexual activity: Yes    Birth control/protection: Surgical    Comment: tubal  Other Topics Concern   Not on file  Social History Narrative   Moved from [place], [place] in 01/2017. Had lived there for over 20 years. Moved with husband, son, and daughter.       Daughter is 40 y/o-married, expecting and lives in [place]   Daughter is 63 y/o married, has 2  children: 3 and 1 y/o living with her right now   Son is 76 y/o.      Daugther is 28 y/o- husband daughter-step daughter. Lives with her mother now.      Enjoys walking trials, and working out when she could, netflix and chill, and family      Diet: reports she use to meal prep, but has stopped, is supplementing with green powders, meats, fruits   Caffeine: 20 oz 3-4 time a week, no soda, tea- at times mixed sweet and unsweet   Water : 5-8 cups daily      Wears seatbelt.    Wears sunscreen.   Smoke detectors at home.   Does not use phone while driving.   Social Drivers of Health  Financial Resource Strain: Low Risk  (05/28/2023)   Overall Financial Resource Strain (CARDIA)    Difficulty of Paying Living Expenses: Not very hard  Food Insecurity: No Food Insecurity (05/28/2023)   Hunger Vital Sign    Worried About Running Out of Food in the Last Year: Never true    Ran Out of Food in the Last Year: Never true  Transportation Needs: No Transportation Needs (05/28/2023)   PRAPARE - Administrator, Civil Service (Medical): No    Lack of Transportation (Non-Medical): No  Physical Activity: Sufficiently Active (05/28/2023)   Exercise Vital Sign    Days of Exercise per Week: 3 days    Minutes of Exercise per Session: 60 min  Stress: Stress Concern Present (05/28/2023)   [employer] of Occupational Health - Occupational Stress Questionnaire    Feeling of Stress : To some extent  Social Connections: Moderately Integrated (05/28/2023)   Social Connection and Isolation Panel    Frequency of Communication with Friends and Family: Three times a week    Frequency of Social Gatherings with Friends and Family: Once a week    Attends Religious Services: 1 to 4 times per year    Active Member of [employer] or Organizations: No    Attends Engineer, structural: Not on file    Marital Status: Married  Catering manager Violence: Not At Risk (01/14/2023)   Humiliation, Afraid, Rape,  and Kick questionnaire    Fear of Current or Ex-Partner: No    Emotionally Abused: No    Physically Abused: No    Sexually Abused: No    FAMILY HISTORY:  Family History  Problem Relation Age of Onset   Colon polyps Mother    Suicidality Father    Hypertension Sister    Colon cancer Maternal Grandmother    Cancer Maternal Grandmother    Migraines Daughter    Drug abuse Other     CURRENT MEDICATIONS:  Outpatient Encounter Medications as of 10/25/2023  Medication Sig Note   acetaminophen  (TYLENOL ) 500 MG tablet Take 2 tablets (1,000 mg total) by mouth every 6 (six) hours. 06/01/2023: Bid    ARIPiprazole  (ABILIFY ) 5 MG tablet TAKE 1 TABLET(5 MG) BY MOUTH DAILY    buPROPion  (WELLBUTRIN  XL) 300 MG 24 hr tablet TAKE 1 TABLET(300 MG) BY MOUTH DAILY    busPIRone  (BUSPAR ) 10 MG tablet TAKE 1 TABLET(10 MG) BY MOUTH TWICE DAILY    docusate sodium (COLACE) 100 MG capsule Take 100 mg by mouth daily.    Ferrous Sulfate (IRON  PO) Take 325 mg by mouth daily.    hydrOXYzine  (VISTARIL ) 25 MG capsule Take 1 capsule (25 mg total) by mouth every 8 (eight) hours as needed for anxiety.    Multiple Vitamin (MULTIVITAMIN) tablet Take 1 tablet by mouth daily. 03/24/2023: On hold per patient until patient purchases more   omeprazole  (PRILOSEC) 40 MG capsule TAKE 1 CAPSULE(40 MG) BY MOUTH DAILY    ondansetron  (ZOFRAN ) 4 MG tablet Take 1 tablet (4 mg total) by mouth every 6 (six) hours as needed for nausea or vomiting.    Phentermine -Topiramate  (QSYMIA ) 3.75-23 MG CP24 Take one cap po qam x 2 weeks    Phentermine -Topiramate  (QSYMIA ) 7.5-46 MG CP24 Take one cap po qam    VITAMIN D  PO Take 2,000 Units by mouth daily.    silver sulfADIAZINE (SILVADENE) 1 % cream Apply topically.    No facility-administered encounter medications on file as of 10/25/2023.    ALLERGIES:  Allergies  Allergen Reactions   Demerol [Meperidine Hcl] Anaphylaxis   Aspirin Other (See Comments)    Upset stomach   Nsaids     Pt  reports has had gi issues lately and was told not to have steroid or NSAIDS prescribed at this time.    LABORATORY DATA:  I have reviewed the labs as listed.  CBC    Component Value Date/Time   WBC 8.0 10/25/2023 0931   RBC 4.56 10/25/2023 0931   HGB 14.4 10/25/2023 0931   HGB 9.4 (L) 01/08/2023 1557   HCT 43.7 10/25/2023 0931   HCT 30.5 (L) 01/08/2023 1557   PLT 271 10/25/2023 0931   PLT 327 01/08/2023 1557   MCV 95.8 10/25/2023 0931   MCV 79 01/08/2023 1557   MCH 31.6 10/25/2023 0931   MCHC 33.0 10/25/2023 0931   RDW 13.2 10/25/2023 0931   RDW 13.6 01/08/2023 1557   LYMPHSABS 2.7 10/25/2023 0931   LYMPHSABS 1.9 01/08/2023 1557   MONOABS 0.6 10/25/2023 0931   EOSABS 0.2 10/25/2023 0931   EOSABS 0.1 01/08/2023 1557   BASOSABS 0.1 10/25/2023 0931   BASOSABS 0.1 01/08/2023 1557      Latest Ref Rng & Units 02/23/2023    9:01 AM 01/08/2023    3:57 PM 04/13/2019   11:32 PM  CMP  Glucose 70 - 99 mg/dL  91  86   BUN 6 - 24 mg/dL  9  16   Creatinine 4.09 - 1.00 mg/dL  8.11  9.14   Sodium 782 - 144 mmol/L  139  140   Potassium 3.5 - 5.2 mmol/L  4.3  3.9   Chloride 96 - 106 mmol/L  104  109   CO2 20 - 29 mmol/L  23  23   Calcium 8.7 - 10.2 mg/dL  9.0  8.4   Total Protein 6.0 - 8.5 g/dL 6.1  6.4  6.3   Total Bilirubin 0.0 - 1.2 mg/dL <9.5  0.3  0.4   Alkaline Phos 44 - 121 IU/L 94  85  69   AST 0 - 40 IU/L 12  19  13    ALT 0 - 32 IU/L 23  32  22     DIAGNOSTIC IMAGING:  I have independently reviewed the relevant imaging and discussed with the patient.   WRAP UP:  All questions were answered. The patient knows to call the clinic with any problems, questions or concerns.  Medical decision making: Low  Time spent on visit: I spent 15 minutes counseling the patient face to face. The total time spent in the appointment was 22 minutes and more than 50% was on counseling.  Sonnie Dusky, PA-C  10/25/23 11:10 AM

## 2023-10-25 ENCOUNTER — Inpatient Hospital Stay

## 2023-10-25 ENCOUNTER — Inpatient Hospital Stay: Attending: Hematology | Admitting: Physician Assistant

## 2023-10-25 DIAGNOSIS — D500 Iron deficiency anemia secondary to blood loss (chronic): Secondary | ICD-10-CM | POA: Diagnosis not present

## 2023-10-25 DIAGNOSIS — Z8041 Family history of malignant neoplasm of ovary: Secondary | ICD-10-CM | POA: Insufficient documentation

## 2023-10-25 DIAGNOSIS — N920 Excessive and frequent menstruation with regular cycle: Secondary | ICD-10-CM | POA: Insufficient documentation

## 2023-10-25 DIAGNOSIS — Z86718 Personal history of other venous thrombosis and embolism: Secondary | ICD-10-CM | POA: Diagnosis not present

## 2023-10-25 DIAGNOSIS — Z800 Family history of malignant neoplasm of digestive organs: Secondary | ICD-10-CM | POA: Insufficient documentation

## 2023-10-25 LAB — CBC WITH DIFFERENTIAL/PLATELET
Abs Immature Granulocytes: 0.04 10*3/uL (ref 0.00–0.07)
Basophils Absolute: 0.1 10*3/uL (ref 0.0–0.1)
Basophils Relative: 1 %
Eosinophils Absolute: 0.2 10*3/uL (ref 0.0–0.5)
Eosinophils Relative: 2 %
HCT: 43.7 % (ref 36.0–46.0)
Hemoglobin: 14.4 g/dL (ref 12.0–15.0)
Immature Granulocytes: 1 %
Lymphocytes Relative: 34 %
Lymphs Abs: 2.7 10*3/uL (ref 0.7–4.0)
MCH: 31.6 pg (ref 26.0–34.0)
MCHC: 33 g/dL (ref 30.0–36.0)
MCV: 95.8 fL (ref 80.0–100.0)
Monocytes Absolute: 0.6 10*3/uL (ref 0.1–1.0)
Monocytes Relative: 7 %
Neutro Abs: 4.5 10*3/uL (ref 1.7–7.7)
Neutrophils Relative %: 55 %
Platelets: 271 10*3/uL (ref 150–400)
RBC: 4.56 MIL/uL (ref 3.87–5.11)
RDW: 13.2 % (ref 11.5–15.5)
WBC: 8 10*3/uL (ref 4.0–10.5)
nRBC: 0 % (ref 0.0–0.2)

## 2023-10-25 LAB — IRON AND TIBC
Iron: 73 ug/dL (ref 28–170)
Saturation Ratios: 26 % (ref 10.4–31.8)
TIBC: 277 ug/dL (ref 250–450)
UIBC: 204 ug/dL

## 2023-10-25 LAB — FERRITIN: Ferritin: 123 ng/mL (ref 11–307)

## 2023-10-25 NOTE — Patient Instructions (Signed)
 [facility] at [facility] **VISIT SUMMARY & IMPORTANT INSTRUCTIONS **   You were seen today by Sheril Dines PA-C for your iron  deficiency anemia.   Your blood and iron  levels look great! You do not need any IV iron  at this time. Continue your daily iron  pill. Reach out sooner if you have any symptoms of recurrent iron  deficiency.  FOLLOW-UP APPOINTMENT: Same day labs and office visit in 6 months  ** Thank you for trusting me with your healthcare!  I strive to provide all of my patients with quality care at each visit.  If you receive a survey for this visit, I would be so grateful to you for taking the time to provide feedback.  Thank you in advance!  ~ Deasha Clendenin                   Dr. Paulett Boros   &   Sheril Dines, PA-C   - - - - - - - - - - - - - - - - - -    Thank you for choosing [facility] at [facility] to provide your oncology and hematology care.  To afford each patient quality time with our provider, please arrive at least 15 minutes before your scheduled appointment time.   If you have a lab appointment with the Cancer Center please come in thru the Main Entrance and check in at the main information desk.  You need to re-schedule your appointment should you arrive 10 or more minutes late.  We strive to give you quality time with our providers, and arriving late affects you and other patients whose appointments are after yours.  Also, if you no show three or more times for appointments you may be dismissed from the clinic at the providers discretion.     Again, thank you for choosing [facility].  Our hope is that these requests will decrease the amount of time that you wait before being seen by our physicians.       _____________________________________________________________  Should you have questions after your visit to [facility], please contact our office at [phone] and follow  the prompts.  Our office hours are 8:00 a.m. and 4:30 p.m. Monday - Friday.  Please note that voicemails left after 4:00 p.m. may not be returned until the following business day.  We are closed weekends and major holidays.  You do have access to a nurse 24-7, just call the main number to the clinic [phone] and do not press any options, hold on the line and a nurse will answer the phone.    For prescription refill requests, have your pharmacy contact our office and allow 72 hours.

## 2023-11-16 ENCOUNTER — Encounter: Payer: Self-pay | Admitting: Nurse Practitioner

## 2023-11-16 ENCOUNTER — Other Ambulatory Visit: Payer: Self-pay | Admitting: Nurse Practitioner

## 2023-11-16 MED ORDER — PHENTERMINE-TOPIRAMATE ER 15-92 MG PO CP24: ORAL_CAPSULE | ORAL | 0 refills | Status: DC

## 2023-12-12 ENCOUNTER — Other Ambulatory Visit: Payer: Self-pay | Admitting: Family Medicine

## 2023-12-12 DIAGNOSIS — F419 Anxiety disorder, unspecified: Secondary | ICD-10-CM

## 2023-12-13 ENCOUNTER — Other Ambulatory Visit: Payer: Self-pay | Admitting: Family Medicine

## 2023-12-13 DIAGNOSIS — F419 Anxiety disorder, unspecified: Secondary | ICD-10-CM

## 2023-12-14 ENCOUNTER — Encounter: Payer: Self-pay | Admitting: Hematology

## 2023-12-27 ENCOUNTER — Ambulatory Visit: Admitting: Nurse Practitioner

## 2023-12-27 ENCOUNTER — Encounter: Payer: Self-pay | Admitting: Nurse Practitioner

## 2023-12-27 VITALS — BP 123/75 | HR 65 | Temp 97.3°F | Ht 60.0 in | Wt 184.0 lb

## 2023-12-27 DIAGNOSIS — Z13220 Encounter for screening for lipoid disorders: Secondary | ICD-10-CM | POA: Diagnosis not present

## 2023-12-27 DIAGNOSIS — K219 Gastro-esophageal reflux disease without esophagitis: Secondary | ICD-10-CM | POA: Diagnosis not present

## 2023-12-27 DIAGNOSIS — R5383 Other fatigue: Secondary | ICD-10-CM

## 2023-12-27 DIAGNOSIS — E559 Vitamin D deficiency, unspecified: Secondary | ICD-10-CM | POA: Diagnosis not present

## 2023-12-27 DIAGNOSIS — F32A Depression, unspecified: Secondary | ICD-10-CM

## 2023-12-27 DIAGNOSIS — Z0000 Encounter for general adult medical examination without abnormal findings: Secondary | ICD-10-CM | POA: Diagnosis not present

## 2023-12-27 DIAGNOSIS — F419 Anxiety disorder, unspecified: Secondary | ICD-10-CM | POA: Diagnosis not present

## 2023-12-27 MED ORDER — HYDROXYZINE PAMOATE 25 MG PO CAPS: 25.0000 mg | ORAL_CAPSULE | Freq: Three times a day (TID) | ORAL | 2 refills | Status: AC | PRN

## 2023-12-27 NOTE — Progress Notes (Signed)
 Subjective:    Patient ID: Patricia Becker, female    DOB: 20-May-1975, 48 y.o.   MRN: [id number]  HPI Discussed the use of AI scribe software for clinical note transcription with the patient, who gave verbal consent to proceed.  History of Present Illness Patricia Becker is a 48 year old female who presents for an annual physical exam required for work. Gets PAP smears and mammograms through GYN.  She maintains her weight at 184 pounds and is actively engaged in meal prepping and exercise with her husband's support. She works three 12-hour night shifts per week, which involves a lot of physical activity. Her sleep is sporadic, typically around six hours, but she adjusts her sleep pattern on her days off.  She has a history of low iron  levels, managed by the cancer center, with recent lab work showing improvement. She also has a vitamin D  deficiency for which she takes daily supplements. She has a history of polyps found during a colonoscopy, which is up to date with the next one due in 2029.  She experiences frequent urination and reports drinking a lot of water . She has delivered three children. No burning or pain with urination. No significant respiratory symptoms such as shortness of breath, cough, or wheezing. No gastrointestinal issues like diarrhea or constipation, though stools are dark due to iron  supplementation. GERD stable on Omeprazole .   Her current medications include Abilify , bupropion , and buspirone , which are effective. She has stopped taking phentermine  and Zofran  as they are no longer needed. She requests a refill for hydroxyzine  as her current supply is expired.     Review of Systems  Constitutional:  Positive for fatigue. Negative for activity change and appetite change.  HENT:  Negative for sore throat and trouble swallowing.   Respiratory:  Negative for cough, chest tightness, shortness of breath and wheezing.   Cardiovascular:  Negative for chest pain.   Gastrointestinal:  Negative for abdominal distention, abdominal pain, constipation, diarrhea, nausea and vomiting.  Genitourinary:  Positive for frequency. Negative for dysuria and urgency.  Psychiatric/Behavioral:  Negative for sleep disturbance and suicidal ideas. The patient is nervous/anxious.       12/27/2023    8:19 AM  Depression screen PHQ 2/9  Decreased Interest 0  Down, Depressed, Hopeless 0  PHQ - 2 Score 0  Altered sleeping 0  Tired, decreased energy 0  Change in appetite 0  Feeling bad or failure about yourself  0  Trouble concentrating 0  Moving slowly or fidgety/restless 0  Suicidal thoughts 0  PHQ-9 Score 0  Difficult doing work/chores Not difficult at all      12/27/2023    8:19 AM 09/28/2023    8:32 AM 06/28/2023    3:40 PM 02/19/2023    3:06 PM  GAD 7 : Generalized Anxiety Score  Nervous, Anxious, on Edge 0 0 0 1  Control/stop worrying 0 0 0 1  Worry too much - different things 0 0 0 1  Trouble relaxing 0 0 0 0  Restless 0 0 0 0  Easily annoyed or irritable 0 0 0 1  Afraid - awful might happen 0 0 0 1  Total GAD 7 Score 0 0 0 5  Anxiety Difficulty Not difficult at all Not difficult at all Not difficult at all Somewhat difficult   Social History   Tobacco Use   Smoking status: Never    Passive exposure: Yes   Smokeless tobacco: Never  Vaping Use   Vaping status: Never  Used  Substance Use Topics   Alcohol use: Yes    Alcohol/week: 2.0 standard drinks of alcohol    Types: 2 Glasses of wine per week    Comment: very rarely   Drug use: No         Objective:   Physical Exam Vitals and nursing note reviewed.  Constitutional:      General: She is not in acute distress.    Appearance: She is well-developed.  Neck:     Thyroid : No thyromegaly.     Trachea: No tracheal deviation.     Comments: Thyroid  non tender to palpation. No mass or goiter noted.  Cardiovascular:     Rate and Rhythm: Normal rate and regular rhythm.     Heart sounds:  Normal heart sounds. No murmur heard. Pulmonary:     Effort: Pulmonary effort is normal.     Breath sounds: Normal breath sounds.  Abdominal:     General: There is no distension.     Palpations: Abdomen is soft.     Tenderness: There is no abdominal tenderness.  Musculoskeletal:     Cervical back: Normal range of motion and neck supple.     Right lower leg: No edema.     Left lower leg: No edema.  Lymphadenopathy:     Cervical: No cervical adenopathy.  Skin:    General: Skin is warm and dry.  Neurological:     Mental Status: She is alert and oriented to person, place, and time.  Psychiatric:        Mood and Affect: Mood normal.        Behavior: Behavior normal.        Thought Content: Thought content normal.        Judgment: Judgment normal.     Comments: Making good eye contact. Speech clear. Calm, cheerful affect.     Today's Vitals   12/27/23 0812  BP: 123/75  Pulse: 65  Temp: (!) 97.3 F (36.3 C)  SpO2: 100%  Weight: 184 lb (83.5 kg)  Height: 5' (1.524 m)   Body mass index is 35.94 kg/m.        Assessment & Plan:   Assessment & Plan Problem List Items Addressed This Visit       Digestive   Gastroesophageal reflux disease without esophagitis     Other   Anxiety and depression   Relevant Medications   hydrOXYzine  (VISTARIL ) 25 MG capsule   Vitamin D  deficiency   Relevant Orders   VITAMIN D  25 Hydroxy (Vit-D Deficiency, Fractures)   Other Visit Diagnoses       Annual physical exam    -  Primary     Fatigue, unspecified type       Relevant Orders   Comprehensive metabolic panel with GFR   TSH     Screening for lipid disorders       Relevant Orders   Lipid panel      Meds ordered this encounter  Medications   hydrOXYzine  (VISTARIL ) 25 MG capsule    Sig: Take 1 capsule (25 mg total) by mouth every 8 (eight) hours as needed for anxiety.    Dispense:  90 capsule    Refill:  2    Weight stable at 184 lbs with meal prepping and exercise. No  recent weight gain. - Continue meal prepping and exercise regimen. - Encourage gradual lifestyle changes and self-compassion.  Anxiety and Depression Anxiety and depression controlled with current medications. PHQ and GAD-7 scores  are zero. Hydroxyzine  needed.  - Prescribe hydroxyzine . - Continue current medications: Abilify , bupropion , and buspirone .   Vitamin D  deficiency Managed with daily supplementation. Levels adequate 11 months ago. - Order vitamin D  level   GERD Continue Omeprazole  as directed.   Return in about 6 months (around 06/28/2024).

## 2023-12-28 ENCOUNTER — Ambulatory Visit: Admitting: Nurse Practitioner

## 2023-12-30 DIAGNOSIS — Z13220 Encounter for screening for lipoid disorders: Secondary | ICD-10-CM | POA: Diagnosis not present

## 2023-12-30 DIAGNOSIS — E559 Vitamin D deficiency, unspecified: Secondary | ICD-10-CM | POA: Diagnosis not present

## 2023-12-30 DIAGNOSIS — R5383 Other fatigue: Secondary | ICD-10-CM | POA: Diagnosis not present

## 2023-12-30 IMAGING — DX DG ANKLE COMPLETE 3+V*R*
3 series · 3 of 3 positions shown · non-contrast
Comparison: None.

CLINICAL DATA: Right ankle pain.

EXAM:
RIGHT ANKLE - COMPLETE 3+ VIEW

[ankle ap]
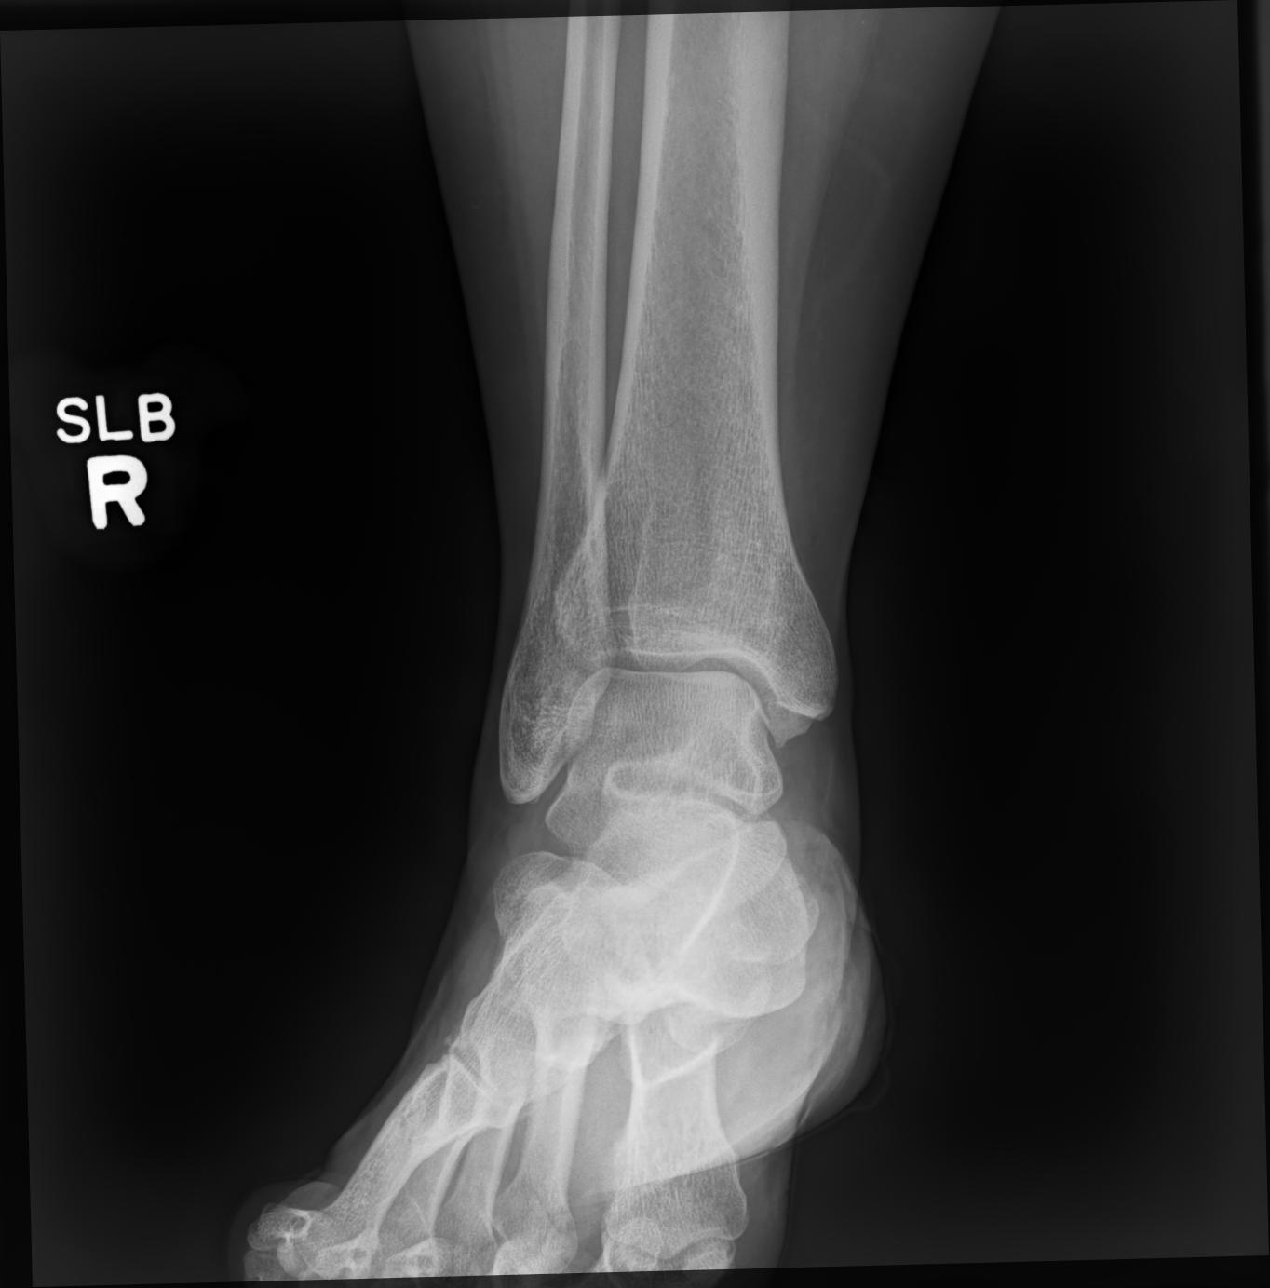

[ankle mlo]
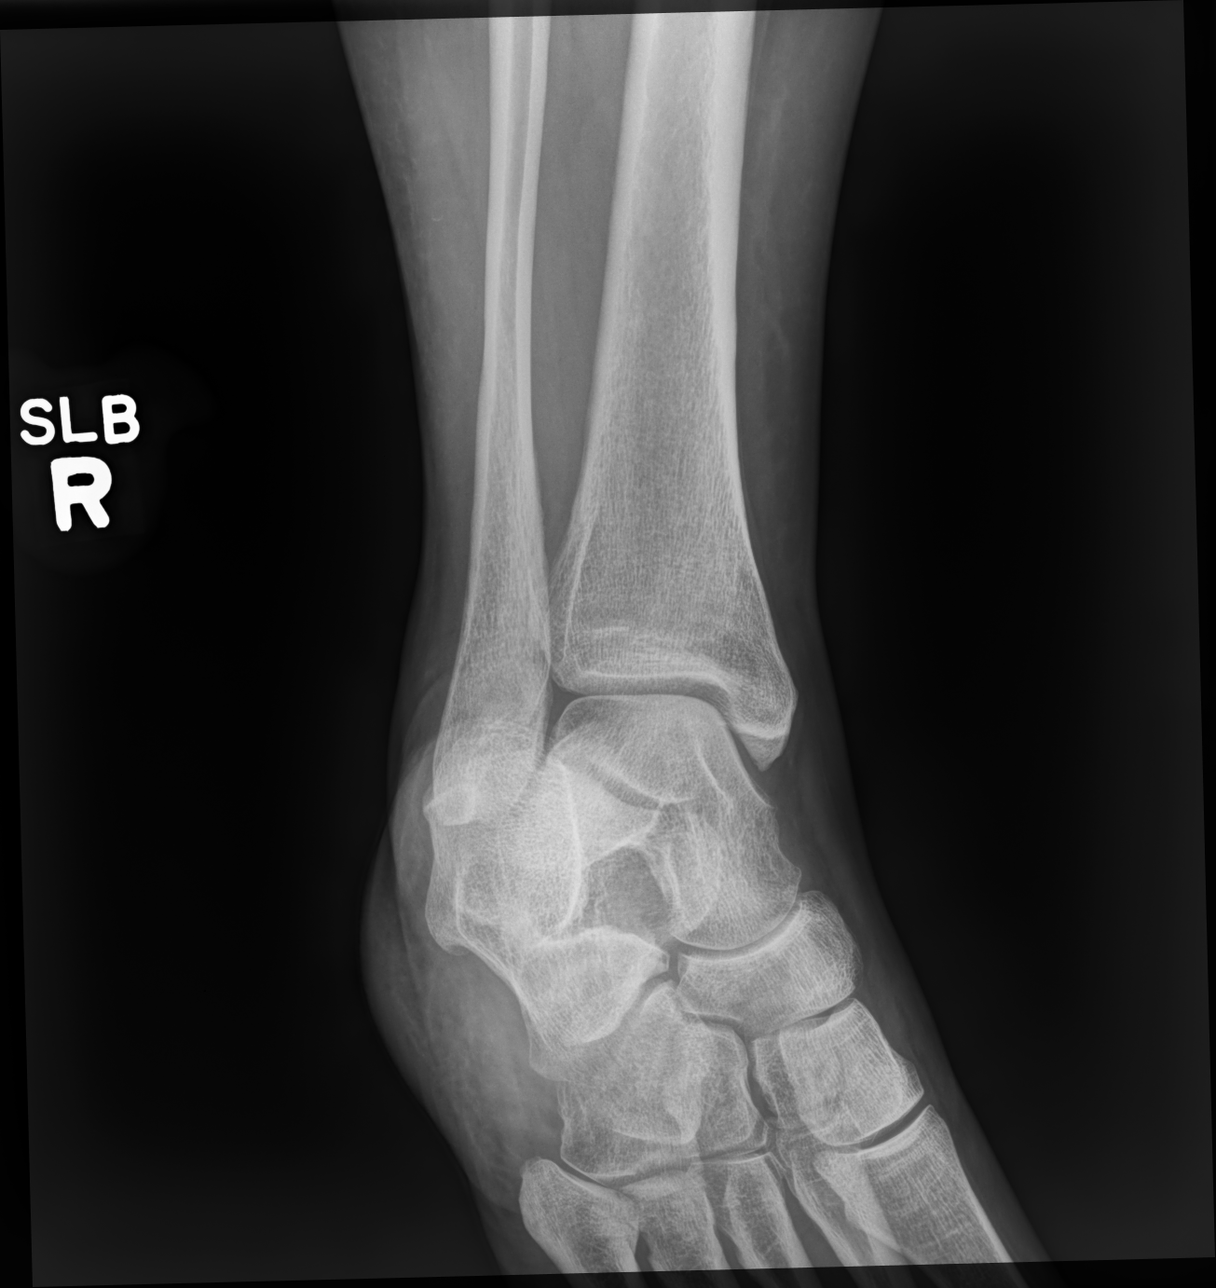

[ankle lat]
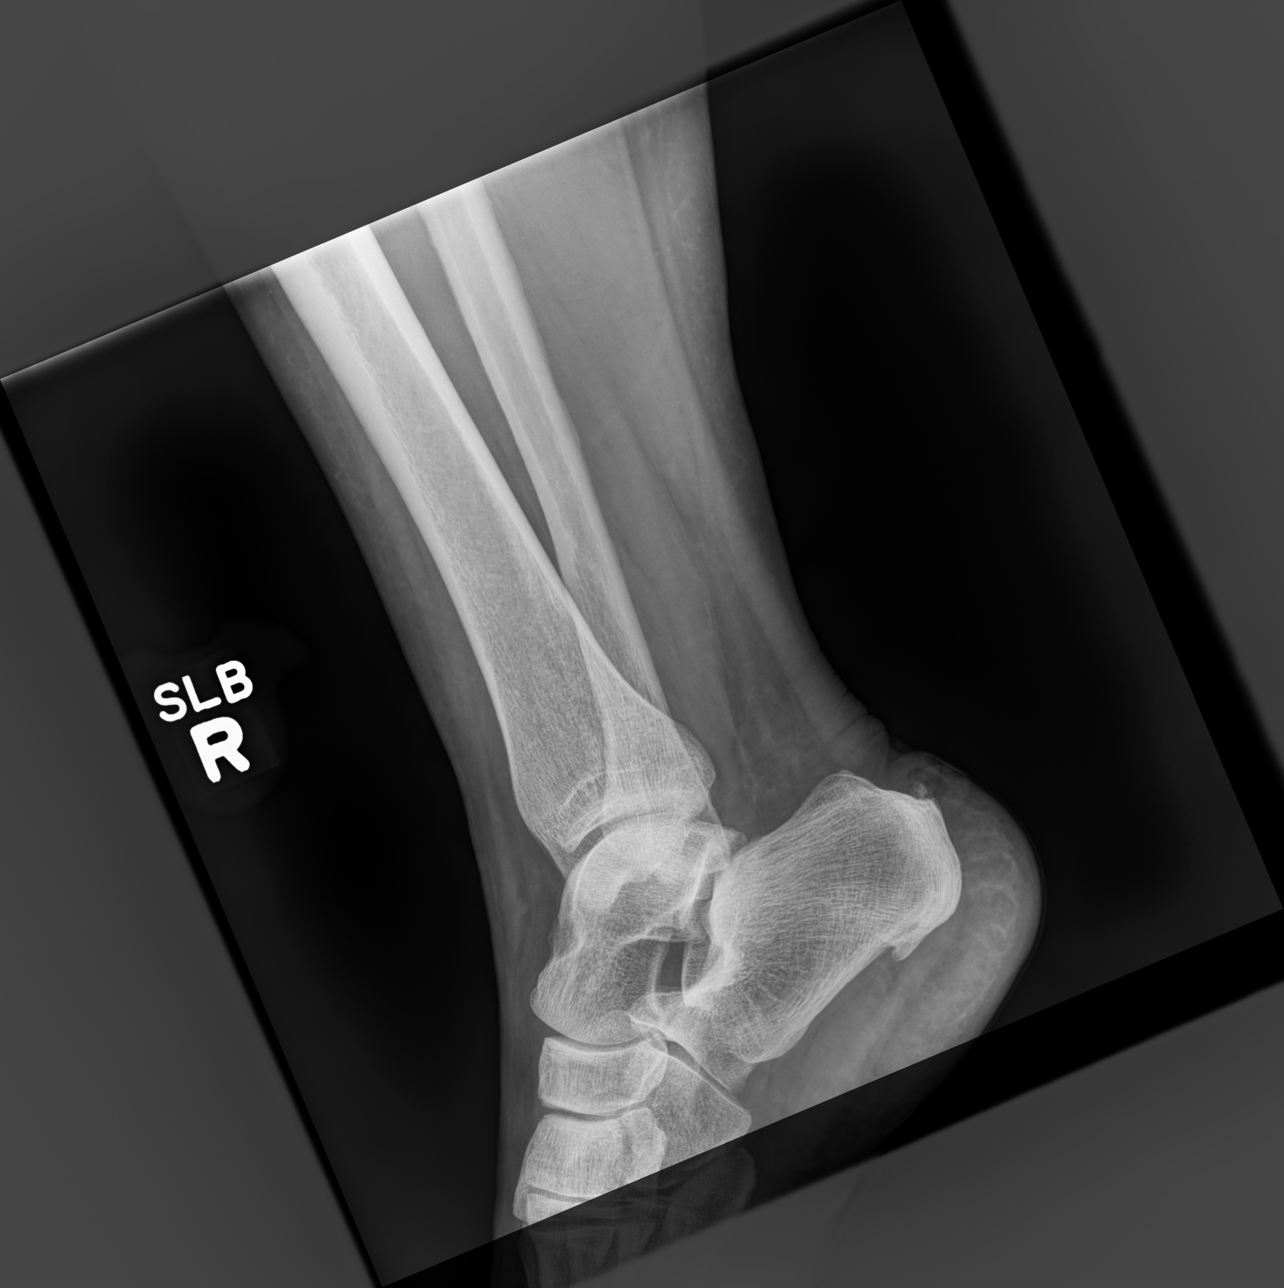

[3 of 3 positions shown; findings below may reference images not displayed]

FINDINGS: There is no evidence of fracture, dislocation, or joint effusion.
There is no evidence of arthropathy or other focal bone abnormality.
Mild Achilles enthesopathy.
IMPRESSION: No evidence of fracture or dislocation.  No significant arthropathy.

## 2023-12-31 ENCOUNTER — Encounter: Payer: Self-pay | Admitting: Radiology

## 2023-12-31 LAB — COMPREHENSIVE METABOLIC PANEL WITH GFR
ALT: 54 IU/L — ABNORMAL HIGH (ref 0–32)
AST: 23 IU/L (ref 0–40)
Albumin: 4.1 g/dL (ref 3.9–4.9)
Alkaline Phosphatase: 79 IU/L (ref 44–121)
BUN/Creatinine Ratio: 17 (ref 9–23)
BUN: 16 mg/dL (ref 6–24)
Bilirubin Total: 0.4 mg/dL (ref 0.0–1.2)
CO2: 24 mmol/L (ref 20–29)
Calcium: 9.3 mg/dL (ref 8.7–10.2)
Chloride: 107 mmol/L — ABNORMAL HIGH (ref 96–106)
Creatinine, Ser: 0.92 mg/dL (ref 0.57–1.00)
Globulin, Total: 2 g/dL (ref 1.5–4.5)
Glucose: 81 mg/dL (ref 70–99)
Potassium: 4 mmol/L (ref 3.5–5.2)
Sodium: 144 mmol/L (ref 134–144)
Total Protein: 6.1 g/dL (ref 6.0–8.5)
eGFR: 77 mL/min/1.73 (ref >59)

## 2023-12-31 LAB — VITAMIN D 25 HYDROXY (VIT D DEFICIENCY, FRACTURES): Vit D, 25-Hydroxy: 43.3 ng/mL (ref 30.0–100.0)

## 2023-12-31 LAB — LIPID PANEL
Chol/HDL Ratio: 2.3 ratio (ref 0.0–4.4)
Cholesterol, Total: 134 mg/dL (ref 100–199)
HDL: 59 mg/dL (ref >39)
LDL Chol Calc (NIH): 60 mg/dL (ref 0–99)
Triglycerides: 76 mg/dL (ref 0–149)
VLDL Cholesterol Cal: 15 mg/dL (ref 5–40)

## 2023-12-31 LAB — TSH: TSH: 2.5 u[IU]/mL (ref 0.450–4.500)

## 2024-01-03 ENCOUNTER — Other Ambulatory Visit: Payer: Self-pay | Admitting: Nurse Practitioner

## 2024-01-03 ENCOUNTER — Ambulatory Visit: Payer: Self-pay | Admitting: Nurse Practitioner

## 2024-01-03 DIAGNOSIS — R7401 Elevation of levels of liver transaminase levels: Secondary | ICD-10-CM

## 2024-01-19 ENCOUNTER — Other Ambulatory Visit (HOSPITAL_COMMUNITY): Payer: Self-pay

## 2024-01-19 ENCOUNTER — Encounter: Payer: Self-pay | Admitting: Oncology

## 2024-01-20 ENCOUNTER — Encounter: Payer: Self-pay | Admitting: Oncology

## 2024-01-20 ENCOUNTER — Other Ambulatory Visit (HOSPITAL_COMMUNITY): Payer: Self-pay

## 2024-01-20 MED ORDER — ONDANSETRON HCL 4 MG PO TABS: 4.0000 mg | ORAL_TABLET | Freq: Four times a day (QID) | ORAL | 0 refills | Status: DC | PRN

## 2024-01-20 MED ORDER — MOXIFLOXACIN HCL 0.5 % OP SOLN: 1.0000 [drp] | Freq: Three times a day (TID) | OPHTHALMIC | 0 refills | Status: DC

## 2024-01-20 MED ORDER — CEPHALEXIN 500 MG PO CAPS: 500.0000 mg | ORAL_CAPSULE | Freq: Three times a day (TID) | ORAL | 1 refills | Status: DC

## 2024-01-20 MED ORDER — OMEPRAZOLE 40 MG PO CPDR: 40.0000 mg | DELAYED_RELEASE_CAPSULE | Freq: Every day | ORAL | 3 refills | Status: DC

## 2024-01-20 MED FILL — Aripiprazole Tab 5 MG: ORAL | 30 days supply | Qty: 30 | Fill #0 | Status: AC

## 2024-01-20 MED FILL — Omeprazole Cap Delayed Release 40 MG: ORAL | 90 days supply | Qty: 90 | Fill #0 | Status: AC

## 2024-01-20 MED FILL — Bupropion HCl Tab ER 24HR 300 MG: ORAL | 30 days supply | Qty: 30 | Fill #0 | Status: AC

## 2024-01-21 ENCOUNTER — Other Ambulatory Visit: Payer: Self-pay

## 2024-01-21 ENCOUNTER — Other Ambulatory Visit (HOSPITAL_COMMUNITY): Payer: Self-pay

## 2024-02-07 ENCOUNTER — Other Ambulatory Visit (HOSPITAL_COMMUNITY): Payer: Self-pay

## 2024-02-07 ENCOUNTER — Other Ambulatory Visit (HOSPITAL_BASED_OUTPATIENT_CLINIC_OR_DEPARTMENT_OTHER): Payer: Self-pay

## 2024-02-14 ENCOUNTER — Other Ambulatory Visit (HOSPITAL_BASED_OUTPATIENT_CLINIC_OR_DEPARTMENT_OTHER): Payer: Self-pay

## 2024-02-14 ENCOUNTER — Other Ambulatory Visit: Payer: Self-pay | Admitting: Family Medicine

## 2024-02-14 DIAGNOSIS — F32A Depression, unspecified: Secondary | ICD-10-CM

## 2024-02-16 ENCOUNTER — Other Ambulatory Visit (HOSPITAL_BASED_OUTPATIENT_CLINIC_OR_DEPARTMENT_OTHER): Payer: Self-pay

## 2024-02-16 MED ORDER — BUPROPION HCL ER (XL) 300 MG PO TB24
300.0000 mg | ORAL_TABLET | Freq: Every day | ORAL | 1 refills | Status: AC
Start: 2024-02-16 — End: ?
  Filled 2024-02-16: qty 90, 90d supply, fill #0
  Filled 2024-04-26 – 2024-04-28 (×2): qty 90, 90d supply, fill #1

## 2024-02-16 MED ORDER — ARIPIPRAZOLE 5 MG PO TABS
5.0000 mg | ORAL_TABLET | Freq: Every day | ORAL | 1 refills | Status: DC
  Filled 2024-02-16: qty 90, 90d supply, fill #0

## 2024-02-21 ENCOUNTER — Encounter: Payer: Self-pay | Admitting: Adult Health

## 2024-02-21 ENCOUNTER — Ambulatory Visit: Admitting: Adult Health

## 2024-02-21 VITALS — BP 109/63 | HR 77 | Ht 60.0 in | Wt 187.5 lb

## 2024-02-21 DIAGNOSIS — Z01419 Encounter for gynecological examination (general) (routine) without abnormal findings: Secondary | ICD-10-CM | POA: Insufficient documentation

## 2024-02-21 DIAGNOSIS — N926 Irregular menstruation, unspecified: Secondary | ICD-10-CM | POA: Diagnosis not present

## 2024-02-21 DIAGNOSIS — Z1331 Encounter for screening for depression: Secondary | ICD-10-CM

## 2024-02-21 DIAGNOSIS — N951 Menopausal and female climacteric states: Secondary | ICD-10-CM | POA: Diagnosis not present

## 2024-02-21 NOTE — Progress Notes (Signed)
 Patient ID: Patricia Becker, female   DOB: June 23, 1975, 48 y.o.   MRN: [id number] History of Present Illness: Patricia Becker is a 48 year old white female, married, G3P3003 in for a well woman gyn exam. She has started skipping periods, skipped 3 months then had one.     Component Value Date/Time   DIAGPAP (A) 05/19/2022 1154    - Atypical squamous cells of undetermined significance (ASC-US )   DIAGPAP  03/09/2019 1358    - Negative for intraepithelial lesion or malignancy (NILM)   HPVHIGH Negative 05/19/2022 1154   HPVHIGH Negative 03/09/2019 1358   ADEQPAP  05/19/2022 1154    Satisfactory for evaluation; transformation zone component PRESENT.   ADEQPAP  03/09/2019 1358    Satisfactory for evaluation; transformation zone component PRESENT.    PCP is Dr Bluford  Current Medications, Allergies, Past Medical History, Past Surgical History, Family History and Social History were reviewed in [employer] electronic medical record.     Review of Systems:  Patient denies any headaches, hearing loss, fatigue, blurred vision, shortness of breath, chest pain, abdominal pain, problems with bowel movements, urination, or intercourse. No joint pain or mood swings.  +skipping periods No real hot flashes   Physical Exam:BP 109/63 (BP Location: Left Arm, Patient Position: Sitting, Cuff Size: Normal)   Pulse 77   Ht 5' (1.524 m)   Wt 187 lb 8 oz (85 kg)   LMP 01/28/2024 (Exact Date)   BMI 36.62 kg/m   General:  Well developed, well nourished, no acute distress Skin:  Warm and dry Neck:  Midline trachea, normal thyroid , good ROM, no lymphadenopathy Lungs; Clear to auscultation bilaterally Breast:  No dominant palpable mass, retraction, or nipple discharge Cardiovascular: Regular rate and rhythm Abdomen:  Soft, non tender, no hepatosplenomegaly Pelvic:  External genitalia is normal in appearance, no lesions.  The vagina is normal in appearance. Urethra has no lesions or masses. The cervix is bulbous.   Uterus is felt to be normal size, shape, and contour.  No adnexal masses or tenderness noted.Bladder is non tender, no masses felt. Rectal:Deferred  Extremities/musculoskeletal:  No swelling or varicosities noted, no clubbing or cyanosis Psych:  No mood changes, alert and cooperative,seems happy AA is 1 Fall risk is low    02/21/2024   11:47 AM 12/27/2023    8:19 AM 09/28/2023    8:32 AM  Depression screen PHQ 2/9  Decreased Interest 0 0 0  Down, Depressed, Hopeless 0 0 0  PHQ - 2 Score 0 0 0  Altered sleeping 0 0 1  Tired, decreased energy 0 0 1  Change in appetite 2 0 0  Feeling bad or failure about yourself  0 0 0  Trouble concentrating 0 0 0  Moving slowly or fidgety/restless 0 0 0  Suicidal thoughts 0 0 0  PHQ-9 Score 2 0 2  Difficult doing work/chores  Not difficult at all Not difficult at all       02/21/2024   11:47 AM 12/27/2023    8:19 AM 09/28/2023    8:32 AM 06/28/2023    3:40 PM  GAD 7 : Generalized Anxiety Score  Nervous, Anxious, on Edge 1 0 0 0  Control/stop worrying 0 0 0 0  Worry too much - different things 0 0 0 0  Trouble relaxing 0 0 0 0  Restless 0 0 0 0  Easily annoyed or irritable 1 0 0 0  Afraid - awful might happen 0 0 0 0  Total GAD 7  Score 2 0 0 0  Anxiety Difficulty  Not difficult at all Not difficult at all Not difficult at all    Upstream - 02/21/24 1144       Pregnancy Intention Screening   Does the patient want to become pregnant in the next year? No    Does the patient's partner want to become pregnant in the next year? No    Would the patient like to discuss contraceptive options today? No      Contraception Wrap Up   Current Method Female Sterilization    End Method Female Sterilization    Contraception Counseling Provided No           Examination chaperoned by Clarita Salt LPN  Impression and plan: 1. Encounter for well woman exam with routine gynecological exam (Primary) Pap in 2027 Physical in 1 year Mammogram was  negative 06/03/22, she will call for appt Colonoscopy was 2024, to repeat in 5 years Labs with PCP 2. Irregular periods Skipping periods Discussed perimenopause   3. Perimenopause Review handout

## 2024-02-22 ENCOUNTER — Telehealth: Payer: Self-pay | Admitting: *Deleted

## 2024-02-22 ENCOUNTER — Other Ambulatory Visit: Payer: Self-pay | Admitting: *Deleted

## 2024-02-22 DIAGNOSIS — D500 Iron deficiency anemia secondary to blood loss (chronic): Secondary | ICD-10-CM

## 2024-02-22 NOTE — Telephone Encounter (Signed)
 Patient called with c/o fatigue and feeling as if iron  is low.  Will bring in tomorrow for labs and consult with Pleasant Barefoot, [facility] regarding results and recommendations.

## 2024-02-23 ENCOUNTER — Ambulatory Visit: Payer: Self-pay | Admitting: *Deleted

## 2024-02-23 ENCOUNTER — Inpatient Hospital Stay: Attending: Physician Assistant

## 2024-02-23 ENCOUNTER — Encounter: Payer: Self-pay | Admitting: *Deleted

## 2024-02-23 DIAGNOSIS — D509 Iron deficiency anemia, unspecified: Secondary | ICD-10-CM | POA: Insufficient documentation

## 2024-02-23 DIAGNOSIS — D500 Iron deficiency anemia secondary to blood loss (chronic): Secondary | ICD-10-CM

## 2024-02-23 LAB — CBC WITH DIFFERENTIAL/PLATELET
Abs Immature Granulocytes: 0.04 K/uL (ref 0.00–0.07)
Basophils Absolute: 0.1 K/uL (ref 0.0–0.1)
Basophils Relative: 1 %
Eosinophils Absolute: 0.2 K/uL (ref 0.0–0.5)
Eosinophils Relative: 2 %
HCT: 44.3 % (ref 36.0–46.0)
Hemoglobin: 14.3 g/dL (ref 12.0–15.0)
Immature Granulocytes: 0 %
Lymphocytes Relative: 30 %
Lymphs Abs: 2.9 K/uL (ref 0.7–4.0)
MCH: 31 pg (ref 26.0–34.0)
MCHC: 32.3 g/dL (ref 30.0–36.0)
MCV: 96.1 fL (ref 80.0–100.0)
Monocytes Absolute: 0.7 K/uL (ref 0.1–1.0)
Monocytes Relative: 7 %
Neutro Abs: 6 K/uL (ref 1.7–7.7)
Neutrophils Relative %: 60 %
Platelets: 247 K/uL (ref 150–400)
RBC: 4.61 MIL/uL (ref 3.87–5.11)
RDW: 13 % (ref 11.5–15.5)
WBC: 9.9 K/uL (ref 4.0–10.5)
nRBC: 0 % (ref 0.0–0.2)

## 2024-02-23 LAB — FERRITIN: Ferritin: 179 ng/mL (ref 11–307)

## 2024-02-23 LAB — IRON AND TIBC
Iron: 96 ug/dL (ref 28–170)
Saturation Ratios: 34 % — ABNORMAL HIGH (ref 10.4–31.8)
TIBC: 280 ug/dL (ref 250–450)
UIBC: 184 ug/dL

## 2024-02-23 NOTE — Progress Notes (Signed)
 Message left for patient to confirm receipt of recommendations.

## 2024-03-13 ENCOUNTER — Encounter: Payer: Self-pay | Admitting: Radiology

## 2024-03-23 ENCOUNTER — Encounter: Payer: Self-pay | Admitting: Nurse Practitioner

## 2024-03-30 ENCOUNTER — Ambulatory Visit: Admitting: Nurse Practitioner

## 2024-03-30 VITALS — BP 119/74 | HR 71 | Ht 60.0 in | Wt 195.1 lb

## 2024-03-30 DIAGNOSIS — F419 Anxiety disorder, unspecified: Secondary | ICD-10-CM | POA: Diagnosis not present

## 2024-03-30 DIAGNOSIS — R7401 Elevation of levels of liver transaminase levels: Secondary | ICD-10-CM

## 2024-03-30 DIAGNOSIS — F32A Depression, unspecified: Secondary | ICD-10-CM

## 2024-03-30 DIAGNOSIS — F3161 Bipolar disorder, current episode mixed, mild: Secondary | ICD-10-CM

## 2024-03-30 DIAGNOSIS — F338 Other recurrent depressive disorders: Secondary | ICD-10-CM

## 2024-03-30 NOTE — Patient Instructions (Signed)
 Increase Abilify  to 10 mg once a day (take 2 of the 5 mg tabs)

## 2024-03-31 ENCOUNTER — Encounter: Payer: Self-pay | Admitting: Nurse Practitioner

## 2024-03-31 DIAGNOSIS — F338 Other recurrent depressive disorders: Secondary | ICD-10-CM | POA: Insufficient documentation

## 2024-03-31 DIAGNOSIS — F3161 Bipolar disorder, current episode mixed, mild: Secondary | ICD-10-CM | POA: Insufficient documentation

## 2024-03-31 DIAGNOSIS — R7401 Elevation of levels of liver transaminase levels: Secondary | ICD-10-CM | POA: Insufficient documentation

## 2024-03-31 NOTE — Progress Notes (Signed)
 Subjective:    Patient ID: Patricia Becker, female    DOB: 1976/03/12, 48 y.o.   MRN: [id number]   Discussed the use of AI scribe software for clinical note transcription with the patient, who gave verbal consent to proceed.  History of Present Illness Patricia Becker is a 48 year old female who presents with anxiety and irritability.  She has experienced increased anxiety and irritability over the past month without specific triggers such as a death, relationship issues, family issues, or job stress. She reports that she probably has issues with season change, and her symptoms began around Halloween. No thoughts of self-harm or harm to others are present.  She is currently on a low dose of Abilify , which has significantly improved her symptoms. She also takes Wellbutrin  and Buspar . She has a history of trying SSRIs like Zoloft, Paxil, and Lexapro without success. No side effects from Abilify  are reported, although she mentions a history of weight fluctuations.  Her sleep is sporadic due to working night shifts, affecting her circadian rhythm. She notes an increase in eating for emotional reasons.  A recent lab workup in August showed normal thyroid  levels but slightly elevated liver enzymes, possibly due to frequent Tylenol  use. Occasional social alcohol use but not daily. She has not been officially diagnosed with bipolar disorder but has been screened for mood disorders. No chest pain, shortness of breath, coughing, or wheezing, although she experiences occasional sharp, stabby sensations related to anxiety.      03/30/2024   11:32 AM  Depression screen PHQ 2/9  Decreased Interest 1  Down, Depressed, Hopeless 1  PHQ - 2 Score 2  Altered sleeping 2  Tired, decreased energy 2  Change in appetite 3  Feeling bad or failure about yourself  1  Trouble concentrating 0  Moving slowly or fidgety/restless 0  Suicidal thoughts 0  PHQ-9 Score 10  Difficult doing work/chores Somewhat  difficult      03/30/2024   11:32 AM 02/21/2024   11:47 AM 12/27/2023    8:19 AM 09/28/2023    8:32 AM  GAD 7 : Generalized Anxiety Score  Nervous, Anxious, on Edge 1 1 0 0  Control/stop worrying 1 0 0 0  Worry too much - different things 1 0 0 0  Trouble relaxing 0 0 0 0  Restless 0 0 0 0  Easily annoyed or irritable 2 1 0 0  Afraid - awful might happen 1 0 0 0  Total GAD 7 Score 6 2 0 0  Anxiety Difficulty Somewhat difficult  Not difficult at all Not difficult at all    Social History   Tobacco Use   Smoking status: Never    Passive exposure: Yes   Smokeless tobacco: Never  Vaping Use   Vaping status: Never Used  Substance Use Topics   Alcohol use: Yes    Alcohol/week: 2.0 standard drinks of alcohol    Types: 2 Glasses of wine per week    Comment: very rarely   Drug use: No        Objective:   Physical Exam Vitals and nursing note reviewed.  Constitutional:      General: She is not in acute distress. Neck:     Comments: Thyroid  non tender to palpation. No mass or goiter noted.  Cardiovascular:     Rate and Rhythm: Normal rate and regular rhythm.     Heart sounds: Normal heart sounds.  Pulmonary:     Effort: Pulmonary effort is normal.  Breath sounds: Normal breath sounds.  Musculoskeletal:     Cervical back: Neck supple.  Neurological:     Mental Status: She is alert and oriented to person, place, and time.  Psychiatric:        Attention and Perception: Attention and perception normal.        Mood and Affect: Mood is anxious.        Speech: Speech normal.        Behavior: Behavior normal.        Thought Content: Thought content normal.        Cognition and Memory: Cognition normal.        Judgment: Judgment normal.    MDQ scores positive for bipolar disorder. See scanned report.   Today's Vitals   03/30/24 1130  BP: 119/74  Pulse: 71  SpO2: 96%  Weight: 195 lb 2 oz (88.5 kg)  Height: 5' (1.524 m)   Body mass index is 38.11 kg/m.         Assessment & Plan:  1. Bipolar disorder, current episode mixed, mild (HCC) (Primary) - Increased Abilify  to 10 mg daily. Monitor for weight gain and EPS. - Reduce Abilify  to 5 mg if side effects occur and notify provider. - Consider Vraylar if no improvement with increased Abilify .  2. Anxiety and depression Anxiety and depression worsened, likely seasonal affective disorder. Abilify  effective, no significant side effects. No self-harm ideation. Sleep disrupted by night shifts. Emotional eating linked to anxiety. Increase Abilify  to 10 mg daily. Contact office in a few weeks to let us  know about effectiveness.   3. Seasonal affective disorder Discussed diagnosis based on symptoms with season change and no other triggers.   4. Elevated ALT measurement Recheck ALT.  - Hepatic function panel  Return in about 3 months (around 06/30/2024).

## 2024-04-13 ENCOUNTER — Encounter: Payer: Self-pay | Admitting: Nurse Practitioner

## 2024-04-16 ENCOUNTER — Other Ambulatory Visit: Payer: Self-pay | Admitting: Nurse Practitioner

## 2024-04-16 DIAGNOSIS — F32A Depression, unspecified: Secondary | ICD-10-CM

## 2024-04-16 MED ORDER — ARIPIPRAZOLE 10 MG PO TABS
10.0000 mg | ORAL_TABLET | Freq: Every day | ORAL | 1 refills | Status: AC
  Filled 2024-04-16: qty 90, 90d supply, fill #0

## 2024-04-16 MED ORDER — BUSPIRONE HCL 10 MG PO TABS
ORAL_TABLET | ORAL | 1 refills | Status: AC
  Filled 2024-04-16: qty 180, 90d supply, fill #0

## 2024-04-17 ENCOUNTER — Other Ambulatory Visit (HOSPITAL_BASED_OUTPATIENT_CLINIC_OR_DEPARTMENT_OTHER): Payer: Self-pay

## 2024-04-19 ENCOUNTER — Ambulatory Visit: Admitting: Family Medicine

## 2024-04-24 NOTE — Progress Notes (Unsigned)
 [facility] [address], [place] [address]   CLINIC:  Medical Oncology/Hematology  PCP:  Cook, Jayce G, DO [address] [place] [address] [phone]   REASON FOR VISIT:  Follow-up for iron  deficiency anemia  CURRENT THERAPY: Intermittent IV iron   INTERVAL HISTORY:   Patricia Becker 48 y.o. female returns for routine follow-up of iron  deficiency anemia.   She was last seen by Pleasant Barefoot PA-C on 10/25/2023.  She contacted clinic in October 2025 to report some increased symptoms of fatigue, but repeat labs showed ferritin 179 with iron  saturation 34%, therefore no additional IV iron  was given.  At today's visit, she reports feeling fairly well. She has 100% energy and 100% appetite.  She endorses that she is maintaining a stable weight.  Her restless legs and paresthesias in hands are resolved for now. She denies any pica, headaches, chest pain, dyspnea on exertion, or lightheadedness. She continues to have moderate menstrual bleeding lasting 5 out of every 28 days, but her periods have been more irregular lately and she wonders if she is perimenopausal.  She denies any rectal bleeding or melena, but reports that stool is dark green from iron  pill.  ASSESSMENT & PLAN:  1.  Severe iron  deficiency anemia: - Iron  deficiency state caused by excessive menstrual bleeding, complicated by Xarelto  (June-October 2024 for treatment of distal DVT) - IUD from October 2024 through January 2025.  Since removal of IUD in January 2025, periods have been 5 out of every 28 days with moderate bleeding - No BRBPR/melena. - Started taking iron  tablet daily in September 2024.  Tolerating this well with stool softener. - Most recent IV iron  with Venofer  400 mg x 1 on 08/31/2023.  Tolerates IV iron  well with PREMEDICATION.   - Fatigue paresthesias, and restless legs resolved after IV iron .   - Most recent labs (04/25/2024): Hgb 13.8, ferritin 111, iron  saturation  23% - PLAN: No IV iron  needed.  Recommend continuing daily iron  supplement and rechecking iron  panel in 6 months.  She can return earlier for repeat labs if she has any recurrent symptoms of iron  deficiency.  2.  Provoked right posterior tibial/peroneal vein DVT: - Patient reported right calf pain s/p right Achilles tendon surgery on 09/17/2022 - Vascular US  right lower extremity (11/02/2022) was consistent with acute DVT involving right posterior tibial veins and right peroneal veins. - Treated with Xarelto  x 3 months   3.  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  - Mother and sister have anemia. Maternal grandmother had ovarian cancer.   PLAN SUMMARY:  >> Same day labs (CBC/D, ferritin, iron /TIBC) + OFFICE visit in 6 months (MONDAY or TUESDAY only)     REVIEW OF SYSTEMS:   Review of Systems  Constitutional:  Negative for appetite change, chills, diaphoresis, fatigue, fever and unexpected weight change.  HENT:   Negative for lump/mass and nosebleeds.   Eyes:  Negative for eye problems.  Respiratory:  Negative for cough, hemoptysis and shortness of breath.   Cardiovascular:  Negative for chest pain, leg swelling and palpitations.  Gastrointestinal:  Negative for abdominal pain, blood in stool, constipation, diarrhea, nausea and vomiting.  Genitourinary:  Negative for hematuria.   Skin: Negative.   Neurological:  Negative for dizziness, headaches, light-headedness and numbness.  Hematological:  Does not bruise/bleed easily.  Psychiatric/Behavioral:  Negative for depression. The patient is not nervous/anxious.      PHYSICAL EXAM:  ECOG PERFORMANCE STATUS: 1 -  Symptomatic but completely ambulatory  Vitals:   04/25/24 0832  BP: (!) 117/58  Pulse: (!) 58  Resp: 17  Temp: (!) 97.3 F (36.3 C)  SpO2: 100%    Filed Weights   04/25/24 0832  Weight: 193 lb (87.5 kg)    Physical Exam Constitutional:      Appearance: Normal appearance. She is obese.  Cardiovascular:      Heart sounds: Normal heart sounds.  Pulmonary:     Breath sounds: Normal breath sounds.  Neurological:     General: No focal deficit present.     Mental Status: Mental status is at baseline.  Psychiatric:        Behavior: Behavior normal. Behavior is cooperative.    PAST MEDICAL/SURGICAL HISTORY:  Past Medical History:  Diagnosis Date   Adult BMI 40.0-44.9 kg/sq m (HCC) 03/01/2023   Anxiety    Anxiety    Depression    Depression    Diverticulosis    DVT (deep venous thrombosis) (HCC) 10/2022   right leg  patient was on xarelto  for 3 months   Encounter for screening for HIV 07/08/2017   GERD (gastroesophageal reflux disease)    Hiatal hernia    Iron  deficiency anemia    Polypharmacy 07/08/2017   PONV (postoperative nausea and vomiting)    Past Surgical History:  Procedure Laterality Date   ANKLE SURGERY Right 09/17/2022   CESAREAN SECTION     COLONOSCOPY     2017, normal.    ESOPHAGOGASTRODUODENOSCOPY     KNEE SURGERY  1981   lap coley  04/2023   TUBAL LIGATION      SOCIAL HISTORY:  Social History   Socioeconomic History   Marital status: Married    Spouse name: Dwayne   Number of children: 3   Years of education: Not on file   Highest education level: Professional school degree (e.g., MD, DDS, DVM, JD)  Occupational History   Not on file  Tobacco Use   Smoking status: Never    Passive exposure: Yes   Smokeless tobacco: Never  Vaping Use   Vaping status: Never Used  Substance and Sexual Activity   Alcohol use: Yes    Alcohol/week: 2.0 standard drinks of alcohol    Types: 2 Glasses of wine per week    Comment: very rarely   Drug use: No   Sexual activity: Yes    Birth control/protection: Surgical    Comment: tubal  Other Topics Concern   Not on file  Social History Narrative   Moved from [place], [place] in 01/2017. Had lived there for over 20 years. Moved with husband, son, and daughter.       Daughter is 69 y/o-married, expecting and lives in  [place]   Daughter is 95 y/o married, has 2 children: 3 and 1 y/o living with her right now   Son is 73 y/o.      Daugther is 15 y/o- husband daughter-step daughter. Lives with her mother now.      Enjoys walking trials, and working out when she could, netflix and chill, and family      Diet: reports she use to meal prep, but has stopped, is supplementing with green powders, meats, fruits   Caffeine: 20 oz 3-4 time a week, no soda, tea- at times mixed sweet and unsweet   Water : 5-8 cups daily      Wears seatbelt.    Wears sunscreen.   Smoke detectors at home.   Does not use phone  while driving.   Social Drivers of Health   Tobacco Use: Medium Risk (03/31/2024)   Patient History    Smoking Tobacco Use: Never    Smokeless Tobacco Use: Never    Passive Exposure: Yes  Financial Resource Strain: Low Risk (03/30/2024)   Overall Financial Resource Strain (CARDIA)    Difficulty of Paying Living Expenses: Not very hard  Food Insecurity: No Food Insecurity (03/30/2024)   Epic    Worried About Programme Researcher, Broadcasting/film/video in the Last Year: Never true    Ran Out of Food in the Last Year: Never true  Transportation Needs: No Transportation Needs (03/30/2024)   Epic    Lack of Transportation (Medical): No    Lack of Transportation (Non-Medical): No  Physical Activity: Insufficiently Active (03/30/2024)   Exercise Vital Sign    Days of Exercise per Week: 3 days    Minutes of Exercise per Session: 30 min  Stress: No Stress Concern Present (03/30/2024)   [employer] of Occupational Health - Occupational Stress Questionnaire    Feeling of Stress: Only a little  Social Connections: Moderately Integrated (03/30/2024)   Social Connection and Isolation Panel    Frequency of Communication with Friends and Family: More than three times a week    Frequency of Social Gatherings with Friends and Family: Once a week    Attends Religious Services: 1 to 4 times per year    Active Member of [employer] or  Organizations: No    Attends Banker Meetings: Not on file    Marital Status: Married  Intimate Partner Violence: Not At Risk (02/21/2024)   Epic    Fear of Current or Ex-Partner: No    Emotionally Abused: No    Physically Abused: No    Sexually Abused: No  Depression (PHQ2-9): Medium Risk (03/30/2024)   Depression (PHQ2-9)    PHQ-2 Score: 10  Alcohol Screen: Low Risk (03/30/2024)   Alcohol Screen    Last Alcohol Screening Score (AUDIT): 1  Housing: High Risk (03/30/2024)   Epic    Unable to Pay for Housing in the Last Year: Yes    Number of Times Moved in the Last Year: 0    Homeless in the Last Year: No  Utilities: Not At Risk (02/21/2024)   Epic    Threatened with loss of utilities: No  Health Literacy: Not on file    FAMILY HISTORY:  Family History  Problem Relation Age of Onset   Colon polyps Mother    Suicidality Father    Hypertension Sister    Colon cancer Maternal Grandmother    Cancer Maternal Grandmother    Migraines Daughter    Drug abuse Other     CURRENT MEDICATIONS:  Outpatient Encounter Medications as of 04/25/2024  Medication Sig Note   acetaminophen  (TYLENOL ) 500 MG tablet Take 2 tablets (1,000 mg total) by mouth every 6 (six) hours. 06/01/2023: Bid    ARIPiprazole  (ABILIFY ) 10 MG tablet Take 1 tablet (10 mg total) by mouth daily.    buPROPion  (WELLBUTRIN  XL) 300 MG 24 hr tablet Take 1 tablet (300 mg total) by mouth daily.    busPIRone  (BUSPAR ) 10 MG tablet TAKE 1 TABLET(10 MG) BY MOUTH TWICE DAILY    docusate sodium (COLACE) 100 MG capsule Take 100 mg by mouth daily.    Ferrous Sulfate (IRON  PO) Take 325 mg by mouth daily.    hydrOXYzine  (VISTARIL ) 25 MG capsule Take 1 capsule (25 mg total) by mouth every 8 (eight) hours  as needed for anxiety.    Multiple Vitamin (MULTIVITAMIN) tablet Take 1 tablet by mouth daily.    omeprazole  (PRILOSEC) 40 MG capsule Take 1 capsule (40 mg total) by mouth daily.    VITAMIN D  PO Take 2,000 Units by  mouth daily.    No facility-administered encounter medications on file as of 04/25/2024.    ALLERGIES:  Allergies  Allergen Reactions   Demerol [Meperidine Hcl] Anaphylaxis   Aspirin Other (See Comments)    Upset stomach   Nsaids     Pt reports has had gi issues lately and was told not to have steroid or NSAIDS prescribed at this time.    LABORATORY DATA:  I have reviewed the labs as listed.  CBC    Component Value Date/Time   WBC 6.4 04/25/2024 0740   RBC 4.51 04/25/2024 0740   HGB 13.8 04/25/2024 0740   HGB 9.4 (L) 01/08/2023 1557   HCT 43.2 04/25/2024 0740   HCT 30.5 (L) 01/08/2023 1557   PLT 260 04/25/2024 0740   PLT 327 01/08/2023 1557   MCV 95.8 04/25/2024 0740   MCV 79 01/08/2023 1557   MCH 30.6 04/25/2024 0740   MCHC 31.9 04/25/2024 0740   RDW 13.1 04/25/2024 0740   RDW 13.6 01/08/2023 1557   LYMPHSABS 2.5 04/25/2024 0740   LYMPHSABS 1.9 01/08/2023 1557   MONOABS 0.6 04/25/2024 0740   EOSABS 0.2 04/25/2024 0740   EOSABS 0.1 01/08/2023 1557   BASOSABS 0.1 04/25/2024 0740   BASOSABS 0.1 01/08/2023 1557      Latest Ref Rng & Units 12/30/2023    9:12 AM 02/23/2023    9:01 AM 01/08/2023    3:57 PM  CMP  Glucose 70 - 99 mg/dL 81   91   BUN 6 - 24 mg/dL 16   9   Creatinine 9.42 - 1.00 mg/dL 9.07   9.04   Sodium 865 - 144 mmol/L 144   139   Potassium 3.5 - 5.2 mmol/L 4.0   4.3   Chloride 96 - 106 mmol/L 107   104   CO2 20 - 29 mmol/L 24   23   Calcium 8.7 - 10.2 mg/dL 9.3   9.0   Total Protein 6.0 - 8.5 g/dL 6.1  6.1  6.4   Total Bilirubin 0.0 - 1.2 mg/dL 0.4  <9.7  0.3   Alkaline Phos 44 - 121 IU/L 79  94  85   AST 0 - 40 IU/L 23  12  19    ALT 0 - 32 IU/L 54  23  32     DIAGNOSTIC IMAGING:  I have independently reviewed the relevant imaging and discussed with the patient.   WRAP UP:  All questions were answered. The patient knows to call the clinic with any problems, questions or concerns.  Medical decision making: Low  Time spent on visit: I  spent 15 minutes counseling the patient face to face. The total time spent in the appointment was 22 minutes and more than 50% was on counseling.  Pleasant CHRISTELLA Barefoot, PA-C  04/25/2024 9:13 AM

## 2024-04-25 ENCOUNTER — Inpatient Hospital Stay: Attending: Physician Assistant | Admitting: Physician Assistant

## 2024-04-25 ENCOUNTER — Inpatient Hospital Stay

## 2024-04-25 VITALS — BP 117/58 | HR 58 | Temp 97.3°F | Resp 17 | Ht 60.0 in | Wt 193.0 lb

## 2024-04-25 DIAGNOSIS — Z800 Family history of malignant neoplasm of digestive organs: Secondary | ICD-10-CM | POA: Insufficient documentation

## 2024-04-25 DIAGNOSIS — Z7901 Long term (current) use of anticoagulants: Secondary | ICD-10-CM | POA: Diagnosis not present

## 2024-04-25 DIAGNOSIS — D500 Iron deficiency anemia secondary to blood loss (chronic): Secondary | ICD-10-CM

## 2024-04-25 DIAGNOSIS — Z8041 Family history of malignant neoplasm of ovary: Secondary | ICD-10-CM | POA: Diagnosis not present

## 2024-04-25 DIAGNOSIS — Z86718 Personal history of other venous thrombosis and embolism: Secondary | ICD-10-CM | POA: Diagnosis not present

## 2024-04-25 DIAGNOSIS — N920 Excessive and frequent menstruation with regular cycle: Secondary | ICD-10-CM | POA: Insufficient documentation

## 2024-04-25 LAB — CBC WITH DIFFERENTIAL/PLATELET
Abs Immature Granulocytes: 0.02 K/uL (ref 0.00–0.07)
Basophils Absolute: 0.1 K/uL (ref 0.0–0.1)
Basophils Relative: 1 %
Eosinophils Absolute: 0.2 K/uL (ref 0.0–0.5)
Eosinophils Relative: 2 %
HCT: 43.2 % (ref 36.0–46.0)
Hemoglobin: 13.8 g/dL (ref 12.0–15.0)
Immature Granulocytes: 0 %
Lymphocytes Relative: 39 %
Lymphs Abs: 2.5 K/uL (ref 0.7–4.0)
MCH: 30.6 pg (ref 26.0–34.0)
MCHC: 31.9 g/dL (ref 30.0–36.0)
MCV: 95.8 fL (ref 80.0–100.0)
Monocytes Absolute: 0.6 K/uL (ref 0.1–1.0)
Monocytes Relative: 9 %
Neutro Abs: 3.1 K/uL (ref 1.7–7.7)
Neutrophils Relative %: 49 %
Platelets: 260 K/uL (ref 150–400)
RBC: 4.51 MIL/uL (ref 3.87–5.11)
RDW: 13.1 % (ref 11.5–15.5)
WBC: 6.4 K/uL (ref 4.0–10.5)
nRBC: 0 % (ref 0.0–0.2)

## 2024-04-25 LAB — IRON AND TIBC
Iron: 63 ug/dL (ref 28–170)
Saturation Ratios: 23 % (ref 10.4–31.8)
TIBC: 277 ug/dL (ref 250–450)
UIBC: 215 ug/dL

## 2024-04-25 LAB — FERRITIN: Ferritin: 111 ng/mL (ref 11–307)

## 2024-04-25 NOTE — Patient Instructions (Signed)
 [facility] at [facility] **VISIT SUMMARY & IMPORTANT INSTRUCTIONS **   You were seen today by Pleasant Barefoot PA-C for your iron  deficiency anemia.   Your blood and iron  levels look great! You do not need any IV iron  at this time. Continue your daily iron  pill. Reach out sooner if you have any symptoms of recurrent iron  deficiency.  FOLLOW-UP APPOINTMENT: Same day labs and office visit in 6 months  ** Thank you for trusting me with your healthcare!  I strive to provide all of my patients with quality care at each visit.  If you receive a survey for this visit, I would be so grateful to you for taking the time to provide feedback.  Thank you in advance!  ~ Bertran Zeimet                                        Dr. Mickiel Davonna Pleasant Barefoot, PA-C       Delon Hope, NP   - - - - - - - - - - - - - - - - - -     Thank you for choosing [facility] at [facility] to provide your oncology and hematology care.  To afford each patient quality time with our provider, please arrive at least 15 minutes before your scheduled appointment time.   If you have a lab appointment with the Cancer Center please come in thru the Main Entrance and check in at the main information desk.  You need to re-schedule your appointment should you arrive 10 or more minutes late.  We strive to give you quality time with our providers, and arriving late affects you and other patients whose appointments are after yours.  Also, if you no show three or more times for appointments you may be dismissed from the clinic at the providers discretion.     Again, thank you for choosing [facility]  Our hope is that these requests will decrease the amount of time that you wait before being seen by our physicians.       _____________________________________________________________  Should you have questions after your visit to [facility], please contact  our office at [phone] and follow the prompts.  Our office hours are 8:00 a.m. and 4:30 p.m. Monday - Friday.  Please note that voicemails left after 4:00 p.m. may not be returned until the following business day.  We are closed weekends and major holidays.  You do have access to a nurse 24-7, just call the main number to the clinic [phone] and do not press any options, hold on the line and a nurse will answer the phone.    For prescription refill requests, have your pharmacy contact our office and allow 72 hours.

## 2024-04-26 ENCOUNTER — Other Ambulatory Visit (HOSPITAL_BASED_OUTPATIENT_CLINIC_OR_DEPARTMENT_OTHER): Payer: Self-pay

## 2024-04-26 MED FILL — Omeprazole Cap Delayed Release 40 MG: ORAL | 60 days supply | Qty: 60 | Fill #0 | Status: AC

## 2024-04-28 ENCOUNTER — Other Ambulatory Visit (HOSPITAL_BASED_OUTPATIENT_CLINIC_OR_DEPARTMENT_OTHER): Payer: Self-pay

## 2024-05-08 ENCOUNTER — Encounter: Payer: Self-pay | Admitting: *Deleted

## 2024-06-27 ENCOUNTER — Ambulatory Visit: Admitting: Nurse Practitioner

## 2024-07-06 ENCOUNTER — Ambulatory Visit: Admitting: Nurse Practitioner

## 2024-10-24 ENCOUNTER — Inpatient Hospital Stay

## 2024-10-24 ENCOUNTER — Inpatient Hospital Stay: Admitting: Physician Assistant
